# Patient Record
Sex: Male | Born: 1950 | Race: White | Hispanic: No | Marital: Married | Smoking: Former smoker
Health system: Southern US, Community
[De-identification: ages and names within clinical notes are randomized; demographics above are authoritative.]

## PROBLEM LIST (undated history)

## (undated) DIAGNOSIS — E785 Hyperlipidemia, unspecified: Secondary | ICD-10-CM

## (undated) DIAGNOSIS — E119 Type 2 diabetes mellitus without complications: Secondary | ICD-10-CM

## (undated) HISTORY — DX: Hyperlipidemia, unspecified: E78.5

## (undated) HISTORY — DX: Type 2 diabetes mellitus without complications: E11.9

---

## 2002-08-25 ENCOUNTER — Encounter: Admission: RE | Admit: 2002-08-25 | Discharge: 2002-11-23 | Payer: Self-pay | Admitting: Family Medicine

## 2002-11-11 ENCOUNTER — Encounter: Admission: RE | Admit: 2002-11-11 | Discharge: 2003-02-09 | Payer: Self-pay | Admitting: Family Medicine

## 2003-09-06 ENCOUNTER — Encounter: Admission: RE | Admit: 2003-09-06 | Discharge: 2003-12-05 | Payer: Self-pay | Admitting: Family Medicine

## 2004-08-03 ENCOUNTER — Ambulatory Visit (HOSPITAL_COMMUNITY): Admission: RE | Admit: 2004-08-03 | Discharge: 2004-08-03 | Payer: Self-pay | Admitting: Gastroenterology

## 2015-01-02 DIAGNOSIS — Z0000 Encounter for general adult medical examination without abnormal findings: Secondary | ICD-10-CM | POA: Diagnosis not present

## 2015-01-02 DIAGNOSIS — M47817 Spondylosis without myelopathy or radiculopathy, lumbosacral region: Secondary | ICD-10-CM | POA: Diagnosis not present

## 2015-01-02 DIAGNOSIS — M2578 Osteophyte, vertebrae: Secondary | ICD-10-CM | POA: Diagnosis not present

## 2015-01-02 DIAGNOSIS — B070 Plantar wart: Secondary | ICD-10-CM | POA: Diagnosis not present

## 2015-01-02 DIAGNOSIS — Z23 Encounter for immunization: Secondary | ICD-10-CM | POA: Diagnosis not present

## 2015-01-02 DIAGNOSIS — Z125 Encounter for screening for malignant neoplasm of prostate: Secondary | ICD-10-CM | POA: Diagnosis not present

## 2015-01-02 DIAGNOSIS — I10 Essential (primary) hypertension: Secondary | ICD-10-CM | POA: Diagnosis not present

## 2015-01-02 DIAGNOSIS — L989 Disorder of the skin and subcutaneous tissue, unspecified: Secondary | ICD-10-CM | POA: Diagnosis not present

## 2015-01-02 DIAGNOSIS — E780 Pure hypercholesterolemia: Secondary | ICD-10-CM | POA: Diagnosis not present

## 2015-01-02 DIAGNOSIS — M545 Low back pain: Secondary | ICD-10-CM | POA: Diagnosis not present

## 2015-01-10 DIAGNOSIS — H2513 Age-related nuclear cataract, bilateral: Secondary | ICD-10-CM | POA: Diagnosis not present

## 2015-01-10 DIAGNOSIS — H02834 Dermatochalasis of left upper eyelid: Secondary | ICD-10-CM | POA: Diagnosis not present

## 2015-01-10 DIAGNOSIS — H02831 Dermatochalasis of right upper eyelid: Secondary | ICD-10-CM | POA: Diagnosis not present

## 2015-01-10 DIAGNOSIS — E119 Type 2 diabetes mellitus without complications: Secondary | ICD-10-CM | POA: Diagnosis not present

## 2015-01-16 DIAGNOSIS — H02834 Dermatochalasis of left upper eyelid: Secondary | ICD-10-CM | POA: Diagnosis not present

## 2015-01-16 DIAGNOSIS — H02831 Dermatochalasis of right upper eyelid: Secondary | ICD-10-CM | POA: Diagnosis not present

## 2015-01-17 DIAGNOSIS — L820 Inflamed seborrheic keratosis: Secondary | ICD-10-CM | POA: Diagnosis not present

## 2015-01-17 DIAGNOSIS — D485 Neoplasm of uncertain behavior of skin: Secondary | ICD-10-CM | POA: Diagnosis not present

## 2015-01-17 DIAGNOSIS — D235 Other benign neoplasm of skin of trunk: Secondary | ICD-10-CM | POA: Diagnosis not present

## 2015-01-17 DIAGNOSIS — L821 Other seborrheic keratosis: Secondary | ICD-10-CM | POA: Diagnosis not present

## 2015-01-17 DIAGNOSIS — L570 Actinic keratosis: Secondary | ICD-10-CM | POA: Diagnosis not present

## 2015-01-23 DIAGNOSIS — B070 Plantar wart: Secondary | ICD-10-CM | POA: Diagnosis not present

## 2015-01-23 DIAGNOSIS — E1140 Type 2 diabetes mellitus with diabetic neuropathy, unspecified: Secondary | ICD-10-CM | POA: Diagnosis not present

## 2015-02-06 DIAGNOSIS — B070 Plantar wart: Secondary | ICD-10-CM | POA: Diagnosis not present

## 2015-02-10 DIAGNOSIS — H02834 Dermatochalasis of left upper eyelid: Secondary | ICD-10-CM | POA: Diagnosis not present

## 2015-02-10 DIAGNOSIS — H2513 Age-related nuclear cataract, bilateral: Secondary | ICD-10-CM | POA: Diagnosis not present

## 2015-02-10 DIAGNOSIS — H02831 Dermatochalasis of right upper eyelid: Secondary | ICD-10-CM | POA: Diagnosis not present

## 2015-02-15 DIAGNOSIS — M47816 Spondylosis without myelopathy or radiculopathy, lumbar region: Secondary | ICD-10-CM | POA: Diagnosis not present

## 2015-02-15 DIAGNOSIS — M5136 Other intervertebral disc degeneration, lumbar region: Secondary | ICD-10-CM | POA: Diagnosis not present

## 2015-02-15 DIAGNOSIS — M4004 Postural kyphosis, thoracic region: Secondary | ICD-10-CM | POA: Diagnosis not present

## 2015-02-15 DIAGNOSIS — M545 Low back pain: Secondary | ICD-10-CM | POA: Diagnosis not present

## 2015-02-21 DIAGNOSIS — D235 Other benign neoplasm of skin of trunk: Secondary | ICD-10-CM | POA: Diagnosis not present

## 2015-02-28 DIAGNOSIS — Z48817 Encounter for surgical aftercare following surgery on the skin and subcutaneous tissue: Secondary | ICD-10-CM | POA: Diagnosis not present

## 2015-02-28 DIAGNOSIS — D229 Melanocytic nevi, unspecified: Secondary | ICD-10-CM | POA: Diagnosis not present

## 2015-03-02 DIAGNOSIS — Z48817 Encounter for surgical aftercare following surgery on the skin and subcutaneous tissue: Secondary | ICD-10-CM | POA: Diagnosis not present

## 2015-03-07 DIAGNOSIS — E1165 Type 2 diabetes mellitus with hyperglycemia: Secondary | ICD-10-CM | POA: Diagnosis not present

## 2015-03-07 DIAGNOSIS — E108 Type 1 diabetes mellitus with unspecified complications: Secondary | ICD-10-CM | POA: Diagnosis not present

## 2015-03-09 DIAGNOSIS — H02403 Unspecified ptosis of bilateral eyelids: Secondary | ICD-10-CM | POA: Diagnosis not present

## 2015-03-09 DIAGNOSIS — H02834 Dermatochalasis of left upper eyelid: Secondary | ICD-10-CM | POA: Diagnosis not present

## 2015-03-09 DIAGNOSIS — H02831 Dermatochalasis of right upper eyelid: Secondary | ICD-10-CM | POA: Diagnosis not present

## 2015-06-09 DIAGNOSIS — J0100 Acute maxillary sinusitis, unspecified: Secondary | ICD-10-CM | POA: Diagnosis not present

## 2015-06-15 DIAGNOSIS — J302 Other seasonal allergic rhinitis: Secondary | ICD-10-CM | POA: Diagnosis not present

## 2015-09-04 DIAGNOSIS — E1165 Type 2 diabetes mellitus with hyperglycemia: Secondary | ICD-10-CM | POA: Diagnosis not present

## 2015-09-04 DIAGNOSIS — E118 Type 2 diabetes mellitus with unspecified complications: Secondary | ICD-10-CM | POA: Diagnosis not present

## 2015-09-04 DIAGNOSIS — E782 Mixed hyperlipidemia: Secondary | ICD-10-CM | POA: Diagnosis not present

## 2015-09-04 DIAGNOSIS — I10 Essential (primary) hypertension: Secondary | ICD-10-CM | POA: Diagnosis not present

## 2015-09-12 DIAGNOSIS — E118 Type 2 diabetes mellitus with unspecified complications: Secondary | ICD-10-CM | POA: Diagnosis not present

## 2015-09-25 DIAGNOSIS — Z23 Encounter for immunization: Secondary | ICD-10-CM | POA: Diagnosis not present

## 2015-09-26 DIAGNOSIS — Z872 Personal history of diseases of the skin and subcutaneous tissue: Secondary | ICD-10-CM | POA: Diagnosis not present

## 2015-09-26 DIAGNOSIS — Z09 Encounter for follow-up examination after completed treatment for conditions other than malignant neoplasm: Secondary | ICD-10-CM | POA: Diagnosis not present

## 2015-09-26 DIAGNOSIS — L570 Actinic keratosis: Secondary | ICD-10-CM | POA: Diagnosis not present

## 2015-09-26 DIAGNOSIS — X32XXXA Exposure to sunlight, initial encounter: Secondary | ICD-10-CM | POA: Diagnosis not present

## 2015-09-26 DIAGNOSIS — D225 Melanocytic nevi of trunk: Secondary | ICD-10-CM | POA: Diagnosis not present

## 2015-11-30 DIAGNOSIS — J018 Other acute sinusitis: Secondary | ICD-10-CM | POA: Diagnosis not present

## 2015-12-06 DIAGNOSIS — Z794 Long term (current) use of insulin: Secondary | ICD-10-CM | POA: Diagnosis not present

## 2015-12-06 DIAGNOSIS — E118 Type 2 diabetes mellitus with unspecified complications: Secondary | ICD-10-CM | POA: Diagnosis not present

## 2015-12-06 DIAGNOSIS — E1165 Type 2 diabetes mellitus with hyperglycemia: Secondary | ICD-10-CM | POA: Diagnosis not present

## 2016-03-18 DIAGNOSIS — E1165 Type 2 diabetes mellitus with hyperglycemia: Secondary | ICD-10-CM | POA: Diagnosis not present

## 2016-03-18 DIAGNOSIS — I10 Essential (primary) hypertension: Secondary | ICD-10-CM | POA: Diagnosis not present

## 2016-03-18 DIAGNOSIS — E785 Hyperlipidemia, unspecified: Secondary | ICD-10-CM | POA: Diagnosis not present

## 2016-03-18 DIAGNOSIS — Z794 Long term (current) use of insulin: Secondary | ICD-10-CM | POA: Diagnosis not present

## 2016-03-18 DIAGNOSIS — E118 Type 2 diabetes mellitus with unspecified complications: Secondary | ICD-10-CM | POA: Diagnosis not present

## 2016-06-18 DIAGNOSIS — I10 Essential (primary) hypertension: Secondary | ICD-10-CM | POA: Diagnosis not present

## 2016-06-18 DIAGNOSIS — E784 Other hyperlipidemia: Secondary | ICD-10-CM | POA: Diagnosis not present

## 2016-06-18 DIAGNOSIS — Z794 Long term (current) use of insulin: Secondary | ICD-10-CM | POA: Diagnosis not present

## 2016-06-18 DIAGNOSIS — E1165 Type 2 diabetes mellitus with hyperglycemia: Secondary | ICD-10-CM | POA: Diagnosis not present

## 2016-06-18 DIAGNOSIS — E118 Type 2 diabetes mellitus with unspecified complications: Secondary | ICD-10-CM | POA: Diagnosis not present

## 2016-07-29 DIAGNOSIS — M5432 Sciatica, left side: Secondary | ICD-10-CM | POA: Diagnosis not present

## 2016-07-29 DIAGNOSIS — E119 Type 2 diabetes mellitus without complications: Secondary | ICD-10-CM | POA: Diagnosis not present

## 2016-08-01 ENCOUNTER — Encounter: Payer: Self-pay | Admitting: Internal Medicine

## 2016-08-05 DIAGNOSIS — M5432 Sciatica, left side: Secondary | ICD-10-CM | POA: Diagnosis not present

## 2016-08-17 DIAGNOSIS — M5136 Other intervertebral disc degeneration, lumbar region: Secondary | ICD-10-CM | POA: Diagnosis not present

## 2016-08-17 DIAGNOSIS — M5416 Radiculopathy, lumbar region: Secondary | ICD-10-CM | POA: Diagnosis not present

## 2016-09-10 ENCOUNTER — Encounter: Payer: Self-pay | Admitting: Endocrinology

## 2016-09-10 ENCOUNTER — Ambulatory Visit (INDEPENDENT_AMBULATORY_CARE_PROVIDER_SITE_OTHER): Payer: Medicare Other | Admitting: Endocrinology

## 2016-09-10 VITALS — BP 136/62 | HR 87 | Ht 71.0 in | Wt 181.0 lb

## 2016-09-10 DIAGNOSIS — E1142 Type 2 diabetes mellitus with diabetic polyneuropathy: Secondary | ICD-10-CM

## 2016-09-10 DIAGNOSIS — E119 Type 2 diabetes mellitus without complications: Secondary | ICD-10-CM | POA: Insufficient documentation

## 2016-09-10 DIAGNOSIS — Z23 Encounter for immunization: Secondary | ICD-10-CM

## 2016-09-10 LAB — POCT GLYCOSYLATED HEMOGLOBIN (HGB A1C): Hemoglobin A1C: 9.6

## 2016-09-10 MED ORDER — METFORMIN HCL 1000 MG PO TABS: 1000.0000 mg | ORAL_TABLET | Freq: Two times a day (BID) | ORAL | 11 refills | Status: DC

## 2016-09-10 MED ORDER — INVOKANA 300 MG PO TABS: 300.0000 mg | ORAL_TABLET | Freq: Every day | ORAL | 11 refills | Status: DC

## 2016-09-10 MED ORDER — GLYBURIDE 5 MG PO TABS: 10.0000 mg | ORAL_TABLET | Freq: Two times a day (BID) | ORAL | 11 refills | Status: DC

## 2016-09-10 NOTE — Patient Instructions (Addendum)
good diet and exercise significantly improve the control of your diabetes.  please let me know if you wish to be referred to a dietician.  high blood sugar is very risky to your health.  you should see an eye doctor and dentist every year.  It is very important to get all recommended vaccinations.  Controlling your blood pressure and cholesterol drastically reduces the damage diabetes does to your body.  Those who smoke should quit.  Please discuss these with your doctor.  check your blood sugar once a day.  vary the time of day when you check, between before the 3 meals, and at bedtime.  also check if you have symptoms of your blood sugar being too high or too low.  please keep a record of the readings and bring it to your next appointment here (or you can bring the meter itself).  You can write it on any piece of paper.  please call us sooner if your blood sugar goes below 70, or if you have a lot of readings over 200.   Please continue the same medications.   Please come back for a follow-up appointment in 2 months.

## 2016-09-10 NOTE — Progress Notes (Signed)
Subjective:    Patient ID: Nathan Duncan, male    DOB: May 01, 1951, 65 y.o.   MRN: [id number]  HPI pt states DM was dx'ed in 1985; he has mild neuropathy of the lower extremities; he is unaware of any associated chronic complications; he has never been on insulin; pt says his diet and exercise are both poor; he has never had pancreatitis or DKA.  He takes 3 oral meds. He brings a record of his cbg's which I have reviewed today, checked fasting only.  It varies from 148-202.  He has had 1 episode of severe hypoglycemia (1980's)--does not know what med or meds he was taking then.   Past Medical History:  Diagnosis Date  . Diabetes ([place])     No past surgical history on file.  Social History   Social History  . Marital status: Married    Spouse name: N/A  . Number of children: N/A  . Years of education: N/A   Occupational History  . Not on file.   Social History Main Topics  . Smoking status: Former Research scientist (life sciences)  . Smokeless tobacco: Never Used  . Alcohol use Yes  . Drug use: Unknown  . Sexual activity: Not on file   Other Topics Concern  . Not on file   Social History Narrative  . No narrative on file    No current outpatient prescriptions on file prior to visit.   No current facility-administered medications on file prior to visit.     No Known Allergies  Family History  Problem Relation Age of Onset  . Diabetes Mother     BP 136/62   Pulse 87   Ht 5\' 11"  (1.803 m)   Wt 181 lb (82.1 kg)   SpO2 97%   BMI 25.24 kg/m    Review of Systems denies headache, chest pain, sob, n/v, urinary frequency, muscle cramps, excessive diaphoresis, depression, rhinorrhea, and easy bruising.  He has chronic foot pain and mild lightheadedness.  He has lost 50 lbs, x 4 years.  He has mild blurry vision and cold intolerance.      Objective:   Physical Exam VS: see vs page GEN: no distress HEAD: head: no deformity eyes: no periorbital swelling, no proptosis external nose and  ears are normal mouth: no lesion seen NECK: supple, thyroid is not enlarged CHEST WALL: no deformity LUNGS: clear to auscultation CV: reg rate and rhythm, no murmur. ABD: abdomen is soft, nontender.  no hepatosplenomegaly.  not distended.  no hernia MUSCULOSKELETAL: muscle bulk and strength are grossly normal.  no obvious joint swelling.  gait is normal and steady EXTEMITIES: no deformity.  no ulcer on the feet.  feet are of normal color and temp.  no edema PULSES: dorsalis pedis intact bilat.  no carotid bruit NEURO:  cn 2-12 grossly intact.   readily moves all 4's.  sensation is intact to touch on the feet.   SKIN:  Normal texture and temperature.  No rash or suspicious lesion is visible.   NODES:  None palpable at the neck.   PSYCH: alert, well-oriented.  Does not appear anxious nor depressed.    A1c=9.6%     Assessment & Plan:  Type 2 DM, severe exacerbation (although he may be developing type 1).  He declines to start insulin, learn about insulin, or add another oral med today.   Weight loss, new to me: prob due to chronic hyperglycemia: we'll follow Blurry vision: prob due to hyperglycemia: he is advised to  see opthal.

## 2016-09-23 DIAGNOSIS — H524 Presbyopia: Secondary | ICD-10-CM | POA: Diagnosis not present

## 2016-09-23 DIAGNOSIS — H52223 Regular astigmatism, bilateral: Secondary | ICD-10-CM | POA: Diagnosis not present

## 2016-09-23 DIAGNOSIS — H5213 Myopia, bilateral: Secondary | ICD-10-CM | POA: Diagnosis not present

## 2016-09-23 DIAGNOSIS — E103293 Type 1 diabetes mellitus with mild nonproliferative diabetic retinopathy without macular edema, bilateral: Secondary | ICD-10-CM | POA: Diagnosis not present

## 2016-09-23 DIAGNOSIS — H2513 Age-related nuclear cataract, bilateral: Secondary | ICD-10-CM | POA: Diagnosis not present

## 2016-09-23 DIAGNOSIS — H02831 Dermatochalasis of right upper eyelid: Secondary | ICD-10-CM | POA: Diagnosis not present

## 2016-10-07 ENCOUNTER — Encounter: Payer: Self-pay | Admitting: Endocrinology

## 2016-10-07 ENCOUNTER — Other Ambulatory Visit: Payer: Self-pay

## 2016-10-07 MED ORDER — INVOKANA 300 MG PO TABS: 300.0000 mg | ORAL_TABLET | Freq: Every day | ORAL | 1 refills | Status: DC

## 2016-10-07 MED ORDER — GLYBURIDE 5 MG PO TABS: 10.0000 mg | ORAL_TABLET | Freq: Two times a day (BID) | ORAL | 1 refills | Status: DC

## 2016-11-07 ENCOUNTER — Ambulatory Visit (INDEPENDENT_AMBULATORY_CARE_PROVIDER_SITE_OTHER): Payer: Medicare Other | Admitting: Endocrinology

## 2016-11-07 ENCOUNTER — Encounter: Payer: Self-pay | Admitting: Endocrinology

## 2016-11-07 ENCOUNTER — Encounter: Payer: Self-pay | Admitting: Dietician

## 2016-11-07 ENCOUNTER — Encounter: Payer: Medicare Other | Attending: Endocrinology | Admitting: Dietician

## 2016-11-07 VITALS — BP 130/70 | HR 68 | Ht 71.0 in | Wt 180.0 lb

## 2016-11-07 DIAGNOSIS — Z713 Dietary counseling and surveillance: Secondary | ICD-10-CM | POA: Insufficient documentation

## 2016-11-07 DIAGNOSIS — E1142 Type 2 diabetes mellitus with diabetic polyneuropathy: Secondary | ICD-10-CM | POA: Diagnosis not present

## 2016-11-07 LAB — POCT GLYCOSYLATED HEMOGLOBIN (HGB A1C): HEMOGLOBIN A1C: 8.4

## 2016-11-07 NOTE — Patient Instructions (Signed)
Avoid skipping meals. Consider a small snack when you are working in the yard to avoid lows. When you are having a low blood sugar, avoid over treating.  Start with 15 grams of carbohydrates (1/2 cup juice or 2 Tablespoons raisins or 1/2 cup regular soda) then recheck in 15 minutes.  If it is still low then repeat.  When normal then have a snack with protein.  Plan:  Aim for 4 Carb Choices per meal (60 grams) +/- 1 either way  Aim for 0-1 Carbs per snack if hungry  Include protein in moderation with your meals and snacks Consider reading food labels for Total Carbohydrate and Fat Grams of foods Consider  increasing your activity level by walking for 30-60 minutes daily as tolerated Consider checking BG at alternate times per day as directed by MD  Consider taking medication as directed by MD

## 2016-11-07 NOTE — Progress Notes (Signed)
Medical Nutrition Therapy:  Appt start time: 0930 end time:  1050.   Assessment:  Primary concerns today: Patient is here alone.  He is a patient of Dr. Cordelia Pen referred for Type 2 diabetes.  He has had diabetes since he was 89 years ole.  Weight today 180 lbs which is stable and decreased from 245 lbs in the past 1 1/2 years with exercise and diet changes.  His last HgbA1C was 8.4% today which was decreased from 9.6%. 09/10/16.  Patient lives with his wife.  They moved here from [place] 4 months ago.  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  He does the shopping and cooking.    Preferred Learning Style:   No preference indicated   Learning Readiness:   Ready  MEDICATIONS: see list to include gliburide, invokana, and metformin.  He is trying to avoid insulin.   DIETARY INTAKE:  Usual eating pattern includes 3 meals and 2-3 snacks per day.  Everyday foods include stevia.  Avoided foods include sugar.    24-hr recall:  B ( AM): plain cheerios, banana or craisins or grapes, 2% milk OR scrambled eggs, sausage link, white toast or low carb tortilla  OR bagel with cream cheese and salmon Snk ( AM): none OR payday candy bar when he is working outside  L ( PM): sandwich or soup and salad Snk ( PM): water crackers (6) with gouda and herring D ( PM): mixed vegetables and shrimp OR baked or grilled fish, pork chop with salad OR pasta, [place] meatballs, brown gravy Snk ( PM): cottage cheese and fruit, 1 chocolate nugget OR raisinettes  Beverages: water, tea with honey, unsweetened tea, 2 cups 2% milk, coffee  Usual physical activity: working on a lot of projects including a lot of yard work but no formal exercise  Estimated energy needs: 2000 calories 225 g carbohydrates 125 g protein 67 g fat  Progress Towards Goal(s):  In progress.   Nutritional Diagnosis:  NB-1.1 Food and nutrition-related knowledge deficit As related to  balance of carbohydrate, protein, and fat.  As evidenced by patient report and diet hx.    Intervention:  Nutrition counseling/education related to a diabetic diet.  Discussed balance of carbohydrates throughout the day and adding protein to meals and snacks.  Discussed exercise, effects on blood sugar and importance of a snack and not skipping meals to avoid the low and other treatments to avoid over treating the low. Discussed benefits of a varied diet for increased nutrients and specifically need to increase his non starchy vegetable intake.  Avoid skipping meals. Consider a small snack when you are working in the yard to avoid lows. When you are having a low blood sugar, avoid over treating.  Start with 15 grams of carbohydrates (1/2 cup juice or 2 Tablespoons raisins or 1/2 cup regular soda) then recheck in 15 minutes.  If it is still low then repeat.  When normal then have a snack with protein.  Reviewed label reading.  Plan:  Aim for 4 Carb Choices per meal (60 grams) +/- 1 either way  Aim for 0-1 Carbs per snack if hungry  Include protein in moderation with your meals and snacks Consider reading food labels for Total Carbohydrate and Fat Grams of foods Consider  increasing your activity level by walking for 30-60 minutes daily as tolerated Consider checking BG at alternate times per day as directed by MD  Consider taking  medication as directed by MD  Teaching Method Utilized:  Visual Auditory Hands on  Handouts given during visit include:  Living Well with Diabetes  A1C sheet  Meal plan card  Snack list  Barriers to learning/adherence to lifestyle change: none  Demonstrated degree of understanding via:  Teach Back   Monitoring/Evaluation:  Dietary intake, exercise, label reading, and body weight prn.

## 2016-11-07 NOTE — Patient Instructions (Addendum)
check your blood sugar once a day.  vary the time of day when you check, between before the 3 meals, and at bedtime.  also check if you have symptoms of your blood sugar being too high or too low.  please keep a record of the readings and bring it to your next appointment here (or you can bring the meter itself).  You can write it on any piece of paper.  please call us sooner if your blood sugar goes below 70, or if you have a lot of readings over 200.   Please continue the same medications.   Please come back for a follow-up appointment in 3 months.

## 2016-11-07 NOTE — Progress Notes (Signed)
Subjective:    Patient ID: Nathan Duncan, male    DOB: May 10, 1951, 65 y.o.   MRN: [id number]  HPI Pt returns for f/u of diabetes mellitus: DM type: 2 (although he may be developing type 1) Dx'ed: 1985. Complications: none. Therapy: 3 oral meds. DKA: never.  Severe hypoglycemia: once, in the 1980's--he does not know what med or meds he was taking then.  Pancreatitis: never.  Other: he has never been on insulin, and he declines to take.  Interval history: no cbg record, but states cbg's are in the mid-100's.  pt states he feels well in general.  He takes meds as rx'ed.  Past Medical History:  Diagnosis Date  . Diabetes ([place])     No past surgical history on file.  Social History   Social History  . Marital status: Married    Spouse name: N/A  . Number of children: N/A  . Years of education: N/A   Occupational History  . Not on file.   Social History Main Topics  . Smoking status: Former Research scientist (life sciences)  . Smokeless tobacco: Never Used  . Alcohol use Yes  . Drug use: Unknown  . Sexual activity: Not on file   Other Topics Concern  . Not on file   Social History Narrative  . No narrative on file    Current Outpatient Prescriptions on File Prior to Visit  Medication Sig Dispense Refill  . ACCU-CHEK SMARTVIEW test strip     . aspirin EC 81 MG tablet Take 81 mg by mouth daily.    Marland Kitchen atorvastatin (LIPITOR) 40 MG tablet     . glyBURIDE (DIABETA) 5 MG tablet Take 2 tablets (10 mg total) by mouth 2 (two) times daily with a meal. 360 tablet 1  . INVOKANA 300 MG TABS tablet Take 1 tablet (300 mg total) by mouth daily with breakfast. 30 tablet 1  . lisinopril (PRINIVIL,ZESTRIL) 5 MG tablet     . metFORMIN (GLUCOPHAGE) 1000 MG tablet Take 1 tablet (1,000 mg total) by mouth 2 (two) times daily with a meal. 60 tablet 11   No current facility-administered medications on file prior to visit.     No Known Allergies  Family History  Problem Relation Age of Onset  . Diabetes  Mother     BP 130/70   Pulse 68   Ht 5\' 11"  (1.803 m)   Wt 180 lb (81.6 kg)   SpO2 97%   BMI 25.10 kg/m    Review of Systems He has lost 1 more lb since last ov.     Objective:   Physical Exam VITAL SIGNS:  See vs page GENERAL: no distress Pulses: dorsalis pedis intact bilat.   MSK: no deformity of the feet.  CV: no leg edema.  Skin:  no ulcer on the feet.  normal color and temp on the feet.  Neuro: sensation is intact to touch on the feet.   A1c=8.4%    Assessment & Plan:  Type 2 DM: he needs increased rx.  He declines to take insulin, or to add another med.    Patient is advised the following: Patient Instructions  check your blood sugar once a day.  vary the time of day when you check, between before the 3 meals, and at bedtime.  also check if you have symptoms of your blood sugar being too high or too low.  please keep a record of the readings and bring it to your next appointment here (or you can  bring the meter itself).  You can write it on any piece of paper.  please call us sooner if your blood sugar goes below 70, or if you have a lot of readings over 200.   Please continue the same medications.   Please come back for a follow-up appointment in 3 months.

## 2016-11-26 ENCOUNTER — Encounter: Payer: Self-pay | Admitting: Endocrinology

## 2016-11-26 ENCOUNTER — Other Ambulatory Visit: Payer: Self-pay

## 2016-11-26 MED ORDER — ACCU-CHEK SMARTVIEW VI STRP: ORAL_STRIP | 2 refills | Status: DC

## 2017-02-07 ENCOUNTER — Ambulatory Visit: Payer: Medicare Other | Admitting: Endocrinology

## 2017-02-12 DIAGNOSIS — L820 Inflamed seborrheic keratosis: Secondary | ICD-10-CM | POA: Diagnosis not present

## 2017-02-12 DIAGNOSIS — L821 Other seborrheic keratosis: Secondary | ICD-10-CM | POA: Diagnosis not present

## 2017-02-12 DIAGNOSIS — D2222 Melanocytic nevi of left ear and external auricular canal: Secondary | ICD-10-CM | POA: Diagnosis not present

## 2017-02-19 ENCOUNTER — Other Ambulatory Visit: Payer: Self-pay

## 2017-02-19 MED ORDER — INVOKANA 300 MG PO TABS: 300.0000 mg | ORAL_TABLET | Freq: Every day | ORAL | 4 refills | Status: DC

## 2017-02-26 ENCOUNTER — Encounter: Payer: Self-pay | Admitting: Endocrinology

## 2017-02-28 ENCOUNTER — Telehealth: Payer: Self-pay | Admitting: Endocrinology

## 2017-02-28 ENCOUNTER — Encounter: Payer: Self-pay | Admitting: Endocrinology

## 2017-02-28 DIAGNOSIS — E1142 Type 2 diabetes mellitus with diabetic polyneuropathy: Secondary | ICD-10-CM

## 2017-02-28 NOTE — Telephone Encounter (Signed)
please call patient: We need a kidney blood test in order to get invokana approved.  I have ordered. Please come in soon.

## 2017-03-03 NOTE — Telephone Encounter (Signed)
I contacted the patient and advised of message. Patient agreed and scheduled for a an appointment tomorrow.

## 2017-03-04 ENCOUNTER — Ambulatory Visit: Payer: Medicare Other | Admitting: Endocrinology

## 2017-03-05 ENCOUNTER — Encounter: Payer: Self-pay | Admitting: Endocrinology

## 2017-03-07 ENCOUNTER — Ambulatory Visit: Payer: Federal, State, Local not specified - PPO

## 2017-03-07 DIAGNOSIS — E1142 Type 2 diabetes mellitus with diabetic polyneuropathy: Secondary | ICD-10-CM

## 2017-03-07 LAB — BASIC METABOLIC PANEL
BUN: 26 mg/dL — AB (ref 6–23)
CHLORIDE: 101 meq/L (ref 96–112)
CO2: 24 mEq/L (ref 19–32)
Calcium: 9.6 mg/dL (ref 8.4–10.5)
Creatinine, Ser: 1.13 mg/dL (ref 0.40–1.50)
GFR: 68.56 mL/min (ref >60.00)
Glucose, Bld: 187 mg/dL — ABNORMAL HIGH (ref 70–99)
POTASSIUM: 4.2 meq/L (ref 3.5–5.1)
Sodium: 132 mEq/L — ABNORMAL LOW (ref 135–145)

## 2017-03-11 ENCOUNTER — Encounter: Payer: Self-pay | Admitting: Endocrinology

## 2017-03-20 ENCOUNTER — Ambulatory Visit (INDEPENDENT_AMBULATORY_CARE_PROVIDER_SITE_OTHER): Payer: Medicare Other | Admitting: Endocrinology

## 2017-03-20 ENCOUNTER — Encounter: Payer: Self-pay | Admitting: Endocrinology

## 2017-03-20 VITALS — BP 122/78 | HR 66 | Ht 71.0 in | Wt 188.0 lb

## 2017-03-20 DIAGNOSIS — E1142 Type 2 diabetes mellitus with diabetic polyneuropathy: Secondary | ICD-10-CM

## 2017-03-20 LAB — POCT GLYCOSYLATED HEMOGLOBIN (HGB A1C): Hemoglobin A1C: 8.2

## 2017-03-20 NOTE — Progress Notes (Signed)
Subjective:    Patient ID: Nathan Duncan, male    DOB: 1951/05/17, 66 y.o.   MRN: [id number]  HPI Pt returns for f/u of diabetes mellitus: DM type: 2 (although he may be developing type 1) Dx'ed: 1985. Complications: none. Therapy: 3 oral meds.  DKA: never.  Severe hypoglycemia: once, in the 1980's--he does not know what med or meds he was taking then.  Pancreatitis: never.  Other: he has never been on insulin, and he declines to take; he also declines to add other oral meds.  Interval history: He brings a record of his cbg's which I have reviewed today.  It varies from 200.  pt states he feels well in general.  He takes meds as rx'ed.  He missed 1 week of invokana, due to insurance.   Past Medical History:  Diagnosis Date  . Diabetes ([place])     No past surgical history on file.  Social History   Social History  . Marital status: Married    Spouse name: N/A  . Number of children: N/A  . Years of education: N/A   Occupational History  . Not on file.   Social History Main Topics  . Smoking status: Former Research scientist (life sciences)  . Smokeless tobacco: Never Used  . Alcohol use Yes  . Drug use: Unknown  . Sexual activity: Not on file   Other Topics Concern  . Not on file   Social History Narrative  . No narrative on file    Current Outpatient Prescriptions on File Prior to Visit  Medication Sig Dispense Refill  . ACCU-CHEK SMARTVIEW test strip Use to check blood sugar 4 times per day. 200 each 2  . aspirin EC 81 MG tablet Take 81 mg by mouth daily.    Marland Kitchen atorvastatin (LIPITOR) 40 MG tablet Take 40 mg by mouth daily.     Marland Kitchen glyBURIDE (DIABETA) 5 MG tablet Take 2 tablets (10 mg total) by mouth 2 (two) times daily with a meal. 360 tablet 1  . INVOKANA 300 MG TABS tablet Take 1 tablet (300 mg total) by mouth daily with breakfast. 30 tablet 4  . lisinopril (PRINIVIL,ZESTRIL) 5 MG tablet     . metFORMIN (GLUCOPHAGE) 1000 MG tablet Take 1 tablet (1,000 mg total) by mouth 2 (two) times  daily with a meal. (Patient taking differently: Take 1,000 mg by mouth 2 (two) times daily with a meal. 2 1/2 tabs daily) 60 tablet 11   No current facility-administered medications on file prior to visit.     No Known Allergies  Family History  Problem Relation Age of Onset  . Diabetes Mother     BP 122/78   Pulse 66   Ht 5\' 11"  (1.803 m)   Wt 188 lb (85.3 kg)   SpO2 97%   BMI 26.22 kg/m   Review of Systems He denies hypoglycemia.      Objective:   Physical Exam VITAL SIGNS:  See vs page.  GENERAL: no distress. Pulses: dorsalis pedis intact bilat.   MSK: no deformity of the feet. CV: no leg edema. Skin:  no ulcer on the feet.  normal color and temp on the feet.  Neuro: sensation is intact to touch on the feet.   a1c=8.2%    Assessment & Plan:  Type 2 DM: he needs increased rx.  He declines to take insulin, or to add another med.  We discussed risks.   Patient Instructions  check your blood sugar once a day.  vary the time of day when you check, between before the 3 meals, and at bedtime.  also check if you have symptoms of your blood sugar being too high or too low.  please keep a record of the readings and bring it to your next appointment here (or you can bring the meter itself).  You can write it on any piece of paper.  please call us sooner if your blood sugar goes below 70, or if you have a lot of readings over 200.   Please continue the same medications.  Please let us know if you agree to add another medication, or to take insulin.  Please come back for a follow-up appointment in 4 months.

## 2017-03-20 NOTE — Patient Instructions (Addendum)
check your blood sugar once a day.  vary the time of day when you check, between before the 3 meals, and at bedtime.  also check if you have symptoms of your blood sugar being too high or too low.  please keep a record of the readings and bring it to your next appointment here (or you can bring the meter itself).  You can write it on any piece of paper.  please call us sooner if your blood sugar goes below 70, or if you have a lot of readings over 200.   Please continue the same medications.  Please let us know if you agree to add another medication, or to take insulin.  Please come back for a follow-up appointment in 4 months.

## 2017-06-20 ENCOUNTER — Ambulatory Visit: Payer: Medicare Other | Admitting: Endocrinology

## 2017-07-16 ENCOUNTER — Encounter: Payer: Self-pay | Admitting: Endocrinology

## 2017-07-16 ENCOUNTER — Ambulatory Visit (INDEPENDENT_AMBULATORY_CARE_PROVIDER_SITE_OTHER): Payer: Medicare Other | Admitting: Endocrinology

## 2017-07-16 VITALS — BP 138/72 | HR 78 | Wt 178.8 lb

## 2017-07-16 DIAGNOSIS — E1142 Type 2 diabetes mellitus with diabetic polyneuropathy: Secondary | ICD-10-CM

## 2017-07-16 LAB — POCT GLYCOSYLATED HEMOGLOBIN (HGB A1C): HEMOGLOBIN A1C: 8.9

## 2017-07-16 MED ORDER — METFORMIN HCL ER 500 MG PO TB24: 2500.0000 mg | ORAL_TABLET | Freq: Every day | ORAL | 11 refills | Status: DC

## 2017-07-16 NOTE — Progress Notes (Signed)
Subjective:    Patient ID: Nathan Duncan, male    DOB: Jun 28, 1951, 66 y.o.   MRN: [id number]  HPI Pt returns for f/u of diabetes mellitus: DM type: 2 (although he may be developing type 1) Dx'ed: 1985. Complications: polyneuropathy Therapy: 3 oral meds.  DKA: never.  Severe hypoglycemia: once, in the 1980's--he does not know what med or meds he was taking then.  Pancreatitis: never.  Other: he has never been on insulin, and he declines to take; he also declines to add other oral meds.   Interval history: pt states he feels well in general.  He does not check cbg's.   Past Medical History:  Diagnosis Date  . Diabetes ([place])     No past surgical history on file.  Social History   Social History  . Marital status: Married    Spouse name: N/A  . Number of children: N/A  . Years of education: N/A   Occupational History  . Not on file.   Social History Main Topics  . Smoking status: Former Research scientist (life sciences)  . Smokeless tobacco: Never Used  . Alcohol use Yes  . Drug use: Unknown  . Sexual activity: Not on file   Other Topics Concern  . Not on file   Social History Narrative  . No narrative on file    Current Outpatient Prescriptions on File Prior to Visit  Medication Sig Dispense Refill  . ACCU-CHEK SMARTVIEW test strip Use to check blood sugar 4 times per day. 200 each 2  . aspirin EC 81 MG tablet Take 81 mg by mouth daily.    Marland Kitchen atorvastatin (LIPITOR) 40 MG tablet Take 40 mg by mouth daily.     Marland Kitchen glyBURIDE (DIABETA) 5 MG tablet Take 2 tablets (10 mg total) by mouth 2 (two) times daily with a meal. 360 tablet 1  . INVOKANA 300 MG TABS tablet Take 1 tablet (300 mg total) by mouth daily with breakfast. 30 tablet 4  . lisinopril (PRINIVIL,ZESTRIL) 5 MG tablet      No current facility-administered medications on file prior to visit.     No Known Allergies  Family History  Problem Relation Age of Onset  . Diabetes Mother     BP 138/72   Pulse 78   Wt 178 lb 12.8 oz  (81.1 kg)   SpO2 98%   BMI 24.94 kg/m    Review of Systems He denies hypoglycemia.  He says metformin causes nausea    Objective:   Physical Exam VITAL SIGNS:  See vs page GENERAL: no distress Pulses: foot pulses are intact bilaterally.   MSK: no deformity of the feet or ankles.  CV: no edema of the legs or ankles Skin:  no ulcer on the feet or ankles.  normal color and temp on the feet and ankles Neuro: sensation is intact to touch on the feet and ankles.    A1c=8.9%     Assessment & Plan:  Type 2 DM: worse.  We discussed.  He declines to add another oral med, or to learn how to take insulin.  Nausea: new, prob due to metformin.    Patient Instructions  check your blood sugar once a day.  vary the time of day when you check, between before the 3 meals, and at bedtime.  also check if you have symptoms of your blood sugar being too high or too low.  please keep a record of the readings and bring it to your next appointment here (  or you can bring the meter itself).  You can write it on any piece of paper.  please call us sooner if your blood sugar goes below 70, or if you have a lot of readings over 200.   Please continue the same medications.  Please let us know if you agree to add another medication, or to take insulin.  I have sent a prescription to your pharmacy, to change metfomin to extended-release Please come back for a follow-up appointment in 4 months.

## 2017-07-16 NOTE — Patient Instructions (Addendum)
check your blood sugar once a day.  vary the time of day when you check, between before the 3 meals, and at bedtime.  also check if you have symptoms of your blood sugar being too high or too low.  please keep a record of the readings and bring it to your next appointment here (or you can bring the meter itself).  You can write it on any piece of paper.  please call us sooner if your blood sugar goes below 70, or if you have a lot of readings over 200.   Please continue the same medications.  Please let us know if you agree to add another medication, or to take insulin.  I have sent a prescription to your pharmacy, to change metfomin to extended-release Please come back for a follow-up appointment in 4 months.

## 2017-08-08 ENCOUNTER — Encounter: Payer: Self-pay | Admitting: Endocrinology

## 2017-09-17 ENCOUNTER — Encounter: Payer: Self-pay | Admitting: Endocrinology

## 2017-09-18 ENCOUNTER — Other Ambulatory Visit: Payer: Self-pay | Admitting: Endocrinology

## 2017-09-22 DIAGNOSIS — Z23 Encounter for immunization: Secondary | ICD-10-CM | POA: Diagnosis not present

## 2017-09-23 ENCOUNTER — Other Ambulatory Visit: Payer: Self-pay

## 2017-09-23 DIAGNOSIS — H5213 Myopia, bilateral: Secondary | ICD-10-CM | POA: Diagnosis not present

## 2017-09-23 DIAGNOSIS — H2513 Age-related nuclear cataract, bilateral: Secondary | ICD-10-CM | POA: Diagnosis not present

## 2017-09-23 DIAGNOSIS — H524 Presbyopia: Secondary | ICD-10-CM | POA: Diagnosis not present

## 2017-09-23 DIAGNOSIS — H02834 Dermatochalasis of left upper eyelid: Secondary | ICD-10-CM | POA: Diagnosis not present

## 2017-09-23 DIAGNOSIS — E119 Type 2 diabetes mellitus without complications: Secondary | ICD-10-CM | POA: Diagnosis not present

## 2017-09-23 DIAGNOSIS — H52223 Regular astigmatism, bilateral: Secondary | ICD-10-CM | POA: Diagnosis not present

## 2017-09-23 DIAGNOSIS — H02831 Dermatochalasis of right upper eyelid: Secondary | ICD-10-CM | POA: Diagnosis not present

## 2017-09-23 MED ORDER — ACCU-CHEK NANO SMARTVIEW W/DEVICE KIT: 1.0000 | PACK | Freq: Every day | 0 refills | Status: DC

## 2017-10-05 ENCOUNTER — Other Ambulatory Visit: Payer: Self-pay | Admitting: Endocrinology

## 2017-11-17 ENCOUNTER — Ambulatory Visit: Payer: Medicare Other | Admitting: Endocrinology

## 2017-12-09 ENCOUNTER — Ambulatory Visit: Payer: Medicare Other | Admitting: Endocrinology

## 2017-12-26 ENCOUNTER — Ambulatory Visit (INDEPENDENT_AMBULATORY_CARE_PROVIDER_SITE_OTHER): Payer: Medicare Other | Admitting: Endocrinology

## 2017-12-26 ENCOUNTER — Encounter: Payer: Self-pay | Admitting: Endocrinology

## 2017-12-26 VITALS — BP 122/82 | HR 71 | Wt 186.8 lb

## 2017-12-26 DIAGNOSIS — E1142 Type 2 diabetes mellitus with diabetic polyneuropathy: Secondary | ICD-10-CM | POA: Diagnosis not present

## 2017-12-26 LAB — POCT GLYCOSYLATED HEMOGLOBIN (HGB A1C): Hemoglobin A1C: 8.9

## 2017-12-26 NOTE — Patient Instructions (Addendum)
check your blood sugar once a day.  vary the time of day when you check, between before the 3 meals, and at bedtime.  also check if you have symptoms of your blood sugar being too high or too low.  please keep a record of the readings and bring it to your next appointment here (or you can bring the meter itself).  You can write it on any piece of paper.  please call us sooner if your blood sugar goes below 70, or if you have a lot of readings over 200.   Please see Vaughan Basta, to learn about insulin.  Please let us know if you decide to take it.   Please come back for a follow-up appointment in 3 months.

## 2017-12-26 NOTE — Progress Notes (Signed)
Subjective:    Patient ID: Nathan Duncan, male    DOB: 1951/03/28, 67 y.o.   MRN: [id number]  HPI Pt returns for f/u of diabetes mellitus: DM type: 2 (although he may be developing type 1).  Dx'ed: 1448. Complications: polyneuropathy.   Therapy: 3 oral meds.  DKA: never.  Severe hypoglycemia: once, in the 1980's--he does not know what med or meds he was taking then.  Pancreatitis: never.  Other: he has never been on insulin, and he declines to take; he also declines to add other oral meds.   Interval history: pt states he feels well in general.  He does not check cbg's. Past Medical History:  Diagnosis Date  . Diabetes ([place])     History reviewed. No pertinent surgical history.  Social History   Socioeconomic History  . Marital status: Married    Spouse name: Not on file  . Number of children: Not on file  . Years of education: Not on file  . Highest education level: Not on file  Social Needs  . Financial resource strain: Not on file  . Food insecurity - worry: Not on file  . Food insecurity - inability: Not on file  . Transportation needs - medical: Not on file  . Transportation needs - non-medical: Not on file  Occupational History  . Not on file  Tobacco Use  . Smoking status: Former Research scientist (life sciences)  . Smokeless tobacco: Never Used  Substance and Sexual Activity  . Alcohol use: Yes  . Drug use: Not on file  . Sexual activity: Not on file  Other Topics Concern  . Not on file  Social History Narrative  . Not on file    Current Outpatient Medications on File Prior to Visit  Medication Sig Dispense Refill  . ACCU-CHEK SMARTVIEW test strip Use to check blood sugar 4 times per day. 200 each 2  . aspirin EC 81 MG tablet Take 81 mg by mouth daily.    Marland Kitchen atorvastatin (LIPITOR) 40 MG tablet Take 40 mg by mouth daily.     . Blood Glucose Monitoring Suppl (ACCU-CHEK NANO SMARTVIEW) w/Device KIT 1 each by Does not apply route daily. 1 kit 0  . glyBURIDE (DIABETA) 5 MG tablet  Take 2 tablets (10 mg total) by mouth 2 (two) times daily with a meal. 360 tablet 1  . INVOKANA 300 MG TABS tablet Take 1 tablet (300 mg total) by mouth daily with breakfast. 30 tablet 4  . lisinopril (PRINIVIL,ZESTRIL) 5 MG tablet     . metFORMIN (GLUCOPHAGE) 1000 MG tablet TAKE ONE TABLET BY MOUTH TWICE DAILY WITH A MEAL 60 tablet 11   No current facility-administered medications on file prior to visit.     No Known Allergies  Family History  Problem Relation Age of Onset  . Diabetes Mother     BP 122/82 (BP Location: Left Arm, Patient Position: Sitting, Cuff Size: Normal)   Pulse 71   Wt 186 lb 12.8 oz (84.7 kg)   SpO2 96%   BMI 26.05 kg/m   Review of Systems He denies hypoglycemia    Objective:   Physical Exam VITAL SIGNS:  See vs page GENERAL: no distress Pulses: dorsalis pedis intact bilat.   MSK: no deformity of the feet CV: no leg edema Skin:  no ulcer on the feet.  normal color and temp on the feet. Neuro: sensation is intact to touch on the feet.     A1c=8.9%    Assessment & Plan:  Type 2 DM, with polyneuropathy: Ongoing poor control.  We discussed: he agrees to take insulin.    Patient Instructions  check your blood sugar once a day.  vary the time of day when you check, between before the 3 meals, and at bedtime.  also check if you have symptoms of your blood sugar being too high or too low.  please keep a record of the readings and bring it to your next appointment here (or you can bring the meter itself).  You can write it on any piece of paper.  please call us sooner if your blood sugar goes below 70, or if you have a lot of readings over 200.   Please see Vaughan Basta, to learn about insulin.  Please let us know if you decide to take it.   Please come back for a follow-up appointment in 3 months.

## 2018-01-07 ENCOUNTER — Encounter: Payer: Medicare Other | Attending: Endocrinology | Admitting: Nutrition

## 2018-01-07 DIAGNOSIS — E1142 Type 2 diabetes mellitus with diabetic polyneuropathy: Secondary | ICD-10-CM | POA: Diagnosis not present

## 2018-01-07 DIAGNOSIS — E119 Type 2 diabetes mellitus without complications: Secondary | ICD-10-CM

## 2018-01-09 NOTE — Patient Instructions (Signed)
Stop the sweet drinks, syrup, and fruit juice.  limit milk to no more than 8 ounces Walk briskly--working up to 45 min. 5 days/wk Test blood sugar before and 2hr. After one meal each day, if if high, write down what was eaten. Call me in 2 weeks.

## 2018-01-09 NOTE — Progress Notes (Signed)
Patient reports that he does not want to start insulin, and that he will do "whatever it takes to stay off insulin".  He reports that he has not been exercising, and has put on "a few pounds", and will work to loose the weight and start exercising again. We discussed the course of Type II diabetes,and the fact that he may not have enough insulin, and what he needs to do to see if he has enough insulin.  He has agreed to pick one meal each day and test ac and 2 hours pc, and if the readings are over 180-200, he will begin insulin therapy. He was shown how to take the insulin and he reported good understanding of this. We also discussed why his blood sugars are high, and what he needs to do, to decrease his insulin resistance and get better blood sugar control.  He reported good understanding of this, and had no questions He did not bring his machine, but says his bg readings are around "180" when he gets up. Typical day: 2-3AM: coffee with cream 9AM: bfast:  2 eggs, with sausage and 2 toast, milk, or 1/2 waffel with syrup, mil, and maybe orange juice,  12-2PM: hot dog, chips, or beanny weanys with milk-8 ounces, or dandwhich with chips.  If out, will eat fast food with french fries 5-7PM: Baked, or grilled fish or chicken, 1 starchy veg., and 2 non starchy veg., not bread, or corn dog with tater tots,a nd milk to drink  9PM: apple, or cottage cheese with celley, 1/2 glass of gingerale  Suggestions given: Stop the sweet drinks, syrup, and fruit juice.  limit milk to no more than 8 ounces Walk briskly--working up to 45 min. 5 days/wk Test blood sugar before and 2hr. After one meal each day, if if high, write down what was eaten. Call me in 2 weeks. He agreed to do all of the above

## 2018-01-26 DIAGNOSIS — R05 Cough: Secondary | ICD-10-CM | POA: Diagnosis not present

## 2018-01-26 DIAGNOSIS — Z1211 Encounter for screening for malignant neoplasm of colon: Secondary | ICD-10-CM | POA: Diagnosis not present

## 2018-01-26 DIAGNOSIS — E782 Mixed hyperlipidemia: Secondary | ICD-10-CM | POA: Diagnosis not present

## 2018-01-26 DIAGNOSIS — Z87891 Personal history of nicotine dependence: Secondary | ICD-10-CM | POA: Diagnosis not present

## 2018-01-26 DIAGNOSIS — E119 Type 2 diabetes mellitus without complications: Secondary | ICD-10-CM | POA: Diagnosis not present

## 2018-01-26 DIAGNOSIS — Z0000 Encounter for general adult medical examination without abnormal findings: Secondary | ICD-10-CM | POA: Diagnosis not present

## 2018-01-26 DIAGNOSIS — I10 Essential (primary) hypertension: Secondary | ICD-10-CM | POA: Diagnosis not present

## 2018-01-26 DIAGNOSIS — Z7984 Long term (current) use of oral hypoglycemic drugs: Secondary | ICD-10-CM | POA: Diagnosis not present

## 2018-01-26 DIAGNOSIS — Z125 Encounter for screening for malignant neoplasm of prostate: Secondary | ICD-10-CM | POA: Diagnosis not present

## 2018-01-28 ENCOUNTER — Other Ambulatory Visit: Payer: Self-pay | Admitting: Family Medicine

## 2018-01-28 DIAGNOSIS — R059 Cough, unspecified: Secondary | ICD-10-CM

## 2018-01-28 DIAGNOSIS — R05 Cough: Secondary | ICD-10-CM

## 2018-01-28 DIAGNOSIS — Z87891 Personal history of nicotine dependence: Secondary | ICD-10-CM

## 2018-02-04 ENCOUNTER — Ambulatory Visit
Admission: RE | Admit: 2018-02-04 | Discharge: 2018-02-04 | Disposition: A | Discharge status: Home / Self Care | Payer: Medicare Other | Source: Ambulatory Visit | Attending: Family Medicine | Admitting: Family Medicine

## 2018-02-04 ENCOUNTER — Other Ambulatory Visit: Payer: Self-pay | Admitting: Family Medicine

## 2018-02-04 DIAGNOSIS — R05 Cough: Secondary | ICD-10-CM

## 2018-02-04 DIAGNOSIS — Z87891 Personal history of nicotine dependence: Secondary | ICD-10-CM

## 2018-02-04 DIAGNOSIS — R059 Cough, unspecified: Secondary | ICD-10-CM

## 2018-02-05 ENCOUNTER — Other Ambulatory Visit: Payer: Self-pay | Admitting: Family Medicine

## 2018-02-05 ENCOUNTER — Ambulatory Visit
Admission: RE | Admit: 2018-02-05 | Discharge: 2018-02-05 | Disposition: A | Discharge status: Home / Self Care | Payer: Medicare Other | Source: Ambulatory Visit | Attending: Family Medicine | Admitting: Family Medicine

## 2018-02-05 DIAGNOSIS — Z87891 Personal history of nicotine dependence: Secondary | ICD-10-CM

## 2018-02-05 DIAGNOSIS — Z136 Encounter for screening for cardiovascular disorders: Secondary | ICD-10-CM | POA: Diagnosis not present

## 2018-03-06 ENCOUNTER — Other Ambulatory Visit: Payer: Self-pay | Admitting: Endocrinology

## 2018-03-24 DIAGNOSIS — H2513 Age-related nuclear cataract, bilateral: Secondary | ICD-10-CM | POA: Diagnosis not present

## 2018-03-24 DIAGNOSIS — H02831 Dermatochalasis of right upper eyelid: Secondary | ICD-10-CM | POA: Diagnosis not present

## 2018-03-24 DIAGNOSIS — H02834 Dermatochalasis of left upper eyelid: Secondary | ICD-10-CM | POA: Diagnosis not present

## 2018-03-24 DIAGNOSIS — H524 Presbyopia: Secondary | ICD-10-CM | POA: Diagnosis not present

## 2018-03-24 DIAGNOSIS — H52223 Regular astigmatism, bilateral: Secondary | ICD-10-CM | POA: Diagnosis not present

## 2018-03-24 DIAGNOSIS — H5213 Myopia, bilateral: Secondary | ICD-10-CM | POA: Diagnosis not present

## 2018-03-24 DIAGNOSIS — E113293 Type 2 diabetes mellitus with mild nonproliferative diabetic retinopathy without macular edema, bilateral: Secondary | ICD-10-CM | POA: Diagnosis not present

## 2018-03-24 LAB — HM DIABETES EYE EXAM

## 2018-03-27 DIAGNOSIS — K648 Other hemorrhoids: Secondary | ICD-10-CM | POA: Diagnosis not present

## 2018-03-27 DIAGNOSIS — Z86010 Personal history of colonic polyps: Secondary | ICD-10-CM | POA: Diagnosis not present

## 2018-04-02 ENCOUNTER — Encounter: Payer: Self-pay | Admitting: Endocrinology

## 2018-04-02 ENCOUNTER — Ambulatory Visit (INDEPENDENT_AMBULATORY_CARE_PROVIDER_SITE_OTHER): Payer: Medicare Other | Admitting: Endocrinology

## 2018-04-02 VITALS — BP 134/68 | HR 54 | Wt 183.6 lb

## 2018-04-02 DIAGNOSIS — E1142 Type 2 diabetes mellitus with diabetic polyneuropathy: Secondary | ICD-10-CM | POA: Diagnosis not present

## 2018-04-02 LAB — POCT GLYCOSYLATED HEMOGLOBIN (HGB A1C): HEMOGLOBIN A1C: 7.5

## 2018-04-02 MED ORDER — METFORMIN HCL ER 500 MG PO TB24: 2500.0000 mg | ORAL_TABLET | Freq: Every day | ORAL | 3 refills | Status: DC

## 2018-04-02 NOTE — Patient Instructions (Addendum)

## 2018-04-02 NOTE — Progress Notes (Signed)
Subjective:    Patient ID: Nathan Duncan, male    DOB: 04-30-1951, 67 y.o.   MRN: [id number]  HPI Pt returns for f/u of diabetes mellitus: DM type: 2 (although he may be developing type 1).   Dx'ed: 9480. Complications: polyneuropathy.   Therapy: 3 oral meds.  DKA: never.  Severe hypoglycemia: once, in the 1980's--he does not know what med or meds he was taking then.  Pancreatitis: never.  Other: he has never been on insulin, be he has learned how to take; he also declines to add other oral meds; he does not check cbg's. Interval history: pt states he feels well in general.   Past Medical History:  Diagnosis Date  . Diabetes ([place])     History reviewed. No pertinent surgical history.  Social History   Socioeconomic History  . Marital status: Married    Spouse name: Not on file  . Number of children: Not on file  . Years of education: Not on file  . Highest education level: Not on file  Occupational History  . Not on file  Social Needs  . Financial resource strain: Not on file  . Food insecurity:    Worry: Not on file    Inability: Not on file  . Transportation needs:    Medical: Not on file    Non-medical: Not on file  Tobacco Use  . Smoking status: Former Research scientist (life sciences)  . Smokeless tobacco: Never Used  Substance and Sexual Activity  . Alcohol use: Yes  . Drug use: Not on file  . Sexual activity: Not on file  Lifestyle  . Physical activity:    Days per week: Not on file    Minutes per session: Not on file  . Stress: Not on file  Relationships  . Social connections:    Talks on phone: Not on file    Gets together: Not on file    Attends religious service: Not on file    Active member of club or organization: Not on file    Attends meetings of clubs or organizations: Not on file    Relationship status: Not on file  . Intimate partner violence:    Fear of current or ex partner: Not on file    Emotionally abused: Not on file    Physically abused: Not on file      Forced sexual activity: Not on file  Other Topics Concern  . Not on file  Social History Narrative  . Not on file    Current Outpatient Medications on File Prior to Visit  Medication Sig Dispense Refill  . ACCU-CHEK SMARTVIEW test strip Use to check blood sugar 4 times per day. 200 each 2  . aspirin EC 81 MG tablet Take 81 mg by mouth daily.    Marland Kitchen atorvastatin (LIPITOR) 40 MG tablet Take 40 mg by mouth daily.     . Blood Glucose Monitoring Suppl (ACCU-CHEK NANO SMARTVIEW) w/Device KIT 1 each by Does not apply route daily. 1 kit 0  . glyBURIDE (DIABETA) 5 MG tablet Take 2 tablets (10 mg total) by mouth 2 (two) times daily with a meal. 360 tablet 1  . INVOKANA 300 MG TABS tablet TAKE 1 TABLET BY MOUTH DAILY WITH BREAKFAST 30 tablet 4  . lisinopril (PRINIVIL,ZESTRIL) 5 MG tablet      No current facility-administered medications on file prior to visit.     No Known Allergies  Family History  Problem Relation Age of Onset  . Diabetes  Mother     BP 134/68 (BP Location: Left Arm, Patient Position: Sitting, Cuff Size: Normal)   Pulse (!) 54   Wt 183 lb 9.6 oz (83.3 kg)   SpO2 96%   BMI 25.61 kg/m    Review of Systems He has lost 3 lbs    Objective:   Physical Exam VITAL SIGNS:  See vs page GENERAL: no distress Pulses: foot pulses are intact bilaterally.   MSK: no deformity of the feet or ankles.  CV: no edema of the legs or ankles Skin:  no ulcer on the feet or ankles.  normal color and temp on the feet and ankles Neuro: sensation is intact to touch on the feet and ankles.     A1c=7.5%    Assessment & Plan:  Insulin-requiring type 2 DM: he needs increased rx, if it can be done with a regimen that avoids or minimizes hypoglycemia.  He declines to add another medication.  Patient Instructions  check your blood sugar once a day.  vary the time of day when you check, between before the 3 meals, and at bedtime.  also check if you have symptoms of your blood sugar being  too high or too low.  please keep a record of the readings and bring it to your next appointment here (or you can bring the meter itself).  You can write it on any piece of paper.  please call us sooner if your blood sugar goes below 70, or if you have a lot of readings over 200.   Please continue the same medications.   Please come back for a follow-up appointment in 3-4 months.

## 2018-04-07 ENCOUNTER — Telehealth: Payer: Self-pay | Admitting: *Deleted

## 2018-04-07 ENCOUNTER — Other Ambulatory Visit: Payer: Self-pay

## 2018-04-07 MED ORDER — EMPAGLIFLOZIN 25 MG PO TABS: 25.0000 mg | ORAL_TABLET | Freq: Every day | ORAL | 11 refills | Status: DC

## 2018-04-07 NOTE — Telephone Encounter (Signed)
I called and notified patient that new prescription was sent to pharmacy.

## 2018-04-07 NOTE — Telephone Encounter (Signed)
Invokana 300 mg is not covered.  Nathan Duncan, Synjardy/Synjardy XR, and Xigduo XR are preferred. Can we send in a new Rx?

## 2018-04-07 NOTE — Telephone Encounter (Signed)
I have sent a prescription to your pharmacy, to change to jardiance

## 2018-04-17 ENCOUNTER — Other Ambulatory Visit: Payer: Self-pay

## 2018-04-17 MED ORDER — EMPAGLIFLOZIN 25 MG PO TABS: 25.0000 mg | ORAL_TABLET | Freq: Every day | ORAL | 11 refills | Status: DC

## 2018-04-17 NOTE — Telephone Encounter (Signed)
I called patient & resent prescription for jardiance. Receipt was confirmed by pharmacy.

## 2018-04-17 NOTE — Telephone Encounter (Signed)
Pt wife called and no Rx have been sent to pharmacy in place of invokana. Please advise?  Pharmacy is [facility] [address], [place] - [address]  Call wife @ [phone]. Thank you!

## 2018-04-20 ENCOUNTER — Other Ambulatory Visit: Payer: Self-pay

## 2018-04-28 DIAGNOSIS — J329 Chronic sinusitis, unspecified: Secondary | ICD-10-CM | POA: Diagnosis not present

## 2018-06-26 IMAGING — US US ABDOMINAL AORTA SCREENING AAA
1 series · 14 of 17 positions shown · non-contrast
Comparison: None.

CLINICAL DATA: 66-year-old patient with history of tobacco use

EXAM:
ULTRASOUND OF ABDOMINAL AORTA
TECHNIQUE: Ultrasound examination of the abdominal aorta was performed to
evaluate for abdominal aortic aneurysm.

[Series 1: us abdominal aorta screening aaa · 0.36mm/px · 14 of 17 slices shown]
[im 1/17]
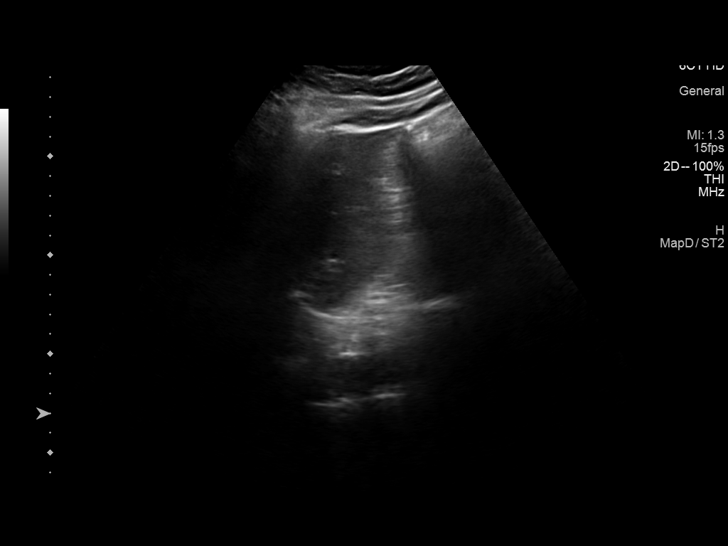
[im 2/17]
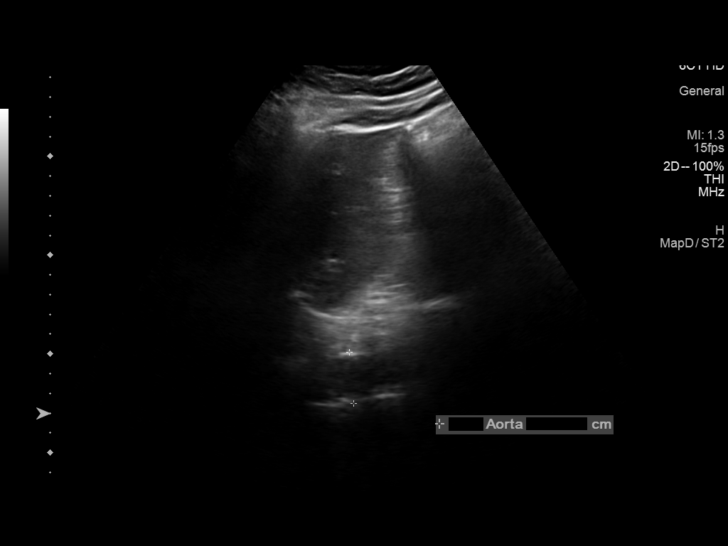
[im 4/17]
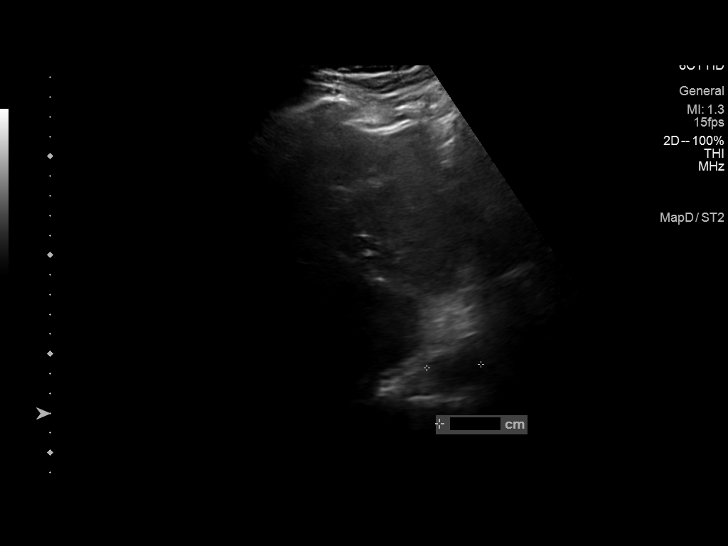
[im 5/17]
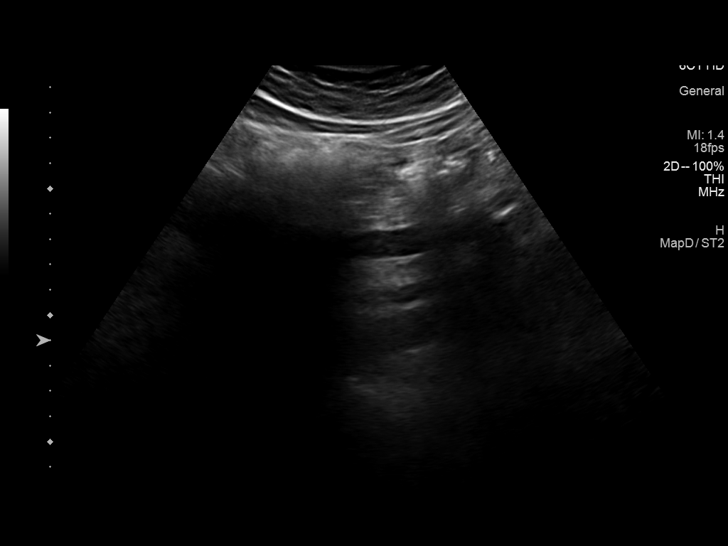
[im 6/17]
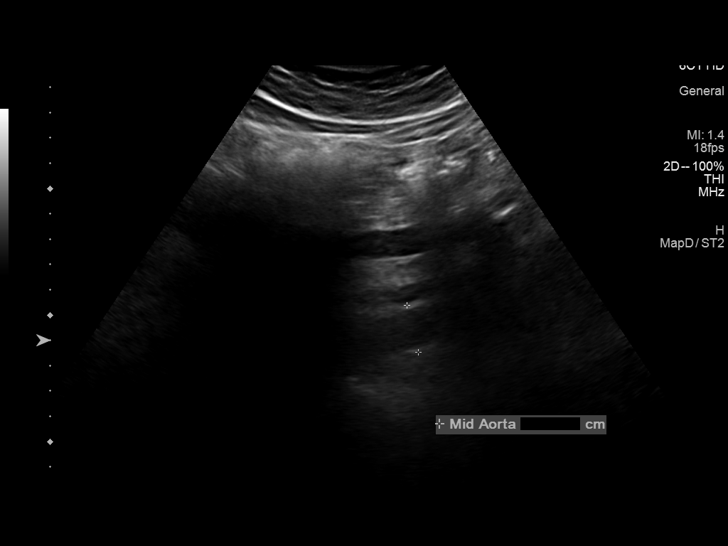
[im 7/17]
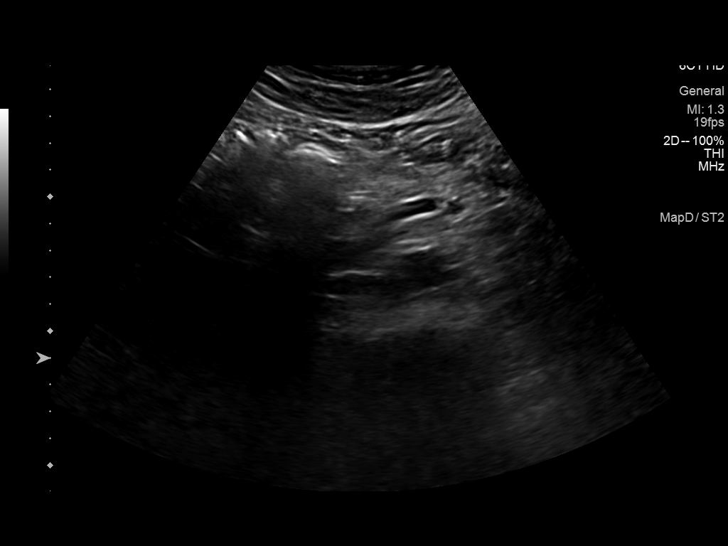
[im 8/17]
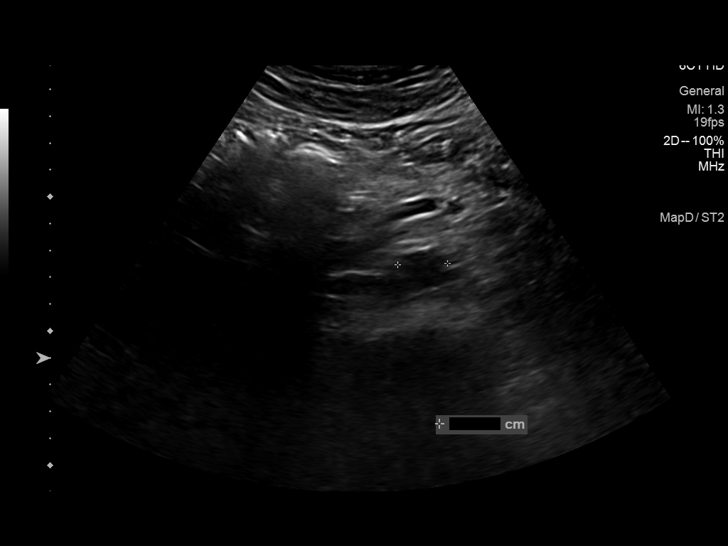
[im 10/17]
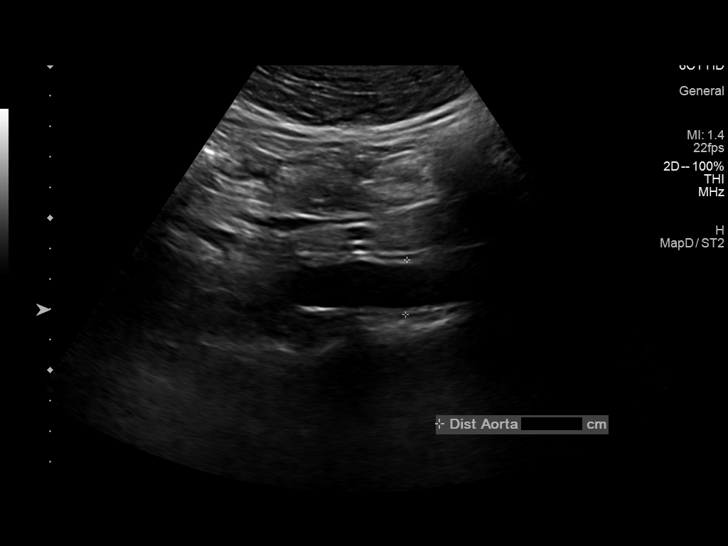
[im 11/17]
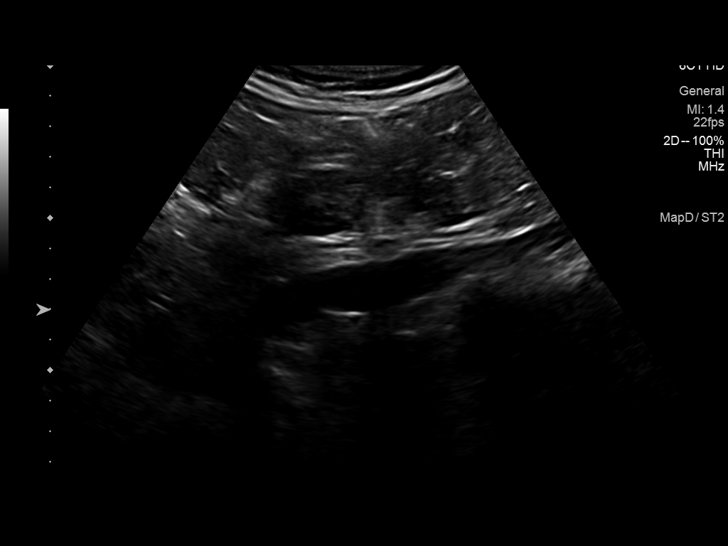
[im 12/17]
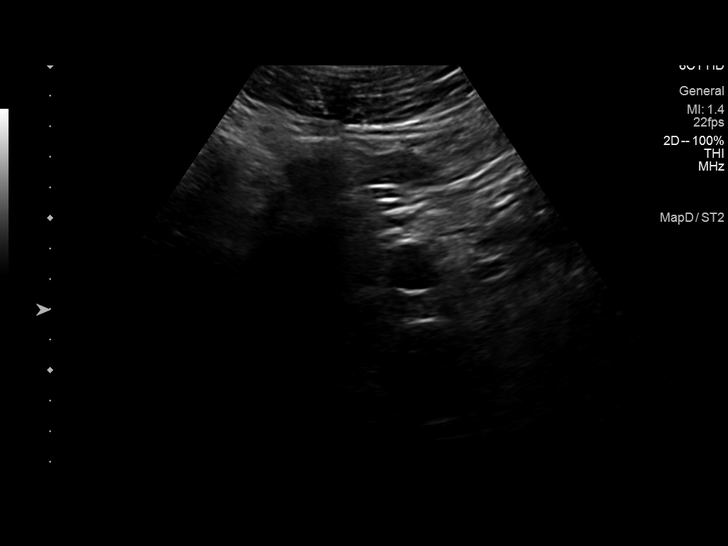
[im 13/17]
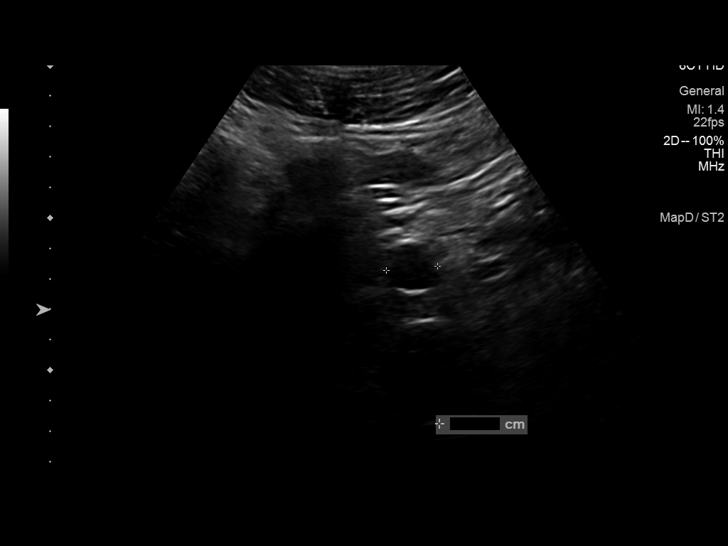
[im 14/17]
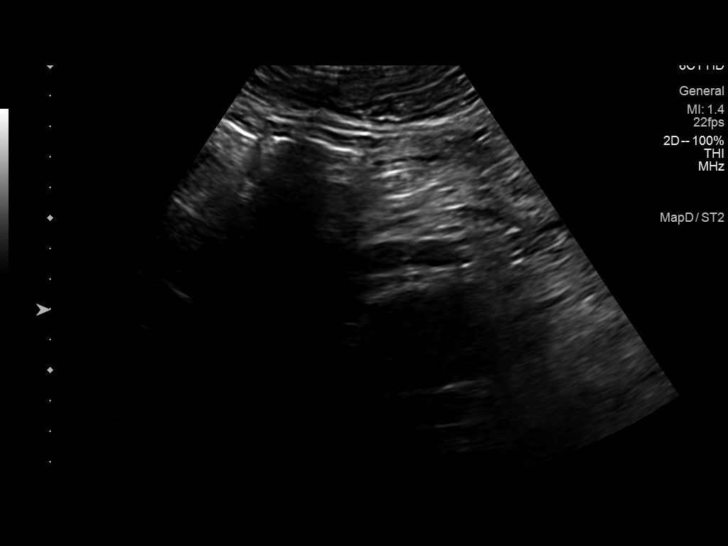
[im 16/17]
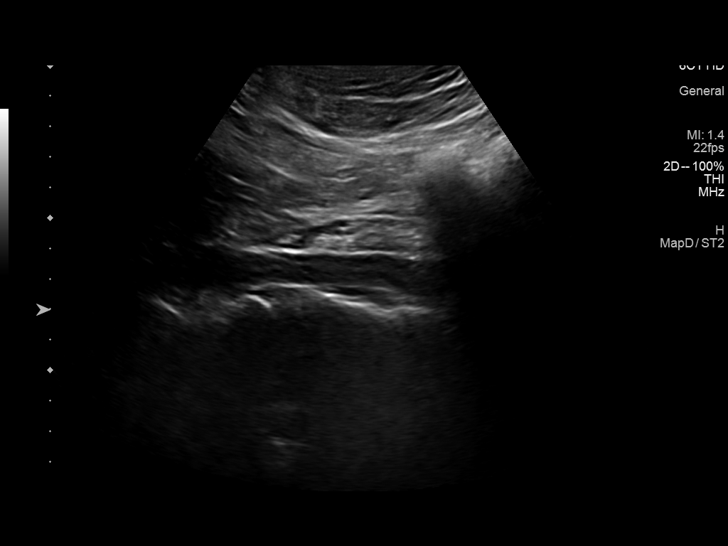
[im 17/17]
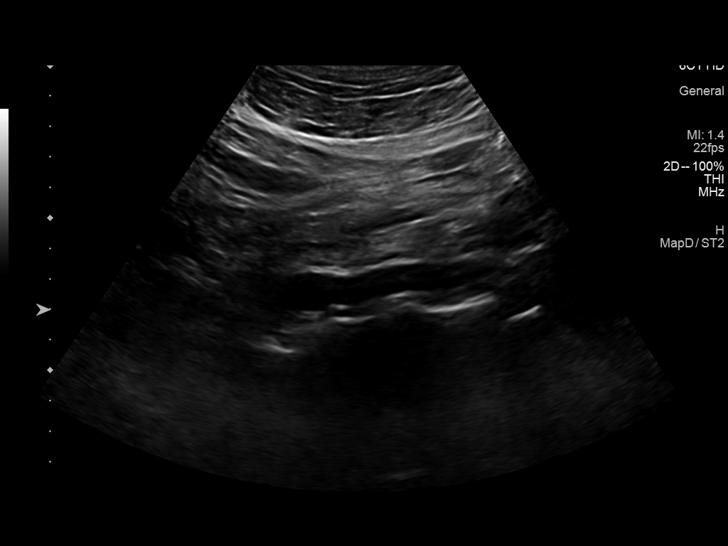

[14 of 17 positions shown; findings below may reference images not displayed]

FINDINGS: Abdominal aortic measurements as follows:

Proximal:  2.6 x 2.7 cm

Mid:  1.9 x 1.9 cm

Distal:  1.8 x 1.7 cm

No periaortic fluid or adenopathy. Foci of aortic atherosclerosis
noted. Bifurcation region appears unremarkable.
IMPRESSION: 1. Maximum aortic diameter measurement is 2.6 x 2.7 cm. Ectatic
abdominal aorta at risk for aneurysm development. Recommend followup
by ultrasound in 5 years. This recommendation follows ACR consensus
guidelines: White Paper of the ACR Incidental Findings Committee II
on Vascular Findings. [HOSPITAL] 6010; [DATE].

2.  Foci of aortic atherosclerosis noted.

Aortic Atherosclerosis (9GA0F-NCG.G).

## 2018-08-07 ENCOUNTER — Other Ambulatory Visit: Payer: Self-pay | Admitting: Endocrinology

## 2018-08-07 ENCOUNTER — Ambulatory Visit: Payer: Medicare Other | Admitting: Endocrinology

## 2018-09-01 ENCOUNTER — Other Ambulatory Visit: Payer: Self-pay

## 2018-09-01 MED ORDER — METFORMIN HCL ER 500 MG PO TB24: 2500.0000 mg | ORAL_TABLET | Freq: Every day | ORAL | 3 refills | Status: DC

## 2018-09-08 ENCOUNTER — Encounter: Payer: Self-pay | Admitting: Endocrinology

## 2018-09-08 ENCOUNTER — Ambulatory Visit (INDEPENDENT_AMBULATORY_CARE_PROVIDER_SITE_OTHER): Payer: Medicare Other | Admitting: Endocrinology

## 2018-09-08 VITALS — BP 122/62 | HR 58 | Ht 71.0 in | Wt 184.6 lb

## 2018-09-08 DIAGNOSIS — E1142 Type 2 diabetes mellitus with diabetic polyneuropathy: Secondary | ICD-10-CM

## 2018-09-08 LAB — POCT GLYCOSYLATED HEMOGLOBIN (HGB A1C): Hemoglobin A1C: 8.5 % — AB (ref 4.0–5.6)

## 2018-09-08 NOTE — Patient Instructions (Addendum)

## 2018-09-08 NOTE — Progress Notes (Signed)
Subjective:    Patient ID: Nathan Duncan, male    DOB: 04-16-1951, 67 y.o.   MRN: [id number]  HPI Pt returns for f/u of diabetes mellitus: DM type: 2 (although he may be developing type 1).   Dx'ed: 7939. Complications: polyneuropathy.   Therapy: 3 oral meds.  DKA: never.  Severe hypoglycemia: once, in the 1980's--he does not know what med or meds he was taking then.  Pancreatitis: never.  Other: he has never been on insulin, be he has learned how to take; he also declines to add other oral meds; he does not check cbg's. Interval history: pt states he feels well in general.  Past Medical History:  Diagnosis Date  . Diabetes ([place])     No past surgical history on file.  Social History   Socioeconomic History  . Marital status: Married    Spouse name: Not on file  . Number of children: Not on file  . Years of education: Not on file  . Highest education level: Not on file  Occupational History  . Not on file  Social Needs  . Financial resource strain: Not on file  . Food insecurity:    Worry: Not on file    Inability: Not on file  . Transportation needs:    Medical: Not on file    Non-medical: Not on file  Tobacco Use  . Smoking status: Former Research scientist (life sciences)  . Smokeless tobacco: Never Used  Substance and Sexual Activity  . Alcohol use: Yes  . Drug use: Not on file  . Sexual activity: Not on file  Lifestyle  . Physical activity:    Days per week: Not on file    Minutes per session: Not on file  . Stress: Not on file  Relationships  . Social connections:    Talks on phone: Not on file    Gets together: Not on file    Attends religious service: Not on file    Active member of club or organization: Not on file    Attends meetings of clubs or organizations: Not on file    Relationship status: Not on file  . Intimate partner violence:    Fear of current or ex partner: Not on file    Emotionally abused: Not on file    Physically abused: Not on file    Forced sexual  activity: Not on file  Other Topics Concern  . Not on file  Social History Narrative  . Not on file    Current Outpatient Medications on File Prior to Visit  Medication Sig Dispense Refill  . ACCU-CHEK SMARTVIEW test strip Use to check blood sugar 4 times per day. 200 each 2  . aspirin EC 81 MG tablet Take 81 mg by mouth daily.    Marland Kitchen atorvastatin (LIPITOR) 40 MG tablet Take 40 mg by mouth daily.     . Blood Glucose Monitoring Suppl (ACCU-CHEK NANO SMARTVIEW) w/Device KIT 1 each by Does not apply route daily. 1 kit 0  . empagliflozin (JARDIANCE) 25 MG TABS tablet Take 25 mg by mouth daily. 30 tablet 11  . glyBURIDE (DIABETA) 5 MG tablet Take 2 tablets (10 mg total) by mouth 2 (two) times daily with a meal. 360 tablet 1  . lisinopril (PRINIVIL,ZESTRIL) 5 MG tablet     . metFORMIN (GLUCOPHAGE-XR) 500 MG 24 hr tablet Take 5 tablets (2,500 mg total) by mouth daily. 450 tablet 3   No current facility-administered medications on file prior to visit.  No Known Allergies  Family History  Problem Relation Age of Onset  . Diabetes Mother     BP 122/62   Pulse (!) 58   Ht _0  (1.803 m)   Wt 184 lb 9.6 oz (83.7 kg)   SpO2 97%   BMI 25.75 kg/m    Review of Systems He denies hypoglycemia    Objective:   Physical Exam VITAL SIGNS:  See vs page GENERAL: no distress Pulses: dorsalis pedis intact bilat.   MSK: no deformity of the feet CV: no leg edema Skin:  no ulcer on the feet.  normal color and temp on the feet. Neuro: sensation is intact to touch on the feet.   A1c=8.5%  Lab Results  Component Value Date   CREATININE 1.13 03/07/2017   BUN 26 (H) 03/07/2017   NA 132 (L) 03/07/2017   K 4.2 03/07/2017   CL 101 03/07/2017   CO2 24 03/07/2017       Assessment & Plan:  Type 2 DM, with polyneuropathy: worse.  I advised him at add another medication.Marland Kitchen  He declines.  Noncompliance with cbg recording: we discussed.    Patient Instructions  check your blood sugar once a  day.  vary the time of day when you check, between before the 3 meals, and at bedtime.  also check if you have symptoms of your blood sugar being too high or too low.  please keep a record of the readings and bring it to your next appointment here (or you can bring the meter itself).  You can write it on any piece of paper.  please call us sooner if your blood sugar goes below 70, or if you have a lot of readings over 200.   Please continue the same medications.   Please come back for a follow-up appointment in 2-3 months.

## 2018-10-04 ENCOUNTER — Other Ambulatory Visit: Payer: Self-pay | Admitting: Endocrinology

## 2018-10-12 DIAGNOSIS — Z23 Encounter for immunization: Secondary | ICD-10-CM | POA: Diagnosis not present

## 2018-10-25 ENCOUNTER — Other Ambulatory Visit: Payer: Self-pay | Admitting: Endocrinology

## 2018-12-08 ENCOUNTER — Ambulatory Visit: Payer: Medicare Other | Admitting: Endocrinology

## 2018-12-28 ENCOUNTER — Encounter: Payer: Self-pay | Admitting: Endocrinology

## 2018-12-28 NOTE — Telephone Encounter (Signed)
Please advise 

## 2019-01-14 ENCOUNTER — Ambulatory Visit: Payer: Medicare Other | Admitting: Endocrinology

## 2019-02-02 DIAGNOSIS — H5213 Myopia, bilateral: Secondary | ICD-10-CM | POA: Diagnosis not present

## 2019-02-02 DIAGNOSIS — H524 Presbyopia: Secondary | ICD-10-CM | POA: Diagnosis not present

## 2019-02-02 DIAGNOSIS — H52223 Regular astigmatism, bilateral: Secondary | ICD-10-CM | POA: Diagnosis not present

## 2019-02-02 DIAGNOSIS — H35372 Puckering of macula, left eye: Secondary | ICD-10-CM | POA: Diagnosis not present

## 2019-02-02 DIAGNOSIS — H02831 Dermatochalasis of right upper eyelid: Secondary | ICD-10-CM | POA: Diagnosis not present

## 2019-02-02 DIAGNOSIS — H02832 Dermatochalasis of right lower eyelid: Secondary | ICD-10-CM | POA: Diagnosis not present

## 2019-02-02 DIAGNOSIS — H2513 Age-related nuclear cataract, bilateral: Secondary | ICD-10-CM | POA: Diagnosis not present

## 2019-02-16 ENCOUNTER — Encounter: Payer: Self-pay | Admitting: Endocrinology

## 2019-02-16 ENCOUNTER — Ambulatory Visit (INDEPENDENT_AMBULATORY_CARE_PROVIDER_SITE_OTHER): Payer: Medicare Other | Admitting: Endocrinology

## 2019-02-16 ENCOUNTER — Other Ambulatory Visit: Payer: Self-pay

## 2019-02-16 VITALS — BP 130/68 | HR 78 | Ht 71.0 in | Wt 188.8 lb

## 2019-02-16 DIAGNOSIS — E1142 Type 2 diabetes mellitus with diabetic polyneuropathy: Secondary | ICD-10-CM | POA: Diagnosis not present

## 2019-02-16 LAB — POCT GLYCOSYLATED HEMOGLOBIN (HGB A1C): HEMOGLOBIN A1C: 8.9 % — AB (ref 4.0–5.6)

## 2019-02-16 NOTE — Progress Notes (Signed)
Subjective:    Patient ID: Nathan Duncan, male    DOB: [birth year], 68 y.o.   MRN: [id number]  HPI Pt returns for f/u of diabetes mellitus: DM type: 2 (although he may be developing type 1).   Dx'ed: 0814. Complications: polyneuropathy.   Therapy: 3 oral meds.  DKA: never.  Severe hypoglycemia: once, in the 1980's--he does not know what med or meds he was taking then.  Pancreatitis: never.  Other: he has never been on insulin, be he has learned how to take; he also declines to add other oral meds; he does not check cbg's. Interval history: pt states he feels well in general.  He takes meds as rx'ed.   Past Medical History:  Diagnosis Date  . Diabetes ([place])     No past surgical history on file.  Social History   Socioeconomic History  . Marital status: Married    Spouse name: Not on file  . Number of children: Not on file  . Years of education: Not on file  . Highest education level: Not on file  Occupational History  . Not on file  Social Needs  . Financial resource strain: Not on file  . Food insecurity:    Worry: Not on file    Inability: Not on file  . Transportation needs:    Medical: Not on file    Non-medical: Not on file  Tobacco Use  . Smoking status: Former Research scientist (life sciences)  . Smokeless tobacco: Never Used  Substance and Sexual Activity  . Alcohol use: Yes  . Drug use: Not on file  . Sexual activity: Not on file  Lifestyle  . Physical activity:    Days per week: Not on file    Minutes per session: Not on file  . Stress: Not on file  Relationships  . Social connections:    Talks on phone: Not on file    Gets together: Not on file    Attends religious service: Not on file    Active member of club or organization: Not on file    Attends meetings of clubs or organizations: Not on file    Relationship status: Not on file  . Intimate partner violence:    Fear of current or ex partner: Not on file    Emotionally abused: Not on file    Physically abused: Not  on file    Forced sexual activity: Not on file  Other Topics Concern  . Not on file  Social History Narrative  . Not on file    Current Outpatient Medications on File Prior to Visit  Medication Sig Dispense Refill  . ACCU-CHEK SMARTVIEW test strip USE TO CHECK BLOOD SUGAR 4 TIMES PER DAY 200 each 2  . aspirin EC 81 MG tablet Take 81 mg by mouth daily.    Marland Kitchen atorvastatin (LIPITOR) 40 MG tablet Take 40 mg by mouth daily.     . Blood Glucose Monitoring Suppl (ACCU-CHEK NANO SMARTVIEW) w/Device KIT 1 each by Does not apply route daily. 1 kit 0  . empagliflozin (JARDIANCE) 25 MG TABS tablet Take 25 mg by mouth daily. 30 tablet 11  . glyBURIDE (DIABETA) 5 MG tablet TAKE 2 TABLETS BY MOUTH TWICE DAILY WITH A MEAL 120 tablet 11  . lisinopril (PRINIVIL,ZESTRIL) 5 MG tablet     . metFORMIN (GLUCOPHAGE-XR) 500 MG 24 hr tablet Take 5 tablets (2,500 mg total) by mouth daily. 450 tablet 3   No current facility-administered medications on file prior to  visit.     No Known Allergies  Family History  Problem Relation Age of Onset  . Diabetes Mother     BP 130/68 (BP Location: Right Arm, Patient Position: Sitting, Cuff Size: Normal)   Pulse 78   Ht _0  (1.803 m)   Wt 188 lb 12.8 oz (85.6 kg)   SpO2 95%   BMI 26.33 kg/m    Review of Systems He denies hypoglycemia    Objective:   Physical Exam VITAL SIGNS:  See vs page GENERAL: no distress Pulses: dorsalis pedis intact bilat.   MSK: no deformity of the feet CV: no leg edema Skin:  no ulcer on the feet.  normal color and temp on the feet. Neuro: sensation is intact to touch on the feet.  Ext: There is bilateral onychomycosis of the toenails.    A1c=8.9%  Lab Results  Component Value Date   CREATININE 1.13 03/07/2017   BUN 26 (H) 03/07/2017   NA 132 (L) 03/07/2017   K 4.2 03/07/2017   CL 101 03/07/2017   CO2 24 03/07/2017       Assessment & Plan:  Type 2 DM, with PN: worse. I advised him to take insulin, or even to add  another oral med.  He declines.  Patient Instructions  check your blood sugar once a day.  vary the time of day when you check, between before the 3 meals, and at bedtime.  also check if you have symptoms of your blood sugar being too high or too low.  please keep a record of the readings and bring it to your next appointment here (or you can bring the meter itself).  You can write it on any piece of paper.  please call us sooner if your blood sugar goes below 70, or if you have a lot of readings over 200.   Please continue the same medications.   Please come back for a follow-up appointment in 2-3 months.

## 2019-02-16 NOTE — Patient Instructions (Signed)

## 2019-03-15 ENCOUNTER — Telehealth: Payer: Self-pay | Admitting: Endocrinology

## 2019-03-15 NOTE — Telephone Encounter (Signed)
MEDICATION: Atorvastatin 40 MG  PHARMACY:  [facility] on [address]  IS THIS A 90 DAY SUPPLY : no   IS PATIENT OUT OF MEDICATION: YES  IF NOT; HOW MUCH IS LEFT:   LAST APPOINTMENT DATE: @2 /25/2020  NEXT APPOINTMENT DATE:@6 /01/2019  DO WE HAVE YOUR PERMISSION TO LEAVE A DETAILED MESSAGE: YES  OTHER COMMENTS:    **Let patient know to contact pharmacy at the end of the day to make sure medication is ready. **  ** Please notify patient to allow 48-72 hours to process**  **Encourage patient to contact the pharmacy for refills or they can request refills through [facility]**

## 2019-03-16 NOTE — Telephone Encounter (Signed)
MEDICATION: Atorvastatin 40 MG  PHARMACY:  [facility] on [address]  IS THIS A 90 DAY SUPPLY : no   IS PATIENT OUT OF MEDICATION: YES  IF NOT; HOW MUCH IS LEFT:   LAST APPOINTMENT DATE: @2 /25/2020  NEXT APPOINTMENT DATE:@6 /01/2019  DO WE HAVE YOUR PERMISSION TO LEAVE A DETAILED MESSAGE: YES  OTHER COMMENTS:  Patient states this RX is on hold currently at the pharmacy.   **Let patient know to contact pharmacy at the end of the day to make sure medication is ready. **  ** Please notify patient to allow 48-72 hours to process**  **Encourage patient to contact the pharmacy for refills or they can request refills through [facility]**

## 2019-03-16 NOTE — Telephone Encounter (Signed)
refills would have to be considered by pt's PCP

## 2019-03-16 NOTE — Telephone Encounter (Signed)
Called pt Rx atorvastatin need to be refill by PCP. Pt understanding and agreement.

## 2019-03-16 NOTE — Telephone Encounter (Signed)
06/19/17 last refill Last seen 02/16/19  Please advise

## 2019-04-12 ENCOUNTER — Other Ambulatory Visit: Payer: Self-pay | Admitting: Endocrinology

## 2019-05-10 ENCOUNTER — Other Ambulatory Visit: Payer: Self-pay | Admitting: Endocrinology

## 2019-05-20 DIAGNOSIS — I10 Essential (primary) hypertension: Secondary | ICD-10-CM | POA: Diagnosis not present

## 2019-05-20 DIAGNOSIS — E119 Type 2 diabetes mellitus without complications: Secondary | ICD-10-CM | POA: Diagnosis not present

## 2019-05-20 DIAGNOSIS — Z125 Encounter for screening for malignant neoplasm of prostate: Secondary | ICD-10-CM | POA: Diagnosis not present

## 2019-05-20 DIAGNOSIS — E782 Mixed hyperlipidemia: Secondary | ICD-10-CM | POA: Diagnosis not present

## 2019-05-25 ENCOUNTER — Ambulatory Visit (INDEPENDENT_AMBULATORY_CARE_PROVIDER_SITE_OTHER): Payer: Medicare Other | Admitting: Endocrinology

## 2019-05-25 ENCOUNTER — Other Ambulatory Visit: Payer: Self-pay

## 2019-05-25 DIAGNOSIS — E1142 Type 2 diabetes mellitus with diabetic polyneuropathy: Secondary | ICD-10-CM | POA: Diagnosis not present

## 2019-05-25 NOTE — Progress Notes (Signed)
Subjective:    Patient ID: Nathan Duncan, male    DOB: Aug 18, 1951, 68 y.o.   MRN: [id number]  HPI  telehealth visit today via doxy video visit.  Alternatives to telehealth are presented to this patient, and the patient agrees to the telehealth visit.   Pt is advised of the cost of the visit, and agrees to this, also.   Patient is at home, and I am at the office.   Persons attending the telehealth visit: the patient and I.   Pt returns for f/u of diabetes mellitus: DM type: 2 (although he may be developing type 1).   Dx'ed: 8338. Complications: PN and renal insuff  Therapy: 3 oral meds.  DKA: never.  Severe hypoglycemia: once, in the 1980's--he does not know what med or meds he was taking then.  Pancreatitis: never.  Other: he has never been on insulin, be he has learned how to take; he also declines to add other oral meds; he does not check cbg's.   Interval history: pt states he feels well in general.  He takes meds as rx'ed.  He says cbg varies from 91-144.   Past Medical History:  Diagnosis Date  . Diabetes ([place])     No past surgical history on file.  Social History   Socioeconomic History  . Marital status: Married    Spouse name: Not on file  . Number of children: Not on file  . Years of education: Not on file  . Highest education level: Not on file  Occupational History  . Not on file  Social Needs  . Financial resource strain: Not on file  . Food insecurity:    Worry: Not on file    Inability: Not on file  . Transportation needs:    Medical: Not on file    Non-medical: Not on file  Tobacco Use  . Smoking status: Former Research scientist (life sciences)  . Smokeless tobacco: Never Used  Substance and Sexual Activity  . Alcohol use: Yes  . Drug use: Not on file  . Sexual activity: Not on file  Lifestyle  . Physical activity:    Days per week: Not on file    Minutes per session: Not on file  . Stress: Not on file  Relationships  . Social connections:    Talks on phone: Not  on file    Gets together: Not on file    Attends religious service: Not on file    Active member of club or organization: Not on file    Attends meetings of clubs or organizations: Not on file    Relationship status: Not on file  . Intimate partner violence:    Fear of current or ex partner: Not on file    Emotionally abused: Not on file    Physically abused: Not on file    Forced sexual activity: Not on file  Other Topics Concern  . Not on file  Social History Narrative  . Not on file    Current Outpatient Medications on File Prior to Visit  Medication Sig Dispense Refill  . ACCU-CHEK SMARTVIEW test strip USE TO CHECK BLOOD SUGAR 4 TIMES PER DAY 200 each 2  . aspirin EC 81 MG tablet Take 81 mg by mouth daily.    Marland Kitchen atorvastatin (LIPITOR) 40 MG tablet Take 40 mg by mouth daily.     . Blood Glucose Monitoring Suppl (ACCU-CHEK NANO SMARTVIEW) w/Device KIT 1 each by Does not apply route daily. 1 kit 0  .  glyBURIDE (DIABETA) 5 MG tablet TAKE 2 TABLETS BY MOUTH TWICE DAILY WITH A MEAL 120 tablet 11  . JARDIANCE 25 MG TABS tablet Take 1 tablet by mouth once daily 30 tablet 0  . lisinopril (PRINIVIL,ZESTRIL) 5 MG tablet     . metFORMIN (GLUCOPHAGE-XR) 500 MG 24 hr tablet Take 5 tablets (2,500 mg total) by mouth daily. 450 tablet 3   No current facility-administered medications on file prior to visit.     No Known Allergies  Family History  Problem Relation Age of Onset  . Diabetes Mother     There were no vitals taken for this visit.   Review of Systems He denies hypoglycemia.     Objective:   Physical Exam   Lab Results  Component Value Date   CREATININE 1.13 03/07/2017   BUN 26 (H) 03/07/2017   NA 132 (L) 03/07/2017   K 4.2 03/07/2017   CL 101 03/07/2017   CO2 24 03/07/2017       Assessment & Plan:  Type 2 DM, with PN: glycemic control is apparently improved.  he declines labs now.   Renal insuff: This limits rx options and dosages.    Please continue the same  medications.   Please come back for a follow-up appointment in 1-2 months.

## 2019-06-07 ENCOUNTER — Other Ambulatory Visit: Payer: Self-pay | Admitting: Endocrinology

## 2019-06-10 ENCOUNTER — Other Ambulatory Visit: Payer: Self-pay | Admitting: Endocrinology

## 2019-06-10 ENCOUNTER — Other Ambulatory Visit: Payer: Self-pay

## 2019-06-10 DIAGNOSIS — E119 Type 2 diabetes mellitus without complications: Secondary | ICD-10-CM | POA: Diagnosis not present

## 2019-06-10 DIAGNOSIS — E782 Mixed hyperlipidemia: Secondary | ICD-10-CM | POA: Diagnosis not present

## 2019-06-10 DIAGNOSIS — Z125 Encounter for screening for malignant neoplasm of prostate: Secondary | ICD-10-CM | POA: Diagnosis not present

## 2019-06-10 DIAGNOSIS — I10 Essential (primary) hypertension: Secondary | ICD-10-CM | POA: Diagnosis not present

## 2019-06-10 MED ORDER — JARDIANCE 25 MG PO TABS: 25.0000 mg | ORAL_TABLET | Freq: Every day | ORAL | 2 refills | Status: DC

## 2019-06-16 ENCOUNTER — Other Ambulatory Visit: Payer: Self-pay | Admitting: Endocrinology

## 2019-06-16 ENCOUNTER — Other Ambulatory Visit: Payer: Self-pay

## 2019-06-16 MED ORDER — METFORMIN HCL 500 MG PO TABS: 2500.0000 mg | ORAL_TABLET | Freq: Every day | ORAL | 2 refills | Status: DC

## 2019-06-16 NOTE — Telephone Encounter (Signed)
Please advise if you are okay with me writing for Metformin same dose rather than XR

## 2019-06-16 NOTE — Telephone Encounter (Signed)
OK. Thank you

## 2019-06-24 ENCOUNTER — Other Ambulatory Visit: Payer: Self-pay

## 2019-06-29 ENCOUNTER — Ambulatory Visit (INDEPENDENT_AMBULATORY_CARE_PROVIDER_SITE_OTHER): Payer: Medicare Other | Admitting: Endocrinology

## 2019-06-29 ENCOUNTER — Other Ambulatory Visit: Payer: Self-pay

## 2019-06-29 ENCOUNTER — Encounter: Payer: Self-pay | Admitting: Endocrinology

## 2019-06-29 VITALS — BP 130/70 | HR 59 | Ht 71.0 in | Wt 184.0 lb

## 2019-06-29 DIAGNOSIS — E1142 Type 2 diabetes mellitus with diabetic polyneuropathy: Secondary | ICD-10-CM | POA: Diagnosis not present

## 2019-06-29 LAB — POCT GLYCOSYLATED HEMOGLOBIN (HGB A1C): Hemoglobin A1C: 7.8 % — AB (ref 4.0–5.6)

## 2019-06-29 NOTE — Progress Notes (Signed)
Subjective:    Patient ID: Nathan Duncan, male    DOB: May 21, 1951, 68 y.o.   MRN: [id number]  HPI Pt returns for f/u of diabetes mellitus: DM type: 2 (although he may be developing type 1).   Dx'ed: 6945. Complications: PN and renal insuff  Therapy: 3 oral meds.  DKA: never.  Severe hypoglycemia: once, in the 1980's--he does not know what med or meds he was taking then.  Pancreatitis: never.  Other: he has never been on insulin, be he has learned how to take; he also declines to add other oral meds; he does not check cbg's; he is retired.    Interval history: pt states he feels well in general.  He takes meds as rx'ed.  He brings a record of his fasting cbg's which I have reviewed today.  cbg varies from 87-191.   Past Medical History:  Diagnosis Date  . Diabetes ([place])     No past surgical history on file.  Social History   Socioeconomic History  . Marital status: Married    Spouse name: Not on file  . Number of children: Not on file  . Years of education: Not on file  . Highest education level: Not on file  Occupational History  . Not on file  Social Needs  . Financial resource strain: Not on file  . Food insecurity    Worry: Not on file    Inability: Not on file  . Transportation needs    Medical: Not on file    Non-medical: Not on file  Tobacco Use  . Smoking status: Former Research scientist (life sciences)  . Smokeless tobacco: Never Used  Substance and Sexual Activity  . Alcohol use: Yes  . Drug use: Not on file  . Sexual activity: Not on file  Lifestyle  . Physical activity    Days per week: Not on file    Minutes per session: Not on file  . Stress: Not on file  Relationships  . Social Herbalist on phone: Not on file    Gets together: Not on file    Attends religious service: Not on file    Active member of club or organization: Not on file    Attends meetings of clubs or organizations: Not on file    Relationship status: Not on file  . Intimate partner  violence    Fear of current or ex partner: Not on file    Emotionally abused: Not on file    Physically abused: Not on file    Forced sexual activity: Not on file  Other Topics Concern  . Not on file  Social History Narrative  . Not on file    Current Outpatient Medications on File Prior to Visit  Medication Sig Dispense Refill  . ACCU-CHEK SMARTVIEW test strip USE TO CHECK BLOOD SUGAR 4 TIMES PER DAY 200 each 2  . aspirin EC 81 MG tablet Take 81 mg by mouth daily.    Marland Kitchen atorvastatin (LIPITOR) 40 MG tablet Take 40 mg by mouth daily.     . Blood Glucose Monitoring Suppl (ACCU-CHEK NANO SMARTVIEW) w/Device KIT 1 each by Does not apply route daily. 1 kit 0  . empagliflozin (JARDIANCE) 25 MG TABS tablet Take 25 mg by mouth daily. 30 tablet 2  . glyBURIDE (DIABETA) 5 MG tablet TAKE 2 TABLETS BY MOUTH TWICE DAILY WITH A MEAL 120 tablet 11  . lisinopril (PRINIVIL,ZESTRIL) 5 MG tablet     . metFORMIN (GLUCOPHAGE)  500 MG tablet Take 5 tablets (2,500 mg total) by mouth daily. 150 tablet 2   No current facility-administered medications on file prior to visit.     No Known Allergies  Family History  Problem Relation Age of Onset  . Diabetes Mother     BP 130/70 (BP Location: Left Arm, Patient Position: Sitting, Cuff Size: Normal)   Pulse (!) 59   Ht 5' 11"  (1.803 m)   Wt 184 lb (83.5 kg)   SpO2 91%   BMI 25.66 kg/m    Review of Systems He denies hypoglycemia    Objective:   Physical Exam VITAL SIGNS:  See vs page GENERAL: no distress Pulses: dorsalis pedis intact bilat.   MSK: no deformity of the feet CV: no leg edema Skin:  no ulcer on the feet.  normal color and temp on the feet. Neuro: sensation is intact to touch on the feet  Lab Results  Component Value Date   CREATININE 1.13 03/07/2017   BUN 26 (H) 03/07/2017   NA 132 (L) 03/07/2017   K 4.2 03/07/2017   CL 101 03/07/2017   CO2 24 03/07/2017      Lab Results  Component Value Date   HGBA1C 7.8 (A) 06/29/2019        Assessment & Plan:  Type 2 DM, with PN: this is the best control this pt should aim for, given this SU-containing regimen.  Renal insuff: this is a relative contraindication to SU rx. I advised pt to transition glyburide to other rx, such as DPP.  Pt says he'll consider.  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    Patient Instructions  check your blood sugar once a day.  vary the time of day when you check, between before the 3 meals, and at bedtime.  also check if you have symptoms of your blood sugar being too high or too low.  please keep a record of the readings and bring it to your next appointment here (or you can bring the meter itself).  You can write it on any piece of paper.  please call us sooner if your blood sugar goes below 70, or if you have a lot of readings over 200.   Please continue the same medications.   Please come back for a follow-up appointment in 3 months.

## 2019-06-29 NOTE — Patient Instructions (Addendum)
check your blood sugar once a day.  vary the time of day when you check, between before the 3 meals, and at bedtime.  also check if you have symptoms of your blood sugar being too high or too low.  please keep a record of the readings and bring it to your next appointment here (or you can bring the meter itself).  You can write it on any piece of paper.  please call us sooner if your blood sugar goes below 70, or if you have a lot of readings over 200.   Please continue the same medications.   Please come back for a follow-up appointment in 3 months.

## 2019-08-26 DIAGNOSIS — Z23 Encounter for immunization: Secondary | ICD-10-CM | POA: Diagnosis not present

## 2019-09-06 ENCOUNTER — Other Ambulatory Visit: Payer: Self-pay | Admitting: Endocrinology

## 2019-09-30 ENCOUNTER — Ambulatory Visit: Payer: Medicare Other | Admitting: Endocrinology

## 2019-10-05 ENCOUNTER — Other Ambulatory Visit: Payer: Self-pay | Admitting: Endocrinology

## 2019-10-26 ENCOUNTER — Other Ambulatory Visit: Payer: Self-pay

## 2019-10-28 ENCOUNTER — Encounter: Payer: Self-pay | Admitting: Endocrinology

## 2019-10-28 ENCOUNTER — Other Ambulatory Visit: Payer: Self-pay

## 2019-10-28 ENCOUNTER — Ambulatory Visit (INDEPENDENT_AMBULATORY_CARE_PROVIDER_SITE_OTHER): Payer: Medicare Other | Admitting: Endocrinology

## 2019-10-28 VITALS — BP 110/50 | HR 86 | Ht 71.0 in | Wt 186.4 lb

## 2019-10-28 DIAGNOSIS — E1142 Type 2 diabetes mellitus with diabetic polyneuropathy: Secondary | ICD-10-CM

## 2019-10-28 DIAGNOSIS — E785 Hyperlipidemia, unspecified: Secondary | ICD-10-CM | POA: Diagnosis not present

## 2019-10-28 LAB — BASIC METABOLIC PANEL
BUN: 23 mg/dL (ref 6–23)
CO2: 26 mEq/L (ref 19–32)
Calcium: 9.4 mg/dL (ref 8.4–10.5)
Chloride: 102 mEq/L (ref 96–112)
Creatinine, Ser: 1.22 mg/dL (ref 0.40–1.50)
GFR: 58.59 mL/min — ABNORMAL LOW (ref >60.00)
Glucose, Bld: 169 mg/dL — ABNORMAL HIGH (ref 70–99)
Potassium: 4.5 mEq/L (ref 3.5–5.1)
Sodium: 137 mEq/L (ref 135–145)

## 2019-10-28 LAB — POCT GLYCOSYLATED HEMOGLOBIN (HGB A1C): Hemoglobin A1C: 8.5 % — AB (ref 4.0–5.6)

## 2019-10-28 LAB — TSH: TSH: 1.79 u[IU]/mL (ref 0.35–4.50)

## 2019-10-28 MED ORDER — SITAGLIPTIN PHOSPHATE 100 MG PO TABS: 100.0000 mg | ORAL_TABLET | Freq: Every day | ORAL | 3 refills | Status: DC

## 2019-10-28 MED ORDER — GLYBURIDE 5 MG PO TABS: 5.0000 mg | ORAL_TABLET | Freq: Every day | ORAL | 3 refills | Status: DC

## 2019-10-28 MED ORDER — JARDIANCE 25 MG PO TABS: 25.0000 mg | ORAL_TABLET | Freq: Every day | ORAL | 3 refills | Status: DC

## 2019-10-28 NOTE — Progress Notes (Signed)
Subjective:    Patient ID: Nathan Duncan, male    DOB: [birth year], 68 y.o.   MRN: [id number]  HPI Pt returns for f/u of diabetes mellitus: DM type: 2 (although he may be developing type 1).   Dx'ed: 7654. Complications: PN and renal insuff  Therapy: 3 oral meds.  DKA: never.  Severe hypoglycemia: once, in the 1980's--he does not know what med or meds he was taking then.  Pancreatitis: never.  Other: he has never been on insulin, be he has learned how to take; he also declines to add other oral meds; he declines to change glyburide to shorter-acting med; he does not check cbg's; he is retired.    Interval history: pt states he feels well in general.  He takes meds as rx'ed.  He brings a record of his fasting cbg's which I have reviewed today.  All are in the 100's.   Past Medical History:  Diagnosis Date  . Diabetes ([place])   . Dyslipidemia     No past surgical history on file.  Social History   Socioeconomic History  . Marital status: Married    Spouse name: Not on file  . Number of children: Not on file  . Years of education: Not on file  . Highest education level: Not on file  Occupational History  . Not on file  Social Needs  . Financial resource strain: Not on file  . Food insecurity    Worry: Not on file    Inability: Not on file  . Transportation needs    Medical: Not on file    Non-medical: Not on file  Tobacco Use  . Smoking status: Former Research scientist (life sciences)  . Smokeless tobacco: Never Used  Substance and Sexual Activity  . Alcohol use: Yes  . Drug use: Not on file  . Sexual activity: Not on file  Lifestyle  . Physical activity    Days per week: Not on file    Minutes per session: Not on file  . Stress: Not on file  Relationships  . Social Herbalist on phone: Not on file    Gets together: Not on file    Attends religious service: Not on file    Active member of club or organization: Not on file    Attends meetings of clubs or organizations: Not on  file    Relationship status: Not on file  . Intimate partner violence    Fear of current or ex partner: Not on file    Emotionally abused: Not on file    Physically abused: Not on file    Forced sexual activity: Not on file  Other Topics Concern  . Not on file  Social History Narrative  . Not on file    Current Outpatient Medications on File Prior to Visit  Medication Sig Dispense Refill  . ACCU-CHEK SMARTVIEW test strip USE TO CHECK BLOOD SUGAR 4 TIMES PER DAY 200 each 2  . aspirin EC 81 MG tablet Take 81 mg by mouth daily.    Marland Kitchen atorvastatin (LIPITOR) 40 MG tablet Take 40 mg by mouth daily.     . Blood Glucose Monitoring Suppl (ACCU-CHEK NANO SMARTVIEW) w/Device KIT 1 each by Does not apply route daily. 1 kit 0  . lisinopril (PRINIVIL,ZESTRIL) 5 MG tablet     . metFORMIN (GLUCOPHAGE) 500 MG tablet Take 5 tablets (2,500 mg total) by mouth daily. 150 tablet 2   No current facility-administered medications on file prior  to visit.     No Known Allergies  Family History  Problem Relation Age of Onset  . Diabetes Mother     BP (!) 110/50 (BP Location: Right Arm, Patient Position: Sitting, Cuff Size: Large)   Pulse 86   Ht 5' 11"  (1.803 m)   Wt 186 lb 6.4 oz (84.6 kg)   SpO2 96%   BMI 26.00 kg/m    Review of Systems He denies hypoglycemia    Objective:   Physical Exam VITAL SIGNS:  See vs page GENERAL: no distress Pulses: dorsalis pedis intact bilat.   MSK: no deformity of the feet CV: no leg edema Skin:  no ulcer on the feet.  normal color and temp on the feet. Neuro: sensation is intact to touch on the feet.   Lab Results  Component Value Date   CREATININE 1.13 03/07/2017   BUN 26 (H) 03/07/2017   NA 132 (L) 03/07/2017   K 4.2 03/07/2017   CL 101 03/07/2017   CO2 24 03/07/2017   Lab Results  Component Value Date   HGBA1C 8.5 (A) 10/28/2019       Assessment & Plan:  Type 2 DM, with PN: worse Renal insuff: recheck today  Patient Instructions  check  your blood sugar once a day.  vary the time of day when you check, between before the 3 meals, and at bedtime.  also check if you have symptoms of your blood sugar being too high or too low.  please keep a record of the readings and bring it to your next appointment here (or you can bring the meter itself).  You can write it on any piece of paper.  please call us sooner if your blood sugar goes below 70, or if you have a lot of readings over 200.   I have sent a prescription to your pharmacy, to add "Januvia," and to reduce the glyburide.  Please continue the same other diabetes medications.   Please come back for a follow-up appointment in 2 months.

## 2019-10-28 NOTE — Patient Instructions (Addendum)
check your blood sugar once a day.  vary the time of day when you check, between before the 3 meals, and at bedtime.  also check if you have symptoms of your blood sugar being too high or too low.  please keep a record of the readings and bring it to your next appointment here (or you can bring the meter itself).  You can write it on any piece of paper.  please call us sooner if your blood sugar goes below 70, or if you have a lot of readings over 200.   I have sent a prescription to your pharmacy, to add "Januvia," and to reduce the glyburide.  Please continue the same other diabetes medications.   Please come back for a follow-up appointment in 2 months.

## 2019-11-01 ENCOUNTER — Encounter: Payer: Self-pay | Admitting: Endocrinology

## 2019-11-22 DIAGNOSIS — I10 Essential (primary) hypertension: Secondary | ICD-10-CM | POA: Diagnosis not present

## 2019-11-22 DIAGNOSIS — G4733 Obstructive sleep apnea (adult) (pediatric): Secondary | ICD-10-CM | POA: Diagnosis not present

## 2019-11-22 DIAGNOSIS — E1142 Type 2 diabetes mellitus with diabetic polyneuropathy: Secondary | ICD-10-CM | POA: Diagnosis not present

## 2019-11-22 DIAGNOSIS — Z1211 Encounter for screening for malignant neoplasm of colon: Secondary | ICD-10-CM | POA: Diagnosis not present

## 2019-11-22 DIAGNOSIS — E782 Mixed hyperlipidemia: Secondary | ICD-10-CM | POA: Diagnosis not present

## 2019-11-22 DIAGNOSIS — Z0000 Encounter for general adult medical examination without abnormal findings: Secondary | ICD-10-CM | POA: Diagnosis not present

## 2019-11-23 DIAGNOSIS — Z1211 Encounter for screening for malignant neoplasm of colon: Secondary | ICD-10-CM | POA: Diagnosis not present

## 2020-01-06 ENCOUNTER — Ambulatory Visit: Payer: Medicare Other | Admitting: Endocrinology

## 2020-01-11 ENCOUNTER — Other Ambulatory Visit: Payer: Self-pay | Admitting: Endocrinology

## 2020-01-26 ENCOUNTER — Other Ambulatory Visit: Payer: Self-pay | Admitting: Endocrinology

## 2020-02-04 ENCOUNTER — Other Ambulatory Visit: Payer: Self-pay

## 2020-02-08 ENCOUNTER — Ambulatory Visit (INDEPENDENT_AMBULATORY_CARE_PROVIDER_SITE_OTHER): Payer: Medicare Other | Admitting: Endocrinology

## 2020-02-08 ENCOUNTER — Other Ambulatory Visit: Payer: Self-pay

## 2020-02-08 ENCOUNTER — Encounter: Payer: Self-pay | Admitting: Endocrinology

## 2020-02-08 VITALS — BP 128/72 | HR 80 | Ht 71.0 in | Wt 187.0 lb

## 2020-02-08 DIAGNOSIS — E1142 Type 2 diabetes mellitus with diabetic polyneuropathy: Secondary | ICD-10-CM

## 2020-02-08 LAB — POCT GLYCOSYLATED HEMOGLOBIN (HGB A1C): Hemoglobin A1C: 8.2 % — AB (ref 4.0–5.6)

## 2020-02-08 NOTE — Progress Notes (Signed)
Subjective:    Patient ID: Nathan Duncan, male    DOB: Jun 20, 1951, 69 y.o.   MRN: [id number]  HPI Pt returns for f/u of diabetes mellitus: DM type: 2 (although he may be developing type 1).   Dx'ed: 4098. Complications: PN and renal insuff  Therapy: 4 oral meds.  DKA: never.  Severe hypoglycemia: once, in the 1980's--he does not know what med or meds he was taking then.  Pancreatitis: never.  Other: he has never been on insulin, be he has learned how to take; he declines to change glyburide to shorter-acting med; he does not check cbg's; he is retired.    Interval history: pt states he feels well in general.  He takes meds as rx'ed.  He brings a record of his cbg's which I have reviewed today. all are in the 100's.  Pt says if he took insulin, he would be willing to take just qd.   Past Medical History:  Diagnosis Date  . Diabetes ([place])   . Dyslipidemia     No past surgical history on file.  Social History   Socioeconomic History  . Marital status: Married    Spouse name: Not on file  . Number of children: Not on file  . Years of education: Not on file  . Highest education level: Not on file  Occupational History  . Not on file  Tobacco Use  . Smoking status: Former Research scientist (life sciences)  . Smokeless tobacco: Never Used  Substance and Sexual Activity  . Alcohol use: Yes  . Drug use: Not on file  . Sexual activity: Not on file  Other Topics Concern  . Not on file  Social History Narrative  . Not on file   Social Determinants of Health   Financial Resource Strain:   . Difficulty of Paying Living Expenses: Not on file  Food Insecurity:   . Worried About Charity fundraiser in the Last Year: Not on file  . Ran Out of Food in the Last Year: Not on file  Transportation Needs:   . Lack of Transportation (Medical): Not on file  . Lack of Transportation (Non-Medical): Not on file  Physical Activity:   . Days of Exercise per Week: Not on file  . Minutes of Exercise per Session:  Not on file  Stress:   . Feeling of Stress : Not on file  Social Connections:   . Frequency of Communication with Friends and Family: Not on file  . Frequency of Social Gatherings with Friends and Family: Not on file  . Attends Religious Services: Not on file  . Active Member of Clubs or Organizations: Not on file  . Attends Archivist Meetings: Not on file  . Marital Status: Not on file  Intimate Partner Violence:   . Fear of Current or Ex-Partner: Not on file  . Emotionally Abused: Not on file  . Physically Abused: Not on file  . Sexually Abused: Not on file    Current Outpatient Medications on File Prior to Visit  Medication Sig Dispense Refill  . ACCU-CHEK SMARTVIEW test strip USE 1 STRIP TO CHECK GLUCOSE 4 TIMES DAILY 200 each 0  . aspirin EC 81 MG tablet Take 81 mg by mouth daily.    Marland Kitchen atorvastatin (LIPITOR) 40 MG tablet Take 40 mg by mouth daily.     . Blood Glucose Monitoring Suppl (ACCU-CHEK NANO SMARTVIEW) w/Device KIT 1 each by Does not apply route daily. 1 kit 0  . empagliflozin (  JARDIANCE) 25 MG TABS tablet Take 25 mg by mouth daily. 90 tablet 3  . glyBURIDE (DIABETA) 5 MG tablet Take 1 tablet (5 mg total) by mouth daily with breakfast. 90 tablet 3  . lisinopril (PRINIVIL,ZESTRIL) 5 MG tablet     . metFORMIN (GLUCOPHAGE) 500 MG tablet TAKE 5 TABLETS BY MOUTH DAILY 150 tablet 0  . sitaGLIPtin (JANUVIA) 100 MG tablet Take 1 tablet (100 mg total) by mouth daily. 90 tablet 3   No current facility-administered medications on file prior to visit.    No Known Allergies  Family History  Problem Relation Age of Onset  . Diabetes Mother     BP 128/72 (BP Location: Left Arm, Patient Position: Sitting, Cuff Size: Large)   Pulse 80   Ht 5' 11"  (1.803 m)   Wt 187 lb (84.8 kg)   SpO2 98%   BMI 26.08 kg/m   Review of Systems He denies hypoglycemia.      Objective:   Physical Exam VITAL SIGNS:  See vs page GENERAL: no distress Pulses: dorsalis pedis intact  bilat.   MSK: no deformity of the feet CV: no leg edema Skin:  no ulcer on the feet.  normal color and temp on the feet. Neuro: sensation is intact to touch on the feet.    A1c=8.2%.   Lab Results  Component Value Date   CREATININE 1.22 10/28/2019   BUN 23 10/28/2019   NA 137 10/28/2019   K 4.5 10/28/2019   CL 102 10/28/2019   CO2 26 10/28/2019      Assessment & Plan:  type 2 DM, with PN: he needs increased rx. Lean body habitus: we again discussed likely evolution to type 1 DM Renal insuff: This limits rx options.    Patient Instructions  check your blood sugar once a day.  vary the time of day when you check, between before the 3 meals, and at bedtime.  also check if you have symptoms of your blood sugar being too high or too low.  please keep a record of the readings and bring it to your next appointment here (or you can bring the meter itself).  You can write it on any piece of paper.  please call us sooner if your blood sugar goes below 70, or if you have a lot of readings over 200.   Please continue the same medications for now.   Please continue the same other diabetes medications.   Once a day insulins are [place], Lantus, Levemir, Toujeo, and Engineer, agricultural.   Another option is to change Januvia to Rybelsus (once a day pill). Please let me know.   Please come back for a follow-up appointment in 2 months.

## 2020-02-08 NOTE — Patient Instructions (Addendum)
check your blood sugar once a day.  vary the time of day when you check, between before the 3 meals, and at bedtime.  also check if you have symptoms of your blood sugar being too high or too low.  please keep a record of the readings and bring it to your next appointment here (or you can bring the meter itself).  You can write it on any piece of paper.  please call us sooner if your blood sugar goes below 70, or if you have a lot of readings over 200.   Please continue the same medications for now.   Please continue the same other diabetes medications.   Once a day insulins are [place], Lantus, Levemir, Toujeo, and Engineer, agricultural.   Another option is to change Januvia to Rybelsus (once a day pill). Please let me know.   Please come back for a follow-up appointment in 2 months.

## 2020-03-07 ENCOUNTER — Other Ambulatory Visit: Payer: Self-pay | Admitting: Endocrinology

## 2020-04-11 ENCOUNTER — Other Ambulatory Visit: Payer: Self-pay | Admitting: Endocrinology

## 2020-04-21 ENCOUNTER — Other Ambulatory Visit: Payer: Self-pay

## 2020-04-25 ENCOUNTER — Encounter: Payer: Self-pay | Admitting: Endocrinology

## 2020-04-25 ENCOUNTER — Other Ambulatory Visit: Payer: Self-pay

## 2020-04-25 ENCOUNTER — Ambulatory Visit (INDEPENDENT_AMBULATORY_CARE_PROVIDER_SITE_OTHER): Payer: Medicare Other | Admitting: Endocrinology

## 2020-04-25 VITALS — BP 140/80 | HR 72 | Ht 71.0 in | Wt 186.0 lb

## 2020-04-25 DIAGNOSIS — E1142 Type 2 diabetes mellitus with diabetic polyneuropathy: Secondary | ICD-10-CM

## 2020-04-25 LAB — POCT GLYCOSYLATED HEMOGLOBIN (HGB A1C): Hemoglobin A1C: 7.8 % — AB (ref 4.0–5.6)

## 2020-04-25 MED ORDER — RYBELSUS 7 MG PO TABS: 7.0000 mg | ORAL_TABLET | Freq: Every day | ORAL | 11 refills | Status: DC

## 2020-04-25 NOTE — Progress Notes (Signed)
Subjective:    Patient ID: Nathan Duncan, male    DOB: 03-28-1951, 69 y.o.   MRN: [id number]  HPI Pt returns for f/u of diabetes mellitus: DM type: 2 (although he may be developing type 1).   Dx'ed: 2878. Complications: PN and CRI.  Therapy: 4 oral meds.  DKA: never.  Severe hypoglycemia: once, in the 1980's--he does not know what med or meds he was taking then.  Pancreatitis: never.  Other: he has never been on insulin, be he has learned how to take; he declines to change glyburide to shorter-acting med; he does not check cbg's; he is retired; Pt says if he took insulin, he would be willing to take just qd.  Interval history: pt states he feels well in general.  He takes meds as rx'ed.  Pt says cbg varies from 130-150.  Past Medical History:  Diagnosis Date  . Diabetes ([place])   . Dyslipidemia     No past surgical history on file.  Social History   Socioeconomic History  . Marital status: Married    Spouse name: Not on file  . Number of children: Not on file  . Years of education: Not on file  . Highest education level: Not on file  Occupational History  . Not on file  Tobacco Use  . Smoking status: Former Research scientist (life sciences)  . Smokeless tobacco: Never Used  Substance and Sexual Activity  . Alcohol use: Yes  . Drug use: Not on file  . Sexual activity: Not on file  Other Topics Concern  . Not on file  Social History Narrative  . Not on file   Social Determinants of Health   Financial Resource Strain:   . Difficulty of Paying Living Expenses:   Food Insecurity:   . Worried About Charity fundraiser in the Last Year:   . Arboriculturist in the Last Year:   Transportation Needs:   . Film/video editor (Medical):   Marland Kitchen Lack of Transportation (Non-Medical):   Physical Activity:   . Days of Exercise per Week:   . Minutes of Exercise per Session:   Stress:   . Feeling of Stress :   Social Connections:   . Frequency of Communication with Friends and Family:   .  Frequency of Social Gatherings with Friends and Family:   . Attends Religious Services:   . Active Member of Clubs or Organizations:   . Attends Archivist Meetings:   Marland Kitchen Marital Status:   Intimate Partner Violence:   . Fear of Current or Ex-Partner:   . Emotionally Abused:   Marland Kitchen Physically Abused:   . Sexually Abused:     Current Outpatient Medications on File Prior to Visit  Medication Sig Dispense Refill  . ACCU-CHEK SMARTVIEW test strip USE 1 STRIP TO CHECK GLUCOSE 4 TIMES DAILY 200 each 0  . aspirin EC 81 MG tablet Take 81 mg by mouth daily.    Marland Kitchen atorvastatin (LIPITOR) 40 MG tablet Take 40 mg by mouth daily.     . Blood Glucose Monitoring Suppl (ACCU-CHEK NANO SMARTVIEW) w/Device KIT 1 each by Does not apply route daily. 1 kit 0  . empagliflozin (JARDIANCE) 25 MG TABS tablet Take 25 mg by mouth daily. 90 tablet 3  . glyBURIDE (DIABETA) 5 MG tablet Take 1 tablet (5 mg total) by mouth daily with breakfast. 90 tablet 3  . lisinopril (PRINIVIL,ZESTRIL) 5 MG tablet     . metFORMIN (GLUCOPHAGE) 500 MG tablet  TAKE 5 TABLETS BY MOUTH ONCE DAILY 150 tablet 0   No current facility-administered medications on file prior to visit.    No Known Allergies  Family History  Problem Relation Age of Onset  . Diabetes Mother     BP 140/80   Pulse 72   Ht 5' 11" (1.803 m)   Wt 186 lb (84.4 kg)   SpO2 97%   BMI 25.94 kg/m    Review of Systems He denies hypoglycemia    Objective:   Physical Exam VITAL SIGNS:  See vs page GENERAL: no distress Pulses: dorsalis pedis intact bilat.   MSK: no deformity of the feet CV: no leg edema Skin:  no ulcer on the feet.  normal color and temp on the feet. Neuro: sensation is intact to touch on the feet  Lab Results  Component Value Date   HGBA1C 7.8 (A) 04/25/2020   Lab Results  Component Value Date   CREATININE 1.22 10/28/2019   BUN 23 10/28/2019   NA 137 10/28/2019   K 4.5 10/28/2019   CL 102 10/28/2019   CO2 26 10/28/2019        Assessment & Plan:  Type 2 DM: This is the lowest A1c he would shoot for if he took qd insulin, so we'll continue oral rx for now.    HTN: is noted today.  Noncompliance with cbg monitoring: this limits aggressiveness of glycemic control.  Patient Instructions  Your blood pressure is high today.  Please see your primary care provider soon, to have it rechecked check your blood sugar once a day.  vary the time of day when you check, between before the 3 meals, and at bedtime.  also check if you have symptoms of your blood sugar being too high or too low.  please keep a record of the readings and bring it to your next appointment here (or you can bring the meter itself).  You can write it on any piece of paper.  please call us sooner if your blood sugar goes below 70, or if you have a lot of readings over 200.   I have sent a prescription to your pharmacy, to Januvia to Rybelsus (once a day pill).    Please come back for a follow-up appointment in 2-3 months.

## 2020-04-25 NOTE — Patient Instructions (Addendum)
Your blood pressure is high today.  Please see your primary care provider soon, to have it rechecked check your blood sugar once a day.  vary the time of day when you check, between before the 3 meals, and at bedtime.  also check if you have symptoms of your blood sugar being too high or too low.  please keep a record of the readings and bring it to your next appointment here (or you can bring the meter itself).  You can write it on any piece of paper.  please call us sooner if your blood sugar goes below 70, or if you have a lot of readings over 200.   I have sent a prescription to your pharmacy, to Januvia to Rybelsus (once a day pill).    Please come back for a follow-up appointment in 2-3 months.

## 2020-05-02 DIAGNOSIS — H5212 Myopia, left eye: Secondary | ICD-10-CM | POA: Diagnosis not present

## 2020-05-02 DIAGNOSIS — H52222 Regular astigmatism, left eye: Secondary | ICD-10-CM | POA: Diagnosis not present

## 2020-05-02 DIAGNOSIS — E119 Type 2 diabetes mellitus without complications: Secondary | ICD-10-CM | POA: Diagnosis not present

## 2020-05-02 DIAGNOSIS — H35372 Puckering of macula, left eye: Secondary | ICD-10-CM | POA: Diagnosis not present

## 2020-05-02 DIAGNOSIS — E113292 Type 2 diabetes mellitus with mild nonproliferative diabetic retinopathy without macular edema, left eye: Secondary | ICD-10-CM | POA: Diagnosis not present

## 2020-05-02 DIAGNOSIS — H524 Presbyopia: Secondary | ICD-10-CM | POA: Diagnosis not present

## 2020-05-02 DIAGNOSIS — H52221 Regular astigmatism, right eye: Secondary | ICD-10-CM | POA: Diagnosis not present

## 2020-05-02 DIAGNOSIS — H2513 Age-related nuclear cataract, bilateral: Secondary | ICD-10-CM | POA: Diagnosis not present

## 2020-05-03 ENCOUNTER — Telehealth: Payer: Self-pay | Admitting: Endocrinology

## 2020-05-03 ENCOUNTER — Encounter: Payer: Self-pay | Admitting: Endocrinology

## 2020-05-03 DIAGNOSIS — H3582 Retinal ischemia: Secondary | ICD-10-CM

## 2020-05-03 NOTE — Telephone Encounter (Signed)
Attempted to call the phone # provided but a male automated voice states the number you have dialed is not in service.

## 2020-05-03 NOTE — Telephone Encounter (Signed)
[facility] called to report out a stat exam. Please call Dr Posey Pronto at [phone]

## 2020-05-04 DIAGNOSIS — H3582 Retinal ischemia: Secondary | ICD-10-CM | POA: Insufficient documentation

## 2020-05-04 NOTE — Telephone Encounter (Signed)
Dr. Posey Pronto from [facility] called re: she did not receive a call back. When I told her the ph# we had Dr. Posey Pronto stated the correct ph# is:[phone]. Dr. Posey Pronto wants to discuss findings of patient's eye exam. Please call Dr Posey Pronto at [phone]

## 2020-05-04 NOTE — Telephone Encounter (Signed)
Dr. Posey Pronto called again and wanted to alert Dr. 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 She is wondering if there may be some carotid involvement, and she would like Dr. Dwyane Dee to consider ordering a carotid ultrasound and/or vascular testing. The patient has already been set up with Dr. Yolanda Bonine from [facility] and is scheduled to see this provider today.  Please advise.

## 2020-05-04 NOTE — Telephone Encounter (Signed)
Please advise Dr Posey Pronto: If you feel pt needs a test, please order it.

## 2020-05-05 NOTE — Telephone Encounter (Signed)
Called Dr. Serita Grit office this morning. Advised about Dr. Cordelia Pen response below. Verbalized acceptance and understanding.

## 2020-05-11 NOTE — Telephone Encounter (Signed)
Please call to speak with Dr. Posey Pronto directly  [phone]

## 2020-05-11 NOTE — Telephone Encounter (Signed)
I spoke to Dr Posey Pronto, and explained that I am not primary care provider.

## 2020-05-11 NOTE — Telephone Encounter (Signed)
Dr Posey Pronto called again - I informed her of the previous messages on this encounter. She would like Dr Loanne Drilling to please give her a call asap to "clear the confusion" I did tell her what Dr Cordelia Pen last telephone note said. She still requested that he call her at [phone].

## 2020-05-15 ENCOUNTER — Other Ambulatory Visit: Payer: Self-pay | Admitting: Endocrinology

## 2020-05-16 ENCOUNTER — Other Ambulatory Visit: Payer: Self-pay | Admitting: Family Medicine

## 2020-06-08 ENCOUNTER — Other Ambulatory Visit: Payer: Self-pay | Admitting: Family Medicine

## 2020-06-08 DIAGNOSIS — H3562 Retinal hemorrhage, left eye: Secondary | ICD-10-CM

## 2020-06-20 ENCOUNTER — Ambulatory Visit
Admission: RE | Admit: 2020-06-20 | Discharge: 2020-06-20 | Disposition: A | Discharge status: Home / Self Care | Payer: Medicare Other | Source: Ambulatory Visit | Attending: Family Medicine | Admitting: Family Medicine

## 2020-06-20 DIAGNOSIS — H3562 Retinal hemorrhage, left eye: Secondary | ICD-10-CM

## 2020-06-30 ENCOUNTER — Other Ambulatory Visit: Payer: Self-pay | Admitting: Endocrinology

## 2020-07-24 ENCOUNTER — Other Ambulatory Visit: Payer: Self-pay | Admitting: Endocrinology

## 2020-07-28 ENCOUNTER — Ambulatory Visit: Payer: Medicare Other | Admitting: Endocrinology

## 2020-08-24 ENCOUNTER — Other Ambulatory Visit: Payer: Self-pay | Admitting: Endocrinology

## 2020-08-25 ENCOUNTER — Ambulatory Visit (INDEPENDENT_AMBULATORY_CARE_PROVIDER_SITE_OTHER): Payer: Medicare Other | Admitting: Endocrinology

## 2020-08-25 ENCOUNTER — Other Ambulatory Visit: Payer: Self-pay

## 2020-08-25 ENCOUNTER — Encounter: Payer: Self-pay | Admitting: Endocrinology

## 2020-08-25 VITALS — BP 148/74 | HR 62 | Ht 71.0 in | Wt 183.8 lb

## 2020-08-25 DIAGNOSIS — E1142 Type 2 diabetes mellitus with diabetic polyneuropathy: Secondary | ICD-10-CM

## 2020-08-25 LAB — POCT GLYCOSYLATED HEMOGLOBIN (HGB A1C): Hemoglobin A1C: 7.9 % — AB (ref 4.0–5.6)

## 2020-08-25 MED ORDER — SITAGLIPTIN PHOSPHATE 100 MG PO TABS: 100.0000 mg | ORAL_TABLET | Freq: Every day | ORAL | 3 refills | Status: DC

## 2020-08-25 NOTE — Patient Instructions (Addendum)
Your blood pressure is high today.  Please see your primary care provider soon, to have it rechecked check your blood sugar once a day.  vary the time of day when you check, between before the 3 meals, and at bedtime.  also check if you have symptoms of your blood sugar being too high or too low.  please keep a record of the readings and bring it to your next appointment here (or you can bring the meter itself).  You can write it on any piece of paper.  please call us sooner if your blood sugar goes below 70, or if you have a lot of readings over 200.   Please continue the same medications.      Please come back for a follow-up appointment in 2-3 months.

## 2020-08-25 NOTE — Progress Notes (Signed)
Subjective:    Patient ID: Nathan Duncan, male    DOB: 04-18-1951, 69 y.o.   MRN: [id number]  HPI Pt returns for f/u of diabetes mellitus: DM type: 2 (although he may be developing type 1).   Dx'ed: 6226. Complications: PN and CRI.  Therapy: 4 oral meds.  DKA: never.  Severe hypoglycemia: once, in the 1980's--he does not know what med or meds he was taking then.  Pancreatitis: never.  Other: he has never been on insulin, be he has learned how to take; he declines to change glyburide to shorter-acting med; he does not check cbg's; he is retired; Pt says if he took insulin, he would be willing to take just qd.   Interval history: pt states he feels well in general.  He takes meds as rx'ed.  He brings a record of his cbg's which I have reviewed today.  cbg varies from 121-175. He did not take Rybelsus, due to cost.   Past Medical History:  Diagnosis Date   Diabetes ([place])    Dyslipidemia     No past surgical history on file.  Social History   Socioeconomic History   Marital status: Married    Spouse name: Not on file   Number of children: Not on file   Years of education: Not on file   Highest education level: Not on file  Occupational History   Not on file  Tobacco Use   Smoking status: Former Smoker   Smokeless tobacco: Never Used  Substance and Sexual Activity   Alcohol use: Yes   Drug use: Not on file   Sexual activity: Not on file  Other Topics Concern   Not on file  Social History Narrative   Not on file   Social Determinants of Health   Financial Resource Strain:    Difficulty of Paying Living Expenses: Not on file  Food Insecurity:    Worried About [place] in the Last Year: Not on file   Ran Out of Food in the Last Year: Not on file  Transportation Needs:    Lack of Transportation (Medical): Not on file   Lack of Transportation (Non-Medical): Not on file  Physical Activity:    Days of Exercise per Week: Not on file    Minutes of Exercise per Session: Not on file  Stress:    Feeling of Stress : Not on file  Social Connections:    Frequency of Communication with Friends and Family: Not on file   Frequency of Social Gatherings with Friends and Family: Not on file   Attends Religious Services: Not on file   Active Member of [place] or Organizations: Not on file   Attends Archivist Meetings: Not on file   Marital Status: Not on file  Intimate Partner Violence:    Fear of Current or Ex-Partner: Not on file   Emotionally Abused: Not on file   Physically Abused: Not on file   Sexually Abused: Not on file    Current Outpatient Medications on File Prior to Visit  Medication Sig Dispense Refill   ACCU-CHEK SMARTVIEW test strip USE 1 STRIP TO CHECK GLUCOSE 4 TIMES DAILY 200 each 0   aspirin EC 81 MG tablet Take 81 mg by mouth daily.     atorvastatin (LIPITOR) 40 MG tablet Take 40 mg by mouth daily.      Blood Glucose Monitoring Suppl (ACCU-CHEK NANO SMARTVIEW) w/Device KIT 1 each by Does not apply route daily. 1  kit 0   empagliflozin (JARDIANCE) 25 MG TABS tablet Take 25 mg by mouth daily. 90 tablet 3   glyBURIDE (DIABETA) 5 MG tablet Take 1 tablet (5 mg total) by mouth daily with breakfast. 90 tablet 3   lisinopril (PRINIVIL,ZESTRIL) 5 MG tablet      metFORMIN (GLUCOPHAGE) 500 MG tablet TAKE 5 TABLETS BY MOUTH ONCE DAILY 150 tablet 0   Semaglutide (RYBELSUS) 7 MG TABS Take 7 mg by mouth daily. 30 tablet 11   No current facility-administered medications on file prior to visit.    No Known Allergies  Family History  Problem Relation Age of Onset   Diabetes Mother     BP (!) 148/74    Pulse 62    Ht 5' 11"  (1.803 m)    Wt 183 lb 12.8 oz (83.4 kg)    SpO2 97%    BMI 25.63 kg/m    Review of Systems He denies hypoglycemia    Objective:   Physical Exam VITAL SIGNS:  See vs page GENERAL: no distress Pulses: dorsalis pedis intact bilat.   MSK: no deformity of the feet.    CV: no leg edema.   Skin:  no ulcer on the feet.  normal color and temp on the feet. Neuro: sensation is intact to touch on the feet  Lab Results  Component Value Date   HGBA1C 7.9 (A) 08/25/2020   Lab Results  Component Value Date   CREATININE 1.22 10/28/2019   BUN 23 10/28/2019   NA 137 10/28/2019   K 4.5 10/28/2019   CL 102 10/28/2019   CO2 26 10/28/2019      Assessment & Plan:  Type 2 DM, with CRI: worse.  We discussed.  He declines to increase meds.  HTN: is noted today  Patient Instructions  Your blood pressure is high today.  Please see your primary care provider soon, to have it rechecked check your blood sugar once a day.  vary the time of day when you check, between before the 3 meals, and at bedtime.  also check if you have symptoms of your blood sugar being too high or too low.  please keep a record of the readings and bring it to your next appointment here (or you can bring the meter itself).  You can write it on any piece of paper.  please call us sooner if your blood sugar goes below 70, or if you have a lot of readings over 200.   Please continue the same medications.      Please come back for a follow-up appointment in 2-3 months.

## 2020-09-05 ENCOUNTER — Other Ambulatory Visit: Payer: Self-pay | Admitting: Endocrinology

## 2020-09-28 DIAGNOSIS — Z23 Encounter for immunization: Secondary | ICD-10-CM | POA: Diagnosis not present

## 2020-10-11 ENCOUNTER — Other Ambulatory Visit: Payer: Self-pay | Admitting: Endocrinology

## 2020-10-13 DIAGNOSIS — Z23 Encounter for immunization: Secondary | ICD-10-CM | POA: Diagnosis not present

## 2020-11-07 ENCOUNTER — Other Ambulatory Visit: Payer: Self-pay | Admitting: Endocrinology

## 2020-11-08 NOTE — Telephone Encounter (Signed)
Please advise 

## 2020-12-12 ENCOUNTER — Other Ambulatory Visit: Payer: Self-pay | Admitting: Endocrinology

## 2020-12-27 ENCOUNTER — Ambulatory Visit: Payer: Medicare Other | Admitting: Endocrinology

## 2021-01-02 DIAGNOSIS — Z125 Encounter for screening for malignant neoplasm of prostate: Secondary | ICD-10-CM | POA: Diagnosis not present

## 2021-01-02 DIAGNOSIS — Z7984 Long term (current) use of oral hypoglycemic drugs: Secondary | ICD-10-CM | POA: Diagnosis not present

## 2021-01-02 DIAGNOSIS — E119 Type 2 diabetes mellitus without complications: Secondary | ICD-10-CM | POA: Diagnosis not present

## 2021-01-02 DIAGNOSIS — G4733 Obstructive sleep apnea (adult) (pediatric): Secondary | ICD-10-CM | POA: Diagnosis not present

## 2021-01-02 DIAGNOSIS — Z23 Encounter for immunization: Secondary | ICD-10-CM | POA: Diagnosis not present

## 2021-01-02 DIAGNOSIS — Z0000 Encounter for general adult medical examination without abnormal findings: Secondary | ICD-10-CM | POA: Diagnosis not present

## 2021-01-02 DIAGNOSIS — E1142 Type 2 diabetes mellitus with diabetic polyneuropathy: Secondary | ICD-10-CM | POA: Diagnosis not present

## 2021-01-02 DIAGNOSIS — E782 Mixed hyperlipidemia: Secondary | ICD-10-CM | POA: Diagnosis not present

## 2021-01-02 DIAGNOSIS — I10 Essential (primary) hypertension: Secondary | ICD-10-CM | POA: Diagnosis not present

## 2021-01-16 ENCOUNTER — Ambulatory Visit: Payer: Medicare Other | Admitting: Endocrinology

## 2021-01-17 ENCOUNTER — Other Ambulatory Visit: Payer: Self-pay | Admitting: Endocrinology

## 2021-01-23 ENCOUNTER — Other Ambulatory Visit: Payer: Self-pay | Admitting: Endocrinology

## 2021-02-20 ENCOUNTER — Ambulatory Visit (INDEPENDENT_AMBULATORY_CARE_PROVIDER_SITE_OTHER): Payer: Medicare Other | Admitting: Endocrinology

## 2021-02-20 ENCOUNTER — Other Ambulatory Visit: Payer: Self-pay

## 2021-02-20 VITALS — BP 150/84 | HR 77 | Ht 71.5 in | Wt 180.4 lb

## 2021-02-20 DIAGNOSIS — E1142 Type 2 diabetes mellitus with diabetic polyneuropathy: Secondary | ICD-10-CM | POA: Diagnosis not present

## 2021-02-20 LAB — BASIC METABOLIC PANEL
BUN: 22 mg/dL (ref 6–23)
CO2: 25 mEq/L (ref 19–32)
Calcium: 9.4 mg/dL (ref 8.4–10.5)
Chloride: 102 mEq/L (ref 96–112)
Creatinine, Ser: 1.13 mg/dL (ref 0.40–1.50)
GFR: 65.12 mL/min (ref >60.00)
Glucose, Bld: 139 mg/dL — ABNORMAL HIGH (ref 70–99)
Potassium: 4.2 mEq/L (ref 3.5–5.1)
Sodium: 136 mEq/L (ref 135–145)

## 2021-02-20 LAB — TSH: TSH: 1.21 u[IU]/mL (ref 0.35–4.50)

## 2021-02-20 LAB — POCT GLYCOSYLATED HEMOGLOBIN (HGB A1C): Hemoglobin A1C: 7.8 % — AB (ref 4.0–5.6)

## 2021-02-20 MED ORDER — METFORMIN HCL ER 500 MG PO TB24: 2000.0000 mg | ORAL_TABLET | Freq: Every day | ORAL | 3 refills | Status: DC

## 2021-02-20 NOTE — Patient Instructions (Addendum)
Your blood pressure is high today.  Please see your primary care provider soon, to have it rechecked check your blood sugar once a day.  vary the time of day when you check, between before the 3 meals, and at bedtime.  also check if you have symptoms of your blood sugar being too high or too low.  please keep a record of the readings and bring it to your next appointment here (or you can bring the meter itself).  You can write it on any piece of paper.  please call us sooner if your blood sugar goes below 70, or if you have a lot of readings over 200.   Blood tests are requested for you today.  We'll let you know about the results.  I have sent a prescription to your pharmacy, to reduce the metformin to 4 pills per day.   Please continue the same other medications.    Please come back for a follow-up appointment in 3 months.

## 2021-02-20 NOTE — Progress Notes (Signed)
Subjective:    Patient ID: Nathan Duncan, male    DOB: 08-23-1951, 70 y.o.   MRN: [id number]  HPI Pt returns for f/u of diabetes mellitus:  DM type: 2 (although he may be developing type 1).   Dx'ed: 8756. Complications: PN and stage 3 CRI.  Therapy: 4 oral meds.  DKA: never.  Severe hypoglycemia: once, in the 1980's--he does not know what med or meds he was taking then.  Pancreatitis: never.  SDOH: He did not take Rybelsus, due to cost.  Other: he has never been on insulin, be he has learned how to take; he declines to change glyburide to shorter-acting med; he is retired; Pt says if he took insulin, he would be willing to take just qd.   Interval history: pt states he feels well in general.  He takes meds as rx'ed.  He brings a record of his fasting cbg's which I have reviewed today.  cbg varies from 97-199.   Past Medical History:  Diagnosis Date  . Diabetes ([place])   . Dyslipidemia     No past surgical history on file.  Social History   Socioeconomic History  . Marital status: Married    Spouse name: Not on file  . Number of children: Not on file  . Years of education: Not on file  . Highest education level: Not on file  Occupational History  . Not on file  Tobacco Use  . Smoking status: Former Research scientist (life sciences)  . Smokeless tobacco: Never Used  Substance and Sexual Activity  . Alcohol use: Yes  . Drug use: Not on file  . Sexual activity: Not on file  Other Topics Concern  . Not on file  Social History Narrative  . Not on file   Social Determinants of Health   Financial Resource Strain: Not on file  Food Insecurity: Not on file  Transportation Needs: Not on file  Physical Activity: Not on file  Stress: Not on file  Social Connections: Not on file  Intimate Partner Violence: Not on file    Current Outpatient Medications on File Prior to Visit  Medication Sig Dispense Refill  . ACCU-CHEK SMARTVIEW test strip USE 1 STRIP TO CHECK GLUCOSE 4 TIMES DAILY 200 each 0   . aspirin EC 81 MG tablet Take 81 mg by mouth daily.    Marland Kitchen atorvastatin (LIPITOR) 40 MG tablet Take 40 mg by mouth daily.     . Blood Glucose Monitoring Suppl (ACCU-CHEK NANO SMARTVIEW) w/Device KIT 1 each by Does not apply route daily. 1 kit 0  . glyBURIDE (DIABETA) 5 MG tablet Take 1 tablet by mouth once daily with breakfast 90 tablet 0  . JANUVIA 100 MG tablet Take 1 tablet by mouth once daily 90 tablet 0  . JARDIANCE 25 MG TABS tablet Take 1 tablet by mouth once daily 90 tablet 0  . lisinopril (PRINIVIL,ZESTRIL) 5 MG tablet      No current facility-administered medications on file prior to visit.    No Known Allergies  Family History  Problem Relation Age of Onset  . Diabetes Mother     BP (!) 150/84 (BP Location: Right Arm, Patient Position: Sitting, Cuff Size: Normal)   Pulse 77   Ht 5' 11.5" (1.816 m)   Wt 180 lb 6.4 oz (81.8 kg)   SpO2 98%   BMI 24.81 kg/m    Review of Systems He denies hypoglycemia.     Objective:   Physical Exam VITAL SIGNS:  See  vs page GENERAL: no distress Pulses: dorsalis pedis intact bilat.   MSK: no deformity of the feet CV: no leg edema Skin:  no ulcer on the feet.  normal color and temp on the feet.   Neuro: sensation is intact to touch on the feet.   Lab Results  Component Value Date   HGBA1C 7.8 (A) 02/20/2021   Lab Results  Component Value Date   CREATININE 1.22 10/28/2019   BUN 23 10/28/2019   NA 137 10/28/2019   K 4.5 10/28/2019   CL 102 10/28/2019   CO2 26 10/28/2019        Assessment & Plan:  HTN: is noted today Type 2 DM: he needs increased rx, but he declines.  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   Patient Instructions  Your blood pressure is high today.  Please see your primary care provider soon, to have it rechecked check your blood sugar once a day.  vary the time of day when you check, between before the 3 meals, and at bedtime.  also check if you have symptoms of your blood sugar being too high or  too low.  please keep a record of the readings and bring it to your next appointment here (or you can bring the meter itself).  You can write it on any piece of paper.  please call us sooner if your blood sugar goes below 70, or if you have a lot of readings over 200.   Blood tests are requested for you today.  We'll let you know about the results.  I have sent a prescription to your pharmacy, to reduce the metformin to 4 pills per day.   Please continue the same other medications.    Please come back for a follow-up appointment in 3 months.

## 2021-02-21 LAB — T4, FREE: Free T4: 1.15 ng/dL (ref 0.60–1.60)

## 2021-02-27 ENCOUNTER — Other Ambulatory Visit: Payer: Self-pay | Admitting: Endocrinology

## 2021-03-07 ENCOUNTER — Other Ambulatory Visit: Payer: Self-pay | Admitting: Endocrinology

## 2021-04-16 ENCOUNTER — Other Ambulatory Visit: Payer: Self-pay | Admitting: Endocrinology

## 2021-05-08 DIAGNOSIS — H02831 Dermatochalasis of right upper eyelid: Secondary | ICD-10-CM | POA: Diagnosis not present

## 2021-05-08 DIAGNOSIS — H02834 Dermatochalasis of left upper eyelid: Secondary | ICD-10-CM | POA: Diagnosis not present

## 2021-05-08 DIAGNOSIS — H524 Presbyopia: Secondary | ICD-10-CM | POA: Diagnosis not present

## 2021-05-08 DIAGNOSIS — H52222 Regular astigmatism, left eye: Secondary | ICD-10-CM | POA: Diagnosis not present

## 2021-05-08 DIAGNOSIS — E113293 Type 2 diabetes mellitus with mild nonproliferative diabetic retinopathy without macular edema, bilateral: Secondary | ICD-10-CM | POA: Diagnosis not present

## 2021-05-08 DIAGNOSIS — H35372 Puckering of macula, left eye: Secondary | ICD-10-CM | POA: Diagnosis not present

## 2021-05-08 DIAGNOSIS — H52221 Regular astigmatism, right eye: Secondary | ICD-10-CM | POA: Diagnosis not present

## 2021-05-08 DIAGNOSIS — H2513 Age-related nuclear cataract, bilateral: Secondary | ICD-10-CM | POA: Diagnosis not present

## 2021-05-08 DIAGNOSIS — H5212 Myopia, left eye: Secondary | ICD-10-CM | POA: Diagnosis not present

## 2021-05-25 ENCOUNTER — Other Ambulatory Visit: Payer: Self-pay

## 2021-05-25 ENCOUNTER — Ambulatory Visit (INDEPENDENT_AMBULATORY_CARE_PROVIDER_SITE_OTHER): Payer: Medicare Other | Admitting: Endocrinology

## 2021-05-25 VITALS — BP 120/60 | HR 60 | Ht 71.5 in | Wt 180.0 lb

## 2021-05-25 DIAGNOSIS — E1142 Type 2 diabetes mellitus with diabetic polyneuropathy: Secondary | ICD-10-CM | POA: Diagnosis not present

## 2021-05-25 LAB — POCT GLYCOSYLATED HEMOGLOBIN (HGB A1C): Hemoglobin A1C: 8.2 % — AB (ref 4.0–5.6)

## 2021-05-25 MED ORDER — PIOGLITAZONE HCL 45 MG PO TABS: 45.0000 mg | ORAL_TABLET | Freq: Every day | ORAL | 3 refills | Status: DC

## 2021-05-25 NOTE — Progress Notes (Signed)
Subjective:    Patient ID: Nathan Duncan, male    DOB: 09-Jan-1951, 70 y.o.   MRN: [id number]  HPI Pt returns for f/u of diabetes mellitus:  DM type: 2 (although he may be developing type 1).   Dx'ed: 3419. Complications: PN, DR, and stage 3 CRI.  Therapy: 4 oral meds.  DKA: never.  Severe hypoglycemia: once, in the 1980's--he does not know what med or meds he was taking then.  Pancreatitis: never.  SDOH: He did not take Rybelsus, due to cost.  Other: he has never been on insulin, be he has learned how to take; he declines to change glyburide to shorter-acting med; he is retired; Pt says if he took insulin, he would be willing to take just qd.   Interval history: pt states he feels well in general.  He takes meds as rx'ed.  Meter is downloaded today, and the printout is scanned into the record.  Fasting cbg varies from 117-149.   Past Medical History:  Diagnosis Date  . Diabetes ([place])   . Dyslipidemia     No past surgical history on file.  Social History   Socioeconomic History  . Marital status: Married    Spouse name: Not on file  . Number of children: Not on file  . Years of education: Not on file  . Highest education level: Not on file  Occupational History  . Not on file  Tobacco Use  . Smoking status: Former Research scientist (life sciences)  . Smokeless tobacco: Never Used  Substance and Sexual Activity  . Alcohol use: Yes  . Drug use: Not on file  . Sexual activity: Not on file  Other Topics Concern  . Not on file  Social History Narrative  . Not on file   Social Determinants of Health   Financial Resource Strain: Not on file  Food Insecurity: Not on file  Transportation Needs: Not on file  Physical Activity: Not on file  Stress: Not on file  Social Connections: Not on file  Intimate Partner Violence: Not on file    Current Outpatient Medications on File Prior to Visit  Medication Sig Dispense Refill  . ACCU-CHEK SMARTVIEW test strip USE 1 STRIP TO CHECK GLUCOSE 4 TIMES  DAILY 200 each 0  . aspirin EC 81 MG tablet Take 81 mg by mouth daily.    Marland Kitchen atorvastatin (LIPITOR) 40 MG tablet Take 40 mg by mouth daily.     . Blood Glucose Monitoring Suppl (ACCU-CHEK NANO SMARTVIEW) w/Device KIT 1 each by Does not apply route daily. 1 kit 0  . glyBURIDE (DIABETA) 5 MG tablet Take 1 tablet by mouth once daily with breakfast 90 tablet 0  . JANUVIA 100 MG tablet Take 1 tablet by mouth once daily 90 tablet 0  . JARDIANCE 25 MG TABS tablet Take 1 tablet by mouth once daily 90 tablet 0  . lisinopril (PRINIVIL,ZESTRIL) 5 MG tablet     . metFORMIN (GLUCOPHAGE-XR) 500 MG 24 hr tablet Take 4 tablets (2,000 mg total) by mouth daily with breakfast. 360 tablet 3   No current facility-administered medications on file prior to visit.    No Known Allergies  Family History  Problem Relation Age of Onset  . Diabetes Mother     BP 120/60 (BP Location: Right Arm, Patient Position: Sitting, Cuff Size: Normal)   Pulse 60   Ht 5' 11.5" (1.816 m)   Wt 180 lb (81.6 kg)   SpO2 97%   BMI 24.76 kg/m  Review of Systems     Objective:   Physical Exam VITAL SIGNS:  See vs page GENERAL: no distress Pulses: dorsalis pedis intact bilat.   MSK: no deformity of the feet CV: no leg edema Skin:  no ulcer on the feet.  normal color and temp on the feet. Neuro: sensation is intact to touch on the feet.    A1c=8.2%  Lab Results  Component Value Date   CREATININE 1.13 02/20/2021   BUN 22 02/20/2021   NA 136 02/20/2021   K 4.2 02/20/2021   CL 102 02/20/2021   CO2 25 02/20/2021       Assessment & Plan:  Type 2 DM: uncontrolled.    Patient Instructions  Your blood pressure is high today.  Please see your primary care provider soon, to have it rechecked check your blood sugar once a day.  vary the time of day when you check, between before the 3 meals, and at bedtime.  also check if you have symptoms of your blood sugar being too high or too low.  please keep a record of the  readings and bring it to your next appointment here (or you can bring the meter itself).  You can write it on any piece of paper.  please call us sooner if your blood sugar goes below 70, or if you have a lot of readings over 200.   I have sent a prescription to your pharmacy, to add pioglitazone.    Please continue the same other medications.   Please come back for a follow-up appointment in 3 months.

## 2021-05-25 NOTE — Patient Instructions (Addendum)
Your blood pressure is high today.  Please see your primary care provider soon, to have it rechecked check your blood sugar once a day.  vary the time of day when you check, between before the 3 meals, and at bedtime.  also check if you have symptoms of your blood sugar being too high or too low.  please keep a record of the readings and bring it to your next appointment here (or you can bring the meter itself).  You can write it on any piece of paper.  please call us sooner if your blood sugar goes below 70, or if you have a lot of readings over 200.   I have sent a prescription to your pharmacy, to add pioglitazone.    Please continue the same other medications.   Please come back for a follow-up appointment in 3 months.

## 2021-06-18 ENCOUNTER — Other Ambulatory Visit: Payer: Self-pay | Admitting: Endocrinology

## 2021-07-16 ENCOUNTER — Other Ambulatory Visit: Payer: Self-pay | Admitting: Endocrinology

## 2021-08-30 ENCOUNTER — Ambulatory Visit (INDEPENDENT_AMBULATORY_CARE_PROVIDER_SITE_OTHER): Payer: Medicare Other | Admitting: Endocrinology

## 2021-08-30 ENCOUNTER — Other Ambulatory Visit: Payer: Self-pay

## 2021-08-30 VITALS — BP 150/70 | HR 58 | Ht 71.5 in | Wt 185.2 lb

## 2021-08-30 DIAGNOSIS — E1142 Type 2 diabetes mellitus with diabetic polyneuropathy: Secondary | ICD-10-CM

## 2021-08-30 LAB — POCT GLYCOSYLATED HEMOGLOBIN (HGB A1C): Hemoglobin A1C: 7.4 % — AB (ref 4.0–5.6)

## 2021-08-30 MED ORDER — GLYBURIDE 2.5 MG PO TABS
2.5000 mg | ORAL_TABLET | Freq: Every day | ORAL | 3 refills | Status: DC
Start: 2021-08-30 — End: 2022-02-12

## 2021-08-30 NOTE — Progress Notes (Signed)
Subjective:    Patient ID: MOMIN MISKO, male    DOB: [birth year], 70 y.o.   MRN: [id number]  HPI Pt returns for f/u of diabetes mellitus:  DM type: 2 (although he may be developing type 1).   Dx'ed: 4128. Complications: PN, DR, and stage 3 CRI.  Therapy: 5 oral meds.  DKA: never.  Severe hypoglycemia: once, in the 1980's--he does not know what med or meds he was taking then.  Pancreatitis: never.  SDOH: He did not take Rybelsus, due to cost.  Other: he has never been on insulin, be he has learned how to take; he declines to change glyburide to shorter-acting med; he is retired; Pt says if he took insulin, he would be willing to take just qd.   Interval history: pt states he feels well in general, except for fatigue.  He takes meds as rx'ed.  He brings his meter with his cbg's which I have reviewed today.  cbg varies from 100-174.   Past Medical History:  Diagnosis Date   Diabetes ([place])    Dyslipidemia     No past surgical history on file.  Social History   Socioeconomic History   Marital status: Married    Spouse name: Not on file   Number of children: Not on file   Years of education: Not on file   Highest education level: Not on file  Occupational History   Not on file  Tobacco Use   Smoking status: Former   Smokeless tobacco: Never  Substance and Sexual Activity   Alcohol use: Yes   Drug use: Not on file   Sexual activity: Not on file  Other Topics Concern   Not on file  Social History Narrative   Not on file   Social Determinants of Health   Financial Resource Strain: Not on file  Food Insecurity: Not on file  Transportation Needs: Not on file  Physical Activity: Not on file  Stress: Not on file  Social Connections: Not on file  Intimate Partner Violence: Not on file    Current Outpatient Medications on File Prior to Visit  Medication Sig Dispense Refill   ACCU-CHEK SMARTVIEW test strip USE 1 STRIP TO CHECK GLUCOSE 4 TIMES DAILY 200 each 0    aspirin EC 81 MG tablet Take 81 mg by mouth daily.     atorvastatin (LIPITOR) 40 MG tablet Take 40 mg by mouth daily.      Blood Glucose Monitoring Suppl (ACCU-CHEK NANO SMARTVIEW) w/Device KIT 1 each by Does not apply route daily. 1 kit 0   JANUVIA 100 MG tablet Take 1 tablet by mouth once daily 90 tablet 0   JARDIANCE 25 MG TABS tablet Take 1 tablet by mouth once daily 90 tablet 0   lisinopril (PRINIVIL,ZESTRIL) 5 MG tablet      metFORMIN (GLUCOPHAGE-XR) 500 MG 24 hr tablet Take 4 tablets (2,000 mg total) by mouth daily with breakfast. 360 tablet 3   pioglitazone (ACTOS) 45 MG tablet Take 1 tablet (45 mg total) by mouth daily. 90 tablet 3   No current facility-administered medications on file prior to visit.    No Known Allergies  Family History  Problem Relation Age of Onset   Diabetes Mother     BP (!) 150/70 (BP Location: Right Arm, Patient Position: Sitting, Cuff Size: Normal)   Pulse (!) 58   Ht 5' 11.5" (1.816 m)   Wt 185 lb 3.2 oz (84 kg)   SpO2 99%  BMI 25.47 kg/m    Review of Systems He denies hypoglycemia.      Objective:   Physical Exam Pulses: dorsalis pedis intact bilat.   MSK: no deformity of the feet CV: no leg edema.   Skin:  no ulcer on the feet.  normal color and temp on the feet.  Neuro: sensation is intact to touch on the feet.   A1c=7.4%  Lab Results  Component Value Date   CREATININE 1.13 02/20/2021   BUN 22 02/20/2021   NA 136 02/20/2021   K 4.2 02/20/2021   CL 102 02/20/2021   CO2 25 02/20/2021      Assessment & Plan:  Type 2 DM: uncontrolled.  Pioglitazone will work more with time CRI: we should minimize glyburide

## 2021-08-30 NOTE — Patient Instructions (Addendum)
Your blood pressure is high today.  Please see your primary care provider soon, to have it rechecked.   check your blood sugar once a day.  vary the time of day when you check, between before the 3 meals, and at bedtime.  also check if you have symptoms of your blood sugar being too high or too low.  please keep a record of the readings and bring it to your next appointment here (or you can bring the meter itself).  You can write it on any piece of paper.  please call us sooner if your blood sugar goes below 70, or if you have a lot of readings over 200.   I have sent a prescription to your pharmacy, to reduce the glyburide.   Please continue the same other medications.   Please come back for a follow-up appointment in January.

## 2021-09-10 ENCOUNTER — Other Ambulatory Visit: Payer: Self-pay | Admitting: Endocrinology

## 2021-10-15 ENCOUNTER — Other Ambulatory Visit: Payer: Self-pay | Admitting: Endocrinology

## 2021-11-12 ENCOUNTER — Other Ambulatory Visit: Payer: Self-pay | Admitting: Endocrinology

## 2021-11-19 DIAGNOSIS — J069 Acute upper respiratory infection, unspecified: Secondary | ICD-10-CM | POA: Diagnosis not present

## 2021-11-19 DIAGNOSIS — R0981 Nasal congestion: Secondary | ICD-10-CM | POA: Diagnosis not present

## 2021-11-19 DIAGNOSIS — R059 Cough, unspecified: Secondary | ICD-10-CM | POA: Diagnosis not present

## 2021-12-17 ENCOUNTER — Other Ambulatory Visit: Payer: Self-pay | Admitting: Endocrinology

## 2022-01-10 ENCOUNTER — Ambulatory Visit: Payer: Medicare Other | Admitting: Endocrinology

## 2022-01-16 ENCOUNTER — Other Ambulatory Visit: Payer: Self-pay | Admitting: Endocrinology

## 2022-02-12 ENCOUNTER — Ambulatory Visit (INDEPENDENT_AMBULATORY_CARE_PROVIDER_SITE_OTHER): Payer: Medicare Other | Admitting: Endocrinology

## 2022-02-12 ENCOUNTER — Other Ambulatory Visit: Payer: Self-pay

## 2022-02-12 VITALS — BP 132/60 | HR 75 | Ht 71.5 in | Wt 186.4 lb

## 2022-02-12 DIAGNOSIS — E1142 Type 2 diabetes mellitus with diabetic polyneuropathy: Secondary | ICD-10-CM | POA: Diagnosis not present

## 2022-02-12 LAB — POCT GLYCOSYLATED HEMOGLOBIN (HGB A1C): Hemoglobin A1C: 8.1 % — AB (ref 4.0–5.6)

## 2022-02-12 MED ORDER — REPAGLINIDE 1 MG PO TABS: 1.0000 mg | ORAL_TABLET | Freq: Two times a day (BID) | ORAL | 3 refills | Status: DC

## 2022-02-12 NOTE — Patient Instructions (Addendum)
check your blood sugar once a day.  vary the time of day when you check, between before the 3 meals, and at bedtime.  also check if you have symptoms of your blood sugar being too high or too low.  please keep a record of the readings and bring it to your next appointment here (or you can bring the meter itself).  You can write it on any piece of paper.  please call us sooner if your blood sugar goes below 70, or if you have a lot of readings over 200.   I have sent a prescription to your pharmacy, to change the glyburide to repaglinide.   Please continue the same other medications.   Please come back for a follow-up appointment in 2-3 months.

## 2022-02-12 NOTE — Progress Notes (Signed)
Subjective:    Patient ID: Nathan Duncan, male    DOB: [birth year], 71 y.o.   MRN: [id number]  HPI Pt returns for f/u of diabetes mellitus:  DM type: 2 (although he may be developing type 1).   Dx'ed: 8882. Complications: PN, DR, and CRI.  Therapy: 5 oral meds.  DKA: never.  Severe hypoglycemia: once, in the 1980's--he does not know what med or meds he was taking then.   Pancreatitis: never.  SDOH: He did not take Rybelsus, due to cost.  Other: he has never been on insulin, be he has learned how to take; he declines to change glyburide to shorter-acting med; he is retired; Pt says if he took insulin, he would be willing to take just qd.   Interval history: pt states he feels better in general, since recent illness.  He takes meds as rx'ed.  He brings a record of his cbg's which I have reviewed today.  cbg varies from 127-170.  Past Medical History:  Diagnosis Date   Diabetes ([place])    Dyslipidemia     No past surgical history on file.  Social History   Socioeconomic History   Marital status: Married    Spouse name: Not on file   Number of children: Not on file   Years of education: Not on file   Highest education level: Not on file  Occupational History   Not on file  Tobacco Use   Smoking status: Former   Smokeless tobacco: Never  Substance and Sexual Activity   Alcohol use: Yes   Drug use: Not on file   Sexual activity: Not on file  Other Topics Concern   Not on file  Social History Narrative   Not on file   Social Determinants of Health   Financial Resource Strain: Not on file  Food Insecurity: Not on file  Transportation Needs: Not on file  Physical Activity: Not on file  Stress: Not on file  Social Connections: Not on file  Intimate Partner Violence: Not on file    Current Outpatient Medications on File Prior to Visit  Medication Sig Dispense Refill   ACCU-CHEK SMARTVIEW test strip USE 1 STRIP TO CHECK GLUCOSE 4 TIMES DAILY 200 each 0   aspirin EC  81 MG tablet Take 81 mg by mouth daily.     atorvastatin (LIPITOR) 40 MG tablet Take 40 mg by mouth daily.      Blood Glucose Monitoring Suppl (ACCU-CHEK NANO SMARTVIEW) w/Device KIT 1 each by Does not apply route daily. 1 kit 0   JANUVIA 100 MG tablet Take 1 tablet by mouth once daily 30 tablet 0   JARDIANCE 25 MG TABS tablet Take 1 tablet by mouth once daily 90 tablet 0   lisinopril (PRINIVIL,ZESTRIL) 5 MG tablet      metFORMIN (GLUCOPHAGE-XR) 500 MG 24 hr tablet Take 4 tablets (2,000 mg total) by mouth daily with breakfast. 360 tablet 3   pioglitazone (ACTOS) 45 MG tablet Take 1 tablet (45 mg total) by mouth daily. 90 tablet 3   No current facility-administered medications on file prior to visit.    No Known Allergies  Family History  Problem Relation Age of Onset   Diabetes Mother     BP 132/60    Pulse 75    Ht 5' 11.5" (1.816 m)    Wt 186 lb 6.4 oz (84.6 kg)    SpO2 95%    BMI 25.64 kg/m    Review of Systems  Objective:   Physical Exam VITAL SIGNS:  See vs page GENERAL: no distress Pulses: dorsalis pedis intact bilat.   MSK: no deformity of the feet CV: no leg edema Skin:  no ulcer on the feet.  normal color and temp on the feet. Neuro: sensation is intact to touch on the feet  A1c=8.1%      Assessment & Plan:  Type 2 DM: uncontrolled  Patient Instructions  check your blood sugar once a day.  vary the time of day when you check, between before the 3 meals, and at bedtime.  also check if you have symptoms of your blood sugar being too high or too low.  please keep a record of the readings and bring it to your next appointment here (or you can bring the meter itself).  You can write it on any piece of paper.  please call us sooner if your blood sugar goes below 70, or if you have a lot of readings over 200.   I have sent a prescription to your pharmacy, to change the glyburide to repaglinide.   Please continue the same other medications.   Please come back for a  follow-up appointment in 2-3 months.

## 2022-02-19 ENCOUNTER — Other Ambulatory Visit: Payer: Self-pay | Admitting: Endocrinology

## 2022-02-19 ENCOUNTER — Encounter: Payer: Self-pay | Admitting: Endocrinology

## 2022-03-04 DIAGNOSIS — H6122 Impacted cerumen, left ear: Secondary | ICD-10-CM | POA: Diagnosis not present

## 2022-03-04 DIAGNOSIS — E1142 Type 2 diabetes mellitus with diabetic polyneuropathy: Secondary | ICD-10-CM | POA: Diagnosis not present

## 2022-03-04 DIAGNOSIS — Z1211 Encounter for screening for malignant neoplasm of colon: Secondary | ICD-10-CM | POA: Diagnosis not present

## 2022-03-04 DIAGNOSIS — Z0000 Encounter for general adult medical examination without abnormal findings: Secondary | ICD-10-CM | POA: Diagnosis not present

## 2022-03-04 DIAGNOSIS — L989 Disorder of the skin and subcutaneous tissue, unspecified: Secondary | ICD-10-CM | POA: Diagnosis not present

## 2022-03-04 DIAGNOSIS — Z86010 Personal history of colonic polyps: Secondary | ICD-10-CM | POA: Diagnosis not present

## 2022-03-04 DIAGNOSIS — I10 Essential (primary) hypertension: Secondary | ICD-10-CM | POA: Diagnosis not present

## 2022-03-04 DIAGNOSIS — E782 Mixed hyperlipidemia: Secondary | ICD-10-CM | POA: Diagnosis not present

## 2022-03-04 DIAGNOSIS — G4733 Obstructive sleep apnea (adult) (pediatric): Secondary | ICD-10-CM | POA: Diagnosis not present

## 2022-03-04 DIAGNOSIS — Z8739 Personal history of other diseases of the musculoskeletal system and connective tissue: Secondary | ICD-10-CM | POA: Diagnosis not present

## 2022-03-18 ENCOUNTER — Other Ambulatory Visit: Payer: Self-pay | Admitting: Endocrinology

## 2022-03-19 DIAGNOSIS — I700 Atherosclerosis of aorta: Secondary | ICD-10-CM | POA: Diagnosis not present

## 2022-03-19 DIAGNOSIS — I10 Essential (primary) hypertension: Secondary | ICD-10-CM | POA: Diagnosis not present

## 2022-03-19 DIAGNOSIS — Z23 Encounter for immunization: Secondary | ICD-10-CM | POA: Diagnosis not present

## 2022-03-19 DIAGNOSIS — H6122 Impacted cerumen, left ear: Secondary | ICD-10-CM | POA: Diagnosis not present

## 2022-03-21 DIAGNOSIS — H52223 Regular astigmatism, bilateral: Secondary | ICD-10-CM | POA: Diagnosis not present

## 2022-03-21 DIAGNOSIS — H02834 Dermatochalasis of left upper eyelid: Secondary | ICD-10-CM | POA: Diagnosis not present

## 2022-03-21 DIAGNOSIS — H5213 Myopia, bilateral: Secondary | ICD-10-CM | POA: Diagnosis not present

## 2022-03-21 DIAGNOSIS — E113293 Type 2 diabetes mellitus with mild nonproliferative diabetic retinopathy without macular edema, bilateral: Secondary | ICD-10-CM | POA: Diagnosis not present

## 2022-03-21 DIAGNOSIS — H02831 Dermatochalasis of right upper eyelid: Secondary | ICD-10-CM | POA: Diagnosis not present

## 2022-03-21 DIAGNOSIS — H524 Presbyopia: Secondary | ICD-10-CM | POA: Diagnosis not present

## 2022-03-21 DIAGNOSIS — H25813 Combined forms of age-related cataract, bilateral: Secondary | ICD-10-CM | POA: Diagnosis not present

## 2022-04-15 ENCOUNTER — Other Ambulatory Visit: Payer: Self-pay | Admitting: Endocrinology

## 2022-04-26 DIAGNOSIS — R208 Other disturbances of skin sensation: Secondary | ICD-10-CM | POA: Diagnosis not present

## 2022-04-26 DIAGNOSIS — D1801 Hemangioma of skin and subcutaneous tissue: Secondary | ICD-10-CM | POA: Diagnosis not present

## 2022-04-26 DIAGNOSIS — L568 Other specified acute skin changes due to ultraviolet radiation: Secondary | ICD-10-CM | POA: Diagnosis not present

## 2022-04-26 DIAGNOSIS — L814 Other melanin hyperpigmentation: Secondary | ICD-10-CM | POA: Diagnosis not present

## 2022-04-26 DIAGNOSIS — L989 Disorder of the skin and subcutaneous tissue, unspecified: Secondary | ICD-10-CM | POA: Diagnosis not present

## 2022-04-26 DIAGNOSIS — L821 Other seborrheic keratosis: Secondary | ICD-10-CM | POA: Diagnosis not present

## 2022-04-26 DIAGNOSIS — L820 Inflamed seborrheic keratosis: Secondary | ICD-10-CM | POA: Diagnosis not present

## 2022-04-26 DIAGNOSIS — D485 Neoplasm of uncertain behavior of skin: Secondary | ICD-10-CM | POA: Diagnosis not present

## 2022-04-26 DIAGNOSIS — Z789 Other specified health status: Secondary | ICD-10-CM | POA: Diagnosis not present

## 2022-05-13 ENCOUNTER — Encounter: Payer: Self-pay | Admitting: Skilled Nursing Facility1

## 2022-05-13 ENCOUNTER — Ambulatory Visit: Payer: Medicare Other | Admitting: Endocrinology

## 2022-05-13 ENCOUNTER — Other Ambulatory Visit: Payer: Self-pay | Admitting: Endocrinology

## 2022-05-13 ENCOUNTER — Encounter: Payer: Medicare Other | Attending: Endocrinology | Admitting: Skilled Nursing Facility1

## 2022-05-13 DIAGNOSIS — Z713 Dietary counseling and surveillance: Secondary | ICD-10-CM | POA: Diagnosis not present

## 2022-05-13 DIAGNOSIS — Z6825 Body mass index (BMI) 25.0-25.9, adult: Secondary | ICD-10-CM | POA: Diagnosis not present

## 2022-05-13 DIAGNOSIS — E1142 Type 2 diabetes mellitus with diabetic polyneuropathy: Secondary | ICD-10-CM | POA: Diagnosis not present

## 2022-05-13 NOTE — Progress Notes (Signed)
Pt states he got his A1C down to 7.2 one time with the last reading being 8.2.  DM medications: Repaglinide Pioglitozone Jardiance Metformin   Pt arrives with his wife who is supportive. Pts wife states the pt has Gout.  Pt states overly restrictive dietary changes are not going to be an option for one giving the example of only eating bologna once a week would be too difficult.  Pt states his endocrinologist has advised pt start insulin for a few years now stating he is afraid of insulin: Dietitian explained the mechanism of action with insulin along with the benefits and showed pt the needle. Pt states check his blood sugars every morning. Pt states he really wants to try some of the changes discussed in todays visit before his next endocrinologist visit to see if he could get off some his current medications and not need insulin.   Pt states his endocrinologist will not be able to read his monitor because it is malfunction: Dietitian advised pt to go ahead and get a an updated meter.   Pt states he does struggle with motivation to walk or make changes with his diet but he does recognize if he wants tighter blood sugar control there are things he would need to change.    Wakes around 5:30am-8am. Has sleep apnea and snores stating he felt he was choking one night with his C-PAP so he stopped wearing it: After some conversation on this topic pt is willing to talk to his PCP about another option for his sleep apnea.    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  Pt states he Loves chicken snitzel.    Numbers: fasting: 137, 154, 146, 135, 139, 170, 151, 165, 171, 160, 186, 201  Pt states his Best bedtime snack is cottage cheese and mandarin oranges.   Diabetes Self-Management Education  Visit Type: Follow-up   05/13/2022  Mr. Nathan Duncan, identified by name and date of birth, is a 71 y.o.  male with a diagnosis of Diabetes:  .   ASSESSMENT  Height '5\' 11"'$  (1.803 m), weight 186 lb 4.8 oz (84.5 kg). Body mass index is 25.98 kg/m.   Diabetes Self-Management Education - 05/13/22 1029       Visit Information   Visit Type Follow-up      Health Coping   How would you rate your overall health? Good      Psychosocial Assessment   Patient Belief/Attitude about Diabetes Denial    What is the hardest part about your diabetes right now, causing you the most concern, or is the most worrisome to you about your diabetes?   Making healty food and beverage choices;Being active    Self-care barriers None    Self-management support Family;Friends    Other persons present Spouse/SO    Patient Concerns Nutrition/Meal planning;Glycemic Control    Special Needs None    Preferred Learning Style Auditory    Learning Readiness Contemplating    How often do you need to have someone help you when you read instructions, pamphlets, or other written materials from your doctor or pharmacy? 1 - Never    What is the last grade level you completed in school? college      Pre-Education Assessment   Patient understands the diabetes disease and treatment process. Needs Review    Patient understands incorporating nutritional management into lifestyle. Needs Review  Patient undertands incorporating physical activity into lifestyle. Needs Review    Patient understands using medications safely. Needs Review    Patient understands monitoring blood glucose, interpreting and using results Needs Review    Patient understands prevention, detection, and treatment of acute complications. Needs Review    Patient understands prevention, detection, and treatment of chronic complications. Needs Review    Patient understands how to develop strategies to address psychosocial issues. Needs Review    Patient understands how to develop strategies to promote health/change behavior. Needs Review      Complications    Last HgB A1C per patient/outside source 8.2 %    How often do you check your blood sugar? 1-2 times/day    Fasting Blood glucose range (mg/dL) 130-179;180-200    Number of hypoglycemic episodes per month 0    Number of hyperglycemic episodes ( >'200mg'$ /dL): Weekly    Can you tell when your blood sugar is high? Yes    Have you had a dilated eye exam in the past 12 months? Yes    Have you had a dental exam in the past 12 months? Yes    Are you checking your feet? Yes    How many days per week are you checking your feet? 7      Dietary Intake   Breakfast 11am doughnut or cereal or biscuit    Lunch white bread bologna sandwich and milk    Dinner chicken + 1/4 cup rice + 1/2 broccoli    Beverage(s) 3-5 cups coffee + fake sugar + half and half      Activity / Exercise   Activity / Exercise Type ADL's    How many days per week do you exercise? 0    How many minutes per day do you exercise? 0    Total minutes per week of exercise 0      Patient Education   Previous Diabetes Education Yes (please comment)    Disease Pathophysiology Definition of diabetes, type 1 and 2, and the diagnosis of diabetes;Factors that contribute to the development of diabetes    Healthy Eating Role of diet in the treatment of diabetes and the relationship between the three main macronutrients and blood glucose level;Plate Method;Reviewed blood glucose goals for pre and post meals and how to evaluate the patients' food intake on their blood glucose level.;Meal options for control of blood glucose level and chronic complications.    Being Active Role of exercise on diabetes management, blood pressure control and cardiac health.;Helped patient identify appropriate exercises in relation to his/her diabetes, diabetes complications and other health issue.    Medications Taught/reviewed insulin/injectables, injection, site rotation, insulin/injectables storage and needle disposal.;Reviewed patients medication for diabetes,  action, purpose, timing of dose and side effects.    Monitoring Taught/evaluated SMBG meter.;Purpose and frequency of SMBG.;Taught/discussed recording of test results and interpretation of SMBG.;Interpreting lab values - A1C, lipid, urine microalbumina.    Diabetes Stress and Support Identified and addressed patients feelings and concerns about diabetes;Worked with patient to identify barriers to care and solutions;Role of stress on diabetes;Brainstormed with patient on coping mechanisms for social situations, getting support from significant others, dealing with feelings about diabetes    Lifestyle and Health Coping Lifestyle issues that need to be addressed for better diabetes care      Individualized Goals (developed by patient)   Nutrition Follow meal plan discussed;General guidelines for healthy choices and portions discussed    Physical Activity Exercise 5-7 days per week;15 minutes per  day;30 minutes per day    Medications take my medication as prescribed    Problem Solving Sleep Pattern;Eating Pattern;Addressing barriers to behavior change    Reducing Risk examine blood glucose patterns      Patient Self-Evaluation of Goals - Patient rates self as meeting previously set goals (% of time)   Nutrition 50 - 75 % (half of the time)    Physical Activity < 25% (hardly ever/never)    Medications >75% (most of the time)    Monitoring >75% (most of the time)    Problem Solving and behavior change strategies  50 - 75 % (half of the time)    Reducing Risk (treating acute and chronic complications) >02% (most of the time)    Health Coping 50 - 75 % (half of the time)      Post-Education Assessment   Patient understands the diabetes disease and treatment process. Demonstrates understanding / competency    Patient understands incorporating nutritional management into lifestyle. Demonstrates understanding / competency    Patient undertands incorporating physical activity into lifestyle. Demonstrates  understanding / competency    Patient understands using medications safely. Demonstrates understanding / competency    Patient understands monitoring blood glucose, interpreting and using results Demonstrates understanding / competency    Patient understands prevention, detection, and treatment of acute complications. Demonstrates understanding / competency    Patient understands prevention, detection, and treatment of chronic complications. Demonstrates understanding / competency    Patient understands how to develop strategies to address psychosocial issues. Demonstrates understanding / competency    Patient understands how to develop strategies to promote health/change behavior. Demonstrates understanding / competency      Outcomes   Expected Outcomes Demonstrated interest in learning. Expect positive outcomes    Future DMSE 2 months    Program Status Completed      Subsequent Visit   Since your last visit have you continued or begun to take your medications as prescribed? Yes    Since your last visit have you had your blood pressure checked? No    Since your last visit have you experienced any weight changes? No change    Since your last visit, are you checking your blood glucose at least once a day? Yes             Individualized Plan for Diabetes Self-Management Training:   Learning Objective:  Patient will have a greater understanding of diabetes self-management. Patient education plan is to attend individual and/or group sessions per assessed needs and concerns.   Plan:   -incorporate at least 1 full cup non starchy vegetables 2 times a day 7 days a week -choose complex carbohydrates  -increase your physical activity daily through walking or the Wii fit -check your blood sugar at different times of day including 2 hours after meals throughout the week seeking patterns   Expected Outcomes:  Demonstrated interest in learning. Expect positive outcomes  Education material  provided: ADA - How to Thrive: A Guide for Your Journey with Diabetes, Meal plan card, My Plate, Snack sheet, and Carbohydrate counting sheet  If problems or questions, patient to contact team via:  Phone and Email  Future DSME appointment: 2 months

## 2022-06-13 ENCOUNTER — Other Ambulatory Visit: Payer: Self-pay | Admitting: Endocrinology

## 2022-06-17 ENCOUNTER — Other Ambulatory Visit: Payer: Self-pay

## 2022-06-17 MED ORDER — METFORMIN HCL ER 500 MG PO TB24: ORAL_TABLET | ORAL | 0 refills | Status: DC

## 2022-06-26 DIAGNOSIS — Z23 Encounter for immunization: Secondary | ICD-10-CM | POA: Diagnosis not present

## 2022-06-27 ENCOUNTER — Encounter: Payer: Self-pay | Admitting: Endocrinology

## 2022-06-27 ENCOUNTER — Other Ambulatory Visit (HOSPITAL_COMMUNITY): Payer: Self-pay

## 2022-06-27 ENCOUNTER — Telehealth: Payer: Self-pay | Admitting: Endocrinology

## 2022-06-27 ENCOUNTER — Ambulatory Visit (INDEPENDENT_AMBULATORY_CARE_PROVIDER_SITE_OTHER): Payer: Medicare Other | Admitting: Endocrinology

## 2022-06-27 VITALS — BP 142/64 | HR 67 | Ht 71.0 in | Wt 190.0 lb

## 2022-06-27 DIAGNOSIS — E1142 Type 2 diabetes mellitus with diabetic polyneuropathy: Secondary | ICD-10-CM

## 2022-06-27 DIAGNOSIS — E1165 Type 2 diabetes mellitus with hyperglycemia: Secondary | ICD-10-CM

## 2022-06-27 LAB — URINALYSIS, ROUTINE W REFLEX MICROSCOPIC
Bilirubin Urine: NEGATIVE
Hgb urine dipstick: NEGATIVE
Ketones, ur: NEGATIVE
Leukocytes,Ua: NEGATIVE
Nitrite: NEGATIVE
RBC / HPF: NONE SEEN (ref 0–?)
Specific Gravity, Urine: 1.01 (ref 1.000–1.030)
Total Protein, Urine: NEGATIVE
Urine Glucose: >=1000 — AB
Urobilinogen, UA: 0.2 (ref 0.0–1.0)
pH: 6 (ref 5.0–8.0)

## 2022-06-27 LAB — POCT GLYCOSYLATED HEMOGLOBIN (HGB A1C): Hemoglobin A1C: 8.7 % — AB (ref 4.0–5.6)

## 2022-06-27 LAB — BASIC METABOLIC PANEL
BUN: 23 mg/dL (ref 6–23)
CO2: 25 mEq/L (ref 19–32)
Calcium: 9.5 mg/dL (ref 8.4–10.5)
Chloride: 101 mEq/L (ref 96–112)
Creatinine, Ser: 1.19 mg/dL (ref 0.40–1.50)
GFR: 60.62 mL/min (ref >60.00)
Glucose, Bld: 189 mg/dL — ABNORMAL HIGH (ref 70–99)
Potassium: 4.5 mEq/L (ref 3.5–5.1)
Sodium: 135 mEq/L (ref 135–145)

## 2022-06-27 LAB — MICROALBUMIN / CREATININE URINE RATIO
Creatinine,U: 60.7 mg/dL
Microalb Creat Ratio: 4.4 mg/g (ref 0.0–30.0)
Microalb, Ur: 2.6 mg/dL — ABNORMAL HIGH (ref 0.0–1.9)

## 2022-06-27 MED ORDER — GABAPENTIN 100 MG PO CAPS: 100.0000 mg | ORAL_CAPSULE | Freq: Every day | ORAL | 3 refills | Status: DC

## 2022-06-27 MED ORDER — GLIMEPIRIDE 4 MG PO TABS: 4.0000 mg | ORAL_TABLET | Freq: Every day | ORAL | 3 refills | Status: DC

## 2022-06-27 NOTE — Telephone Encounter (Signed)
Please check to see if Trulicity, Mounjaro or Rybelsus are covered by his insurance and respective co-pays, thanks

## 2022-06-27 NOTE — Patient Instructions (Addendum)
Check blood sugars on waking up 3-4  days a week  Also check blood sugars about 2 hours after meals and do this after different meals by rotation  Recommended blood sugar levels on waking up are 90-130 and about 2 hours after meal is 130-160  Please bring your blood sugar monitor to each visit, thank you  Stop repaglinide and pioglitazone  Start GLIMEPIRIDE at dinnertime, for the first week take half tablet of the 4 mg dose and if your morning sugars are still over 150 increase it to the whole tablet  We will be checking on the insurance coverage for either Trulicity or Mounjaro  If this is available this will replace JANUVIA with the following instructions  Start injecting with the autoinjector device  as shown once weekly on the same day of the week. The first 4 injections will be the smaller dose and then the dose will be increased on the fifth week  You may inject in the stomach, thigh or arm as indicated in the brochure given.  You will feel fullness of the stomach with starting the medication and should try to keep the portions at meals small.  You may experience nausea in the first few days which usually gets better over time   If any questions or concerns are present call the office or the  [facility] at [phone].   Gabapentin will be taken as needed for nerve pain during the night

## 2022-06-27 NOTE — Progress Notes (Signed)
Patient ID: Nathan Duncan, male   DOB: [birth year], 71 y.o.   MRN: [id number]           Reason for Appointment: Type II Diabetes follow-up   History of Present Illness   Diagnosis date: 1985  Previous history:  Non-insulin hypoglycemic drugs previously used: Metformin, Januvia, Amaryl, Invokana, Rybelsus Actos was started in June 2022 He has never been on insulin  A1c range in the last few years is: 7.4-8.9  Recent history:     Non-insulin hypoglycemic drugs: Actos, metformin, Jardiance, Prandin 1 mg, Januvia He stopped Rybelsus in 2022 March after taking it for almost a year           Side effects from medications: None  Current self management, blood sugar patterns and problems identified:  A1c is most recently 8.7 He checks blood sugars mostly in the morning with the generic monitor and these are fairly consistently high, has not checked any readings after meals He says that he has seen the dietitian in 5/23 and is starting to make changes with his diet, he was advised to increase his vegetables, reduce simple sugars and high fat meats and cold cuts  Also just starting to do some walking No recent labs available He has had no side effects with any of his medications  Hypoglycemia:  none    Glucometer: One Touch.           Blood Glucose readings from recall:    PRE-MEAL Fasting Lunch Dinner Bedtime Overall  Glucose range: 164-180      Mean/median:     171   Dietician visit: Most recent: 05/13/22     Weight control:  Wt Readings from Last 3 Encounters:  06/27/22 190 lb (86.2 kg)  05/13/22 186 lb 4.8 oz (84.5 kg)  02/12/22 186 lb 6.4 oz (84.6 kg)            Diabetes labs:  Lab Results  Component Value Date   HGBA1C 8.7 (A) 06/27/2022   HGBA1C 8.1 (A) 02/12/2022   HGBA1C 7.4 (A) 08/30/2021   Lab Results  Component Value Date   CREATININE 1.13 02/20/2021     Allergies as of 06/27/2022   No Known Allergies      Medication List         Accurate as of June 27, 2022 10:42 AM. If you have any questions, ask your nurse or doctor.          STOP taking these medications    pioglitazone 45 MG tablet Commonly known as: ACTOS Stopped by: Elayne Snare, MD   repaglinide 1 MG tablet Commonly known as: PRANDIN Stopped by: Elayne Snare, MD       TAKE these medications    Accu-Chek Nano SmartView w/Device Kit 1 each by Does not apply route daily.   Accu-Chek SmartView test strip Generic drug: glucose blood USE 1 STRIP TO CHECK GLUCOSE 4 TIMES DAILY   aspirin EC 81 MG tablet Take 81 mg by mouth daily.   atorvastatin 40 MG tablet Commonly known as: LIPITOR Take 40 mg by mouth daily.   gabapentin 100 MG capsule Commonly known as: NEURONTIN Take 1 capsule (100 mg total) by mouth at bedtime. Started by: Elayne Snare, MD   glimepiride 4 MG tablet Commonly known as: AMARYL Take 1 tablet (4 mg total) by mouth daily before supper. Started by: Elayne Snare, MD   Januvia 100 MG tablet Generic drug: sitaGLIPtin Take 1 tablet by mouth once daily   Jardiance  25 MG Tabs tablet Generic drug: empagliflozin Take 1 tablet by mouth once daily   lisinopril 5 MG tablet Commonly known as: ZESTRIL   metFORMIN 500 MG 24 hr tablet Commonly known as: GLUCOPHAGE-XR TAKE 4 TABLETS BY MOUTH ONCE DAILY WITH BREAKFAST        Allergies: No Known Allergies  Past Medical History:  Diagnosis Date   Diabetes ([place])    Dyslipidemia     History reviewed. No pertinent surgical history.  Family History  Problem Relation Age of Onset   Diabetes Mother     Social History:  reports that he has quit smoking. He has never used smokeless tobacco. He reports current alcohol use. No history on file for drug use.  Review of Systems:  Last diabetic eye exam date 3/23  Last foot exam date: 9/22  Symptoms of neuropathy: pins and needles in his feet and different locations, sometimes affecting his sleep, and not taking any medications for  this Has not had any urine microalbumin tested  Hypertension:   Treatment includes lisinopril 5 mg daily  BP Readings from Last 3 Encounters:  06/27/22 (!) 142/64  02/12/22 132/60  08/30/21 (!) 150/70    Lipid management: His last LDL is 93 This is being followed by his PCP and treated with atorvastatin 40 mg daily    No results found for: "CHOL" No results found for: "HDL" No results found for: "LDLCALC" No results found for: "TRIG" No results found for: "CHOLHDL" No results found for: "LDLDIRECT"  No results found for: "ALT"   Examination:   BP (!) 142/64   Pulse 67   Ht 5' 11"  (1.803 m)   Wt 190 lb (86.2 kg)   SpO2 99%   BMI 26.50 kg/m   Body mass index is 26.5 kg/m.   Inspection of his feet is normal and monofilament sensation is mostly normal No pedal edema  ASSESSMENT/ PLAN:    Diabetes type 2:   Current regimen: Actos, metformin, Prandin, Januvia and Jardiance  See history of present illness for detailed discussion of current diabetes management, blood sugar patterns and problems identified  A1c is 8.7 and higher than before  Blood glucose control is suboptimal although currently being monitored at home only fasting with blood sugars averaging about 170 for the last month He is on polypharmacy with 5 drugs for the diabetes but not getting adequate control likely indicating some progression of his diabetes and need for more active agents or potentially insulin He is reluctant to consider injectable drugs  Recommendations:  Since he has relatively high fasting readings he likely needs some more effective medications than repaglinide and Januvia For now we will start him on Amaryl instead of 1 mg repaglinide and use 2 mg at dinnertime for the first week and then if fasting readings are still high over 150 go up to 4 mg Also since his A1c did not improve with adding Actos last year this is likely not benefiting and can be stopped We will check to see if  Trulicity, Mounjaro or Rybelsus are affordable for him and will message the pharmacy PA team to investigate this, if available he will then stop JANUVIA He is a good candidate for a GLP-1 drug especially with his higher blood sugars recently had reluctance to take insulin Discussed with the patient the nature of GLP-1 drugs, the action on various organ systems, improved satiety, how they benefit blood glucose control, as well as the benefit of weight loss. Explained possible side  effects of TRULICITY, most commonly nausea that usually improves over time; discussed safety information in package insert.  Demonstrated the medication injection device and injection technique to the patient.  Showed patient possible injection sites We will start with the smallest dose and increase after 4 weeks Patient brochure on Trulicity with enclosed co-pay card given Start checking blood sugars consistently about 2 hours after meals alternating with fasting readings  For his NEUROPATHY like symptoms he can take gabapentin 100 mg as needed at night as a trial   Patient Instructions  Check blood sugars on waking up 3-4  days a week  Also check blood sugars about 2 hours after meals and do this after different meals by rotation  Recommended blood sugar levels on waking up are 90-130 and about 2 hours after meal is 130-160  Please bring your blood sugar monitor to each visit, thank you  Stop repaglinide and pioglitazone  Start GLIMEPIRIDE at dinnertime, for the first week take half tablet of the 4 mg dose and if your morning sugars are still over 150 increase it to the whole tablet  We will be checking on the insurance coverage for either Trulicity or Mounjaro  If this is available this will replace JANUVIA with the following instructions  Start injecting with the autoinjector device  as shown once weekly on the same day of the week. The first 4 injections will be the smaller dose and then the dose will be  increased on the fifth week  You may inject in the stomach, thigh or arm as indicated in the brochure given.  You will feel fullness of the stomach with starting the medication and should try to keep the portions at meals small.  You may experience nausea in the first few days which usually gets better over time   If any questions or concerns are present call the office or the  [facility] at [phone].   Gabapentin will be taken as needed for nerve pain during the night     Elayne Snare 06/27/2022, 10:42 AM   Addendum: Apparently Rybelsus will be $25 per month for 90-day supply and will recommend that he start with 3 mg sample and then 7 mg daily

## 2022-07-01 ENCOUNTER — Encounter: Payer: Self-pay | Admitting: Endocrinology

## 2022-07-01 MED ORDER — RYBELSUS 7 MG PO TABS: 7.0000 mg | ORAL_TABLET | Freq: Every day | ORAL | 0 refills | Status: DC

## 2022-07-01 NOTE — Telephone Encounter (Signed)
Called and spoke with patient this morning. Per Dr Dwyane Dee we will try rybelsus. As it is a cheaper medication for patient.

## 2022-07-02 ENCOUNTER — Ambulatory Visit: Payer: Medicare Other | Admitting: Skilled Nursing Facility1

## 2022-07-02 NOTE — Telephone Encounter (Signed)
Patient picked up Rybelsus sample

## 2022-07-15 ENCOUNTER — Other Ambulatory Visit: Payer: Self-pay | Admitting: Endocrinology

## 2022-07-22 ENCOUNTER — Encounter: Payer: Self-pay | Admitting: Endocrinology

## 2022-07-24 ENCOUNTER — Encounter: Payer: Medicare Other | Attending: Endocrinology | Admitting: Skilled Nursing Facility1

## 2022-07-24 DIAGNOSIS — E1142 Type 2 diabetes mellitus with diabetic polyneuropathy: Secondary | ICD-10-CM | POA: Diagnosis not present

## 2022-07-24 NOTE — Progress Notes (Signed)
Pt arrives having made some great changes such as taking his blood sugar readings and trying to walk more.   Pt states he got a new meter to test his blood sugars ([employer]). Pt states since testing his blood sugars he also started logging his foods. Pt states he really has not started changing his eating. Pt states he started walking more but not consistently.  Pt states he does not sleep well at night.  Pt states he is really happy with his Endo.  Pt states he has discussed eating more vegetables but not really making that change but has increase some carrots and green beans.  Pt states he has a lot of feet trouble. Pt states since not feeling so upset about things he has gotten back into his hobbies.   Pt states his main concern is quality of life and being happy with his food choices so making changes to his diet is not something he is overwhelmingly interested in because he is  big fan of biscuits and such.   Blood sugars: Started testing again July 8th Post prandial: 227, 176, 199, 166 Fasting: 132, 146, 130 Preprandial 227, 157, 125  Started on Rybelsus, no longer taking actos and repaglinide   24 hr recall: Breakfast: egg + corn beef hash + biscuit and gravy or oatmeal + craisons  Snack:  Lunch: bologna sandwich + pretzels  Snack: Dinner: steak sandwich  Snack:apple turnover    Beverages: coffee, milk, water  Nutrition education: Purpose of hydration: Water makes up over 50% of your total body water, and is part of many organs throughout the body. Water is essential to transport digested nutrients, regulate body temperature, rid the body of waste products, and protects joints and the spinal cord. When not properly hydrated you will begin to experience headaches, cramps and dizziness. Further dehydration can result in rapid heart rate, shock, oliguria, and may cause seizures.   https://www.merckmanuals.com/home/hormonal-and-metabolic-disordehttps://www.usgs.gov/special-topic/water-science-school/science/water-you-water-and-human-body?qt-science_center_objects=0#qt-science_center_objectsrs/water-balance/about-body-water HistoricalGrowth.gl https://www.stevens.org/ PimpTShirt.fi https://www.health.InvestmentBrowse.at Educated pt on saturated fat and the foods high in nutrient as well as its connection with heart disease and blood pressure Importance of vegetables To have an overall healthy diet, adult men and women are recommended to consume anywhere from 2-3 cups of vegetables daily. Vegetables provide a wide range of vitamins and minerals such as vitamin A, vitamin C, potassium, and folic acid. According to the [facility], including fruit and vegetables daily may reduce the risk of cardiovascular disease, certain cancers, and other non-communicable diseases.   Goals: Have a cup of water after drinking coffee Include more non starchy vegetables Reduce our fatty saturated fat meats Increase your walks to 3 days a week

## 2022-08-03 ENCOUNTER — Other Ambulatory Visit: Payer: Self-pay | Admitting: Endocrinology

## 2022-08-12 ENCOUNTER — Other Ambulatory Visit: Payer: Self-pay | Admitting: Endocrinology

## 2022-08-21 ENCOUNTER — Other Ambulatory Visit (INDEPENDENT_AMBULATORY_CARE_PROVIDER_SITE_OTHER): Payer: Medicare Other

## 2022-08-21 DIAGNOSIS — E1165 Type 2 diabetes mellitus with hyperglycemia: Secondary | ICD-10-CM | POA: Diagnosis not present

## 2022-08-21 LAB — BASIC METABOLIC PANEL
BUN: 22 mg/dL (ref 6–23)
CO2: 27 mEq/L (ref 19–32)
Calcium: 9.9 mg/dL (ref 8.4–10.5)
Chloride: 102 mEq/L (ref 96–112)
Creatinine, Ser: 1.09 mg/dL (ref 0.40–1.50)
GFR: 67.28 mL/min (ref >60.00)
Glucose, Bld: 157 mg/dL — ABNORMAL HIGH (ref 70–99)
Potassium: 4.7 mEq/L (ref 3.5–5.1)
Sodium: 138 mEq/L (ref 135–145)

## 2022-08-21 LAB — HEMOGLOBIN A1C: Hgb A1c MFr Bld: 8.7 % — ABNORMAL HIGH (ref 4.6–6.5)

## 2022-08-28 ENCOUNTER — Ambulatory Visit: Payer: Medicare Other | Admitting: Endocrinology

## 2022-08-30 ENCOUNTER — Other Ambulatory Visit: Payer: Self-pay | Admitting: Family Medicine

## 2022-08-30 DIAGNOSIS — M25519 Pain in unspecified shoulder: Secondary | ICD-10-CM

## 2022-08-30 DIAGNOSIS — J302 Other seasonal allergic rhinitis: Secondary | ICD-10-CM | POA: Diagnosis not present

## 2022-09-02 ENCOUNTER — Ambulatory Visit (HOSPITAL_BASED_OUTPATIENT_CLINIC_OR_DEPARTMENT_OTHER)
Admission: RE | Admit: 2022-09-02 | Discharge: 2022-09-02 | Disposition: A | Discharge status: Home / Self Care | Payer: Medicare Other | Source: Ambulatory Visit | Attending: Family Medicine | Admitting: Family Medicine

## 2022-09-02 DIAGNOSIS — M25519 Pain in unspecified shoulder: Secondary | ICD-10-CM | POA: Insufficient documentation

## 2022-09-02 DIAGNOSIS — M25712 Osteophyte, left shoulder: Secondary | ICD-10-CM | POA: Diagnosis not present

## 2022-09-09 DIAGNOSIS — M25512 Pain in left shoulder: Secondary | ICD-10-CM | POA: Diagnosis not present

## 2022-09-09 DIAGNOSIS — M6281 Muscle weakness (generalized): Secondary | ICD-10-CM | POA: Diagnosis not present

## 2022-09-10 ENCOUNTER — Encounter: Payer: Self-pay | Admitting: Endocrinology

## 2022-09-10 ENCOUNTER — Other Ambulatory Visit: Payer: Self-pay | Admitting: Endocrinology

## 2022-09-10 ENCOUNTER — Ambulatory Visit (INDEPENDENT_AMBULATORY_CARE_PROVIDER_SITE_OTHER): Payer: Medicare Other | Admitting: Endocrinology

## 2022-09-10 VITALS — BP 148/70 | HR 97 | Ht 71.0 in | Wt 178.6 lb

## 2022-09-10 DIAGNOSIS — E1165 Type 2 diabetes mellitus with hyperglycemia: Secondary | ICD-10-CM | POA: Diagnosis not present

## 2022-09-10 MED ORDER — METFORMIN HCL ER 500 MG PO TB24
ORAL_TABLET | ORAL | 1 refills | Status: DC
Start: 2022-09-10 — End: 2023-03-11

## 2022-09-10 NOTE — Patient Instructions (Addendum)
Take 14 mg Rybelsus with water 30 min before 1st meal of day  Check blood sugars on waking up 2-3 days a week  Also check blood sugars about 2 hours after meals and do this after different meals by rotation  Recommended blood sugar levels on waking up are 90-130 and about 2 hours after meal is 130-160  Please bring your blood sugar monitor to each visit, thank you

## 2022-09-10 NOTE — Progress Notes (Signed)
Patient ID: Nathan Duncan, male   DOB: 20-Mar-1951, 71 y.o.   MRN: [id number]           Reason for Appointment: Type II Diabetes follow-up   History of Present Illness   Diagnosis date: 1985  Previous history:  Non-insulin hypoglycemic drugs previously used: Metformin, Januvia, Amaryl, Invokana, Rybelsus Actos was started in June 2022 He has never been on insulin  A1c range in the last few years is: 7.4-8.9  Recent history:     Non-insulin hypoglycemic drugs: metformin, Jardiance,  Januvia, Rybelsus 7 mg           Side effects from medications: None  Current self management, blood sugar patterns and problems identified:  A1c is again 8.7 He did start Rybelsus and this was apparently more affordable than Trulicity He took 3 mg for 1 month has been taking the 7 mg for just over a month now Now however he is taking the medication and drinking coffee at the same time he did take it half an hour before breakfast or if he does not have breakfast he will take it before lunch No side effects from this He did not stop his Januvia but only ran out a few days ago However despite instructions he is only checking his blood sugars in the mornings and only rarely in the afternoon or evening Only occasionally his blood sugars are in the low 100 range at his morning check which is usually between 5-7 AM  He is only now starting to do a little walking otherwise not much consistent exercise since last visit He is also despite talking to the dietitian not watching his diet and is eating donuts for breakfast He may be watching his portions better since he has lost 5 pounds recently and overall 12 pounds since his last visit Lab blood sugar was 157 fasting  Hypoglycemia:  none    Glucometer: [employer]  Blood Glucose readings from download:  30-day average 138 with recent range 114-177, nonfasting 151  Previously  PRE-MEAL Fasting Lunch Dinner Bedtime Overall  Glucose range: 164-180       Mean/median:     171   Dietician visit: Most recent: 05/13/22     Weight control:  Wt Readings from Last 3 Encounters:  09/10/22 178 lb 9.6 oz (81 kg)  07/24/22 183 lb (83 kg)  06/27/22 190 lb (86.2 kg)            Diabetes labs:  Lab Results  Component Value Date   HGBA1C 8.7 (H) 08/21/2022   HGBA1C 8.7 (A) 06/27/2022   HGBA1C 8.1 (A) 02/12/2022   Lab Results  Component Value Date   MICROALBUR 2.6 (H) 06/27/2022   CREATININE 1.09 08/21/2022     Allergies as of 09/10/2022   No Known Allergies      Medication List        Accurate as of September 10, 2022  4:39 PM. If you have any questions, ask your nurse or doctor.          STOP taking these medications    Januvia 100 MG tablet Generic drug: sitaGLIPtin Stopped by: Elayne Snare, MD       TAKE these medications    Accu-Chek Nano SmartView w/Device Kit 1 each by Does not apply route daily.   Accu-Chek SmartView test strip Generic drug: glucose blood USE 1 STRIP TO CHECK GLUCOSE 4 TIMES DAILY   aspirin EC 81 MG tablet Take 81 mg by mouth daily.  atorvastatin 40 MG tablet Commonly known as: LIPITOR Take 40 mg by mouth daily.   gabapentin 100 MG capsule Commonly known as: NEURONTIN Take 1 capsule (100 mg total) by mouth at bedtime.   glimepiride 4 MG tablet Commonly known as: AMARYL Take 1 tablet (4 mg total) by mouth daily before supper.   Jardiance 25 MG Tabs tablet Generic drug: empagliflozin Take 1 tablet by mouth once daily   lisinopril 5 MG tablet Commonly known as: ZESTRIL   metFORMIN 500 MG 24 hr tablet Commonly known as: GLUCOPHAGE-XR TAKE 4 TABLETS BY MOUTH ONCE DAILY WITH BREAKFAST   Rybelsus 7 MG Tabs Generic drug: Semaglutide Take 7 mg by mouth daily.        Allergies: No Known Allergies  Past Medical History:  Diagnosis Date   Diabetes ([place])    Dyslipidemia     No past surgical history on file.  Family History  Problem Relation Age of Onset   Diabetes  Mother     Social History:  reports that he has quit smoking. He has never used smokeless tobacco. He reports current alcohol use. No history on file for drug use.  Review of Systems:  Last diabetic eye exam date 3/23  Last foot exam date: 9/22  Symptoms of neuropathy: pins and needles in his feet and different locations, sometimes affecting his sleep He was prescribed gabapentin initially but he says he has no symptoms and is not taking this  Last urine microalbumin: 7/23  Hypertension:   Treatment includes lisinopril 5 mg daily  BP Readings from Last 3 Encounters:  09/10/22 (!) 148/70  06/27/22 (!) 142/64  02/12/22 132/60    Lipid management: His last LDL is 93 This is being followed by his PCP and treated with atorvastatin 40 mg daily    No results found for: "CHOL" No results found for: "HDL" No results found for: "LDLCALC" No results found for: "TRIG" No results found for: "CHOLHDL" No results found for: "LDLDIRECT"  No results found for: "ALT"   Examination:   BP (!) 148/70   Pulse 97   Ht _0  (1.803 m)   Wt 178 lb 9.6 oz (81 kg)   SpO2 99%   BMI 24.91 kg/m   Body mass index is 24.91 kg/m.     ASSESSMENT/ PLAN:    Diabetes type 2:   Current regimen: Amaryl, metformin, RYBELSUS, Januvia and Jardiance  See history of present illness for detailed discussion of current diabetes management, blood sugar patterns and problems identified  A1c is 8.7 and not better than before  Blood glucose control is likely not good after meals since his fasting readings are not consistently high He has not checked his blood sugars after meal Also not clear if he is getting proper absorption of Rybelsus since he takes it with coffee in the morning Overall fasting readings are little better with them when compared to prior He can do better with diet and has only recently started exercise  Recommendations:  Take Rybelsus with water only in the morning 30 minutes  before any coffee or food Increase the dose to 14 mg Do not refill Januvia Continue other medications unchanged Consistent diet Start checking blood sugars consistently 2 hours after various meals by rotation and limit foods that tend to raise his blood sugars more   Patient Instructions  Take 14 mg Rybelsus with water 30 min before 1st meal of day  Check blood sugars on waking up 2-3 days a week  Also check blood sugars about 2 hours after meals and do this after different meals by rotation  Recommended blood sugar levels on waking up are 90-130 and about 2 hours after meal is 130-160  Please bring your blood sugar monitor to each visit, thank you     Elayne Snare 09/10/2022, 4:39 PM   Addendum: Apparently Rybelsus will be $25 per month for 90-day supply and will recommend that he start with 3 mg sample and then 7 mg daily

## 2022-09-12 DIAGNOSIS — M25512 Pain in left shoulder: Secondary | ICD-10-CM | POA: Diagnosis not present

## 2022-09-12 DIAGNOSIS — M6281 Muscle weakness (generalized): Secondary | ICD-10-CM | POA: Diagnosis not present

## 2022-09-13 DIAGNOSIS — S76012A Strain of muscle, fascia and tendon of left hip, initial encounter: Secondary | ICD-10-CM | POA: Diagnosis not present

## 2022-09-17 DIAGNOSIS — M6281 Muscle weakness (generalized): Secondary | ICD-10-CM | POA: Diagnosis not present

## 2022-09-17 DIAGNOSIS — M25512 Pain in left shoulder: Secondary | ICD-10-CM | POA: Diagnosis not present

## 2022-09-19 DIAGNOSIS — M25512 Pain in left shoulder: Secondary | ICD-10-CM | POA: Diagnosis not present

## 2022-09-19 DIAGNOSIS — M6281 Muscle weakness (generalized): Secondary | ICD-10-CM | POA: Diagnosis not present

## 2022-09-25 DIAGNOSIS — M6281 Muscle weakness (generalized): Secondary | ICD-10-CM | POA: Diagnosis not present

## 2022-09-25 DIAGNOSIS — M25512 Pain in left shoulder: Secondary | ICD-10-CM | POA: Diagnosis not present

## 2022-09-26 ENCOUNTER — Telehealth: Payer: Self-pay

## 2022-09-26 NOTE — Telephone Encounter (Signed)
Patient called in and states he is having side effects from metformin or Rybelsus. Constant heartburn, joint pain in groin, shoulder, and elbow, also reports he is tired all the time and not sleeping. Please advise.

## 2022-09-27 DIAGNOSIS — M25512 Pain in left shoulder: Secondary | ICD-10-CM | POA: Diagnosis not present

## 2022-09-27 DIAGNOSIS — M6281 Muscle weakness (generalized): Secondary | ICD-10-CM | POA: Diagnosis not present

## 2022-10-01 DIAGNOSIS — M6281 Muscle weakness (generalized): Secondary | ICD-10-CM | POA: Diagnosis not present

## 2022-10-01 DIAGNOSIS — M25512 Pain in left shoulder: Secondary | ICD-10-CM | POA: Diagnosis not present

## 2022-10-03 DIAGNOSIS — M6281 Muscle weakness (generalized): Secondary | ICD-10-CM | POA: Diagnosis not present

## 2022-10-03 DIAGNOSIS — M25512 Pain in left shoulder: Secondary | ICD-10-CM | POA: Diagnosis not present

## 2022-10-08 ENCOUNTER — Other Ambulatory Visit: Payer: Self-pay | Admitting: Endocrinology

## 2022-10-08 DIAGNOSIS — M6281 Muscle weakness (generalized): Secondary | ICD-10-CM | POA: Diagnosis not present

## 2022-10-08 DIAGNOSIS — M25512 Pain in left shoulder: Secondary | ICD-10-CM | POA: Diagnosis not present

## 2022-10-08 NOTE — Telephone Encounter (Signed)
Sent mychart message as requested by patient.

## 2022-10-10 DIAGNOSIS — M25512 Pain in left shoulder: Secondary | ICD-10-CM | POA: Diagnosis not present

## 2022-10-10 DIAGNOSIS — M6281 Muscle weakness (generalized): Secondary | ICD-10-CM | POA: Diagnosis not present

## 2022-10-15 DIAGNOSIS — M25512 Pain in left shoulder: Secondary | ICD-10-CM | POA: Diagnosis not present

## 2022-10-15 DIAGNOSIS — M6281 Muscle weakness (generalized): Secondary | ICD-10-CM | POA: Diagnosis not present

## 2022-10-16 DIAGNOSIS — M2550 Pain in unspecified joint: Secondary | ICD-10-CM | POA: Diagnosis not present

## 2022-10-16 DIAGNOSIS — R5383 Other fatigue: Secondary | ICD-10-CM | POA: Diagnosis not present

## 2022-10-16 DIAGNOSIS — G729 Myopathy, unspecified: Secondary | ICD-10-CM | POA: Diagnosis not present

## 2022-10-17 DIAGNOSIS — M6281 Muscle weakness (generalized): Secondary | ICD-10-CM | POA: Diagnosis not present

## 2022-10-17 DIAGNOSIS — M25512 Pain in left shoulder: Secondary | ICD-10-CM | POA: Diagnosis not present

## 2022-10-22 DIAGNOSIS — M25512 Pain in left shoulder: Secondary | ICD-10-CM | POA: Diagnosis not present

## 2022-10-22 DIAGNOSIS — M6281 Muscle weakness (generalized): Secondary | ICD-10-CM | POA: Diagnosis not present

## 2022-10-22 DIAGNOSIS — R531 Weakness: Secondary | ICD-10-CM | POA: Diagnosis not present

## 2022-10-28 ENCOUNTER — Other Ambulatory Visit: Payer: Self-pay | Admitting: Endocrinology

## 2022-10-31 DIAGNOSIS — M25512 Pain in left shoulder: Secondary | ICD-10-CM | POA: Diagnosis not present

## 2022-10-31 DIAGNOSIS — M6281 Muscle weakness (generalized): Secondary | ICD-10-CM | POA: Diagnosis not present

## 2022-11-05 DIAGNOSIS — M6281 Muscle weakness (generalized): Secondary | ICD-10-CM | POA: Diagnosis not present

## 2022-11-05 DIAGNOSIS — R29898 Other symptoms and signs involving the musculoskeletal system: Secondary | ICD-10-CM | POA: Diagnosis not present

## 2022-11-05 DIAGNOSIS — M2550 Pain in unspecified joint: Secondary | ICD-10-CM | POA: Diagnosis not present

## 2022-11-05 DIAGNOSIS — M25512 Pain in left shoulder: Secondary | ICD-10-CM | POA: Diagnosis not present

## 2022-11-08 DIAGNOSIS — M6281 Muscle weakness (generalized): Secondary | ICD-10-CM | POA: Diagnosis not present

## 2022-11-08 DIAGNOSIS — M25512 Pain in left shoulder: Secondary | ICD-10-CM | POA: Diagnosis not present

## 2022-11-12 DIAGNOSIS — M6281 Muscle weakness (generalized): Secondary | ICD-10-CM | POA: Diagnosis not present

## 2022-11-12 DIAGNOSIS — M25512 Pain in left shoulder: Secondary | ICD-10-CM | POA: Diagnosis not present

## 2022-11-19 DIAGNOSIS — M25512 Pain in left shoulder: Secondary | ICD-10-CM | POA: Diagnosis not present

## 2022-11-19 DIAGNOSIS — M6281 Muscle weakness (generalized): Secondary | ICD-10-CM | POA: Diagnosis not present

## 2022-11-21 DIAGNOSIS — M6281 Muscle weakness (generalized): Secondary | ICD-10-CM | POA: Diagnosis not present

## 2022-11-21 DIAGNOSIS — M25512 Pain in left shoulder: Secondary | ICD-10-CM | POA: Diagnosis not present

## 2022-11-26 DIAGNOSIS — M25512 Pain in left shoulder: Secondary | ICD-10-CM | POA: Diagnosis not present

## 2022-11-26 DIAGNOSIS — M6281 Muscle weakness (generalized): Secondary | ICD-10-CM | POA: Diagnosis not present

## 2022-11-28 DIAGNOSIS — M25512 Pain in left shoulder: Secondary | ICD-10-CM | POA: Diagnosis not present

## 2022-11-28 DIAGNOSIS — M6281 Muscle weakness (generalized): Secondary | ICD-10-CM | POA: Diagnosis not present

## 2022-12-02 ENCOUNTER — Encounter: Payer: Self-pay | Admitting: Diagnostic Neuroimaging

## 2022-12-02 ENCOUNTER — Ambulatory Visit (INDEPENDENT_AMBULATORY_CARE_PROVIDER_SITE_OTHER): Payer: Medicare Other | Admitting: Diagnostic Neuroimaging

## 2022-12-02 VITALS — BP 120/74 | HR 80 | Ht 71.0 in | Wt 176.4 lb

## 2022-12-02 DIAGNOSIS — M2550 Pain in unspecified joint: Secondary | ICD-10-CM | POA: Diagnosis not present

## 2022-12-02 DIAGNOSIS — E1142 Type 2 diabetes mellitus with diabetic polyneuropathy: Secondary | ICD-10-CM | POA: Diagnosis not present

## 2022-12-02 NOTE — Patient Instructions (Signed)
  DIABETIC NEUROPATHY - A1c 8.7; continue diabetes control - continue gabapentin '100mg'$  daily; could be increased   JOINT PAIN / ARTHRALGIAS / STIFFNESS - consider rheumatology referral - continue PT / OT exercises

## 2022-12-02 NOTE — Progress Notes (Signed)
[facility]  PATIENT: Nathan Duncan DOB: December 07, 1951  REFERRING CLINICIAN: Masneri, Adele Barthel, DO HISTORY FROM: patient  REASON FOR VISIT: new consult   HISTORICAL  CHIEF COMPLAINT:  Chief Complaint  Patient presents with   Room 6    Pt is here with his Wife. Pt states that he has numbness in his hands, feet, and shoulders. Pt's wife sated that symptoms stated being more noticeable when his doctor gave him new medication.     HISTORY OF PRESENT ILLNESS:   71 year old male here for evaluation of numbness and tingling and weakness.  History of diabetes.  Patient started with numbness and tingling in toes and feet around 5 years ago this is progressively worsened over time.  In last 1 year he has noticed numbness and tingling in his fingers and hands.  In the past 1 year as noted increasing joint pain in his shoulders, elbows, wrists, fingers, knees and ankles.  Pain limits muscle strength.   REVIEW OF SYSTEMS: Full 14 system review of systems performed and negative with exception of: as per   ALLERGIES: No Known Allergies  HOME MEDICATIONS: Outpatient Medications Prior to Visit  Medication Sig Dispense Refill   aspirin EC 81 MG tablet Take 81 mg by mouth daily.     atorvastatin (LIPITOR) 40 MG tablet Take 40 mg by mouth daily.      gabapentin (NEURONTIN) 100 MG capsule Take 1 capsule (100 mg total) by mouth at bedtime. 30 capsule 3   glimepiride (AMARYL) 4 MG tablet TAKE 1 TABLET BY MOUTH ONCE DAILY BEFORE SUPPER 30 tablet 2   JARDIANCE 25 MG TABS tablet Take 1 tablet by mouth once daily 90 tablet 2   lisinopril (PRINIVIL,ZESTRIL) 5 MG tablet      metFORMIN (GLUCOPHAGE-XR) 500 MG 24 hr tablet TAKE 4 TABLETS BY MOUTH ONCE DAILY WITH BREAKFAST 360 tablet 1   sitaGLIPtin (JANUVIA) 100 MG tablet Take 1 tablet by mouth once daily 30 tablet 2   ACCU-CHEK SMARTVIEW test strip USE 1 STRIP TO CHECK GLUCOSE 4 TIMES DAILY 200 each 0   Blood Glucose Monitoring  Suppl (ACCU-CHEK NANO SMARTVIEW) w/Device KIT 1 each by Does not apply route daily. 1 kit 0   Semaglutide (RYBELSUS) 7 MG TABS Take 7 mg by mouth daily. 90 tablet 0   No facility-administered medications prior to visit.    PAST MEDICAL HISTORY: Past Medical History:  Diagnosis Date   Diabetes ([place])    Dyslipidemia     PAST SURGICAL HISTORY: No past surgical history on file.  FAMILY HISTORY: Family History  Problem Relation Age of Onset   Diabetes Mother     SOCIAL HISTORY: Social History   Socioeconomic History   Marital status: Married    Spouse name: Not on file   Number of children: Not on file   Years of education: Not on file   Highest education level: Not on file  Occupational History   Not on file  Tobacco Use   Smoking status: Former   Smokeless tobacco: Never  Substance and Sexual Activity   Alcohol use: Yes    Comment: occ or 1 per month   Drug use: Never   Sexual activity: Not on file  Other Topics Concern   Not on file  Social History Narrative   Not on file   Social Determinants of Health   Financial Resource Strain: Not on file  Food Insecurity: Not on file  Transportation Needs: Not on file  Physical Activity: Not on file  Stress: Not on file  Social Connections: Not on file  Intimate Partner Violence: Not on file     PHYSICAL EXAM  GENERAL EXAM/CONSTITUTIONAL: Vitals:  Vitals:   12/02/22 1125  BP: 120/74  Pulse: 80  Weight: 176 lb 6.4 oz (80 kg)  Height: _0  (1.803 m)   Body mass index is 24.6 kg/m. Wt Readings from Last 3 Encounters:  12/02/22 176 lb 6.4 oz (80 kg)  09/10/22 178 lb 9.6 oz (81 kg)  07/24/22 183 lb (83 kg)   Patient is in no distress; well developed, nourished and groomed; neck is supple  CARDIOVASCULAR: Examination of carotid arteries is normal; no carotid bruits Regular rate and rhythm, no murmurs Examination of peripheral vascular system by observation and palpation is  normal  EYES: Ophthalmoscopic exam of optic discs and posterior segments is normal; no papilledema or hemorrhages No results found.  MUSCULOSKELETAL: Gait, strength, tone, movements noted in Neurologic exam below  NEUROLOGIC: MENTAL STATUS:      No data to display         awake, alert, oriented to person, place and time recent and remote memory intact normal attention and concentration language fluent, comprehension intact, naming intact fund of knowledge appropriate  CRANIAL NERVE:  2nd - no papilledema on fundoscopic exam 2nd, 3rd, 4th, 6th - pupils equal and reactive to light, visual fields full to confrontation, extraocular muscles intact, no nystagmus 5th - facial sensation symmetric 7th - facial strength symmetric 8th - hearing intact 9th - palate elevates symmetrically, uvula midline 11th - shoulder shrug symmetric 12th - tongue protrusion midline  MOTOR:  normal bulk and tone, full strength in the BUE, BLE  SENSORY:  normal and symmetric to light touch, pinprick, temperature, vibration; EXCEPT DECR IN FEET  COORDINATION:  finger-nose-finger, fine finger movements normal  REFLEXES:  deep tendon reflexes TRACE and symmetric  GAIT/STATION:  narrow based gait; SLIGHTLY ANTALGIC     DIAGNOSTIC DATA (LABS, IMAGING, TESTING) - I reviewed patient records, labs, notes, testing and imaging myself where available.  No results found for: "WBC", "HGB", "HCT", "MCV", "PLT"    Component Value Date/Time   NA 138 08/21/2022 0751   K 4.7 08/21/2022 0751   CL 102 08/21/2022 0751   CO2 27 08/21/2022 0751   GLUCOSE 157 (H) 08/21/2022 0751   BUN 22 08/21/2022 0751   CREATININE 1.09 08/21/2022 0751   CALCIUM 9.9 08/21/2022 0751   No results found for: "CHOL", "HDL", "LDLCALC", "LDLDIRECT", "TRIG", "CHOLHDL" Lab Results  Component Value Date   HGBA1C 8.7 (H) 08/21/2022   No results found for: "VITAMINB12" Lab Results  Component Value Date   TSH 1.21  02/20/2021        ASSESSMENT AND PLAN  71 y.o. year old male here with:   Dx:  1. Diabetic polyneuropathy associated with type 2 diabetes mellitus ([place])   2. Arthralgia, unspecified joint      PLAN:  DIABETIC NEUROPATHY (numbness in hands, feet) - A1c 8.7; continue diabetes control - continue gabapentin 190m daily; could be increased for pain  JOINT PAIN / ARTHRALGIAS / STIFFNESS (diffuse) - consider rheumatology referral - continue PT / OT exercises  No orders of the defined types were placed in this encounter.   No orders of the defined types were placed in this encounter.   No follow-ups on file.    VPenni Bombard MD [id number] 102:40AM Certified in Neurology, Neurophysiology and Neuroimaging  [facility]  [address] [place], [place] [address] [phone]

## 2022-12-03 DIAGNOSIS — M25512 Pain in left shoulder: Secondary | ICD-10-CM | POA: Diagnosis not present

## 2022-12-03 DIAGNOSIS — M6281 Muscle weakness (generalized): Secondary | ICD-10-CM | POA: Diagnosis not present

## 2022-12-05 DIAGNOSIS — M25512 Pain in left shoulder: Secondary | ICD-10-CM | POA: Diagnosis not present

## 2022-12-05 DIAGNOSIS — M6281 Muscle weakness (generalized): Secondary | ICD-10-CM | POA: Diagnosis not present

## 2022-12-10 DIAGNOSIS — M25512 Pain in left shoulder: Secondary | ICD-10-CM | POA: Diagnosis not present

## 2022-12-10 DIAGNOSIS — M6281 Muscle weakness (generalized): Secondary | ICD-10-CM | POA: Diagnosis not present

## 2022-12-11 DIAGNOSIS — M6281 Muscle weakness (generalized): Secondary | ICD-10-CM | POA: Diagnosis not present

## 2022-12-11 DIAGNOSIS — M25512 Pain in left shoulder: Secondary | ICD-10-CM | POA: Diagnosis not present

## 2022-12-17 DIAGNOSIS — M6281 Muscle weakness (generalized): Secondary | ICD-10-CM | POA: Diagnosis not present

## 2022-12-17 DIAGNOSIS — M25512 Pain in left shoulder: Secondary | ICD-10-CM | POA: Diagnosis not present

## 2022-12-18 ENCOUNTER — Ambulatory Visit: Payer: Medicare Other | Admitting: Endocrinology

## 2022-12-18 ENCOUNTER — Other Ambulatory Visit: Payer: Medicare Other

## 2022-12-19 DIAGNOSIS — M25512 Pain in left shoulder: Secondary | ICD-10-CM | POA: Diagnosis not present

## 2022-12-19 DIAGNOSIS — M6281 Muscle weakness (generalized): Secondary | ICD-10-CM | POA: Diagnosis not present

## 2022-12-20 ENCOUNTER — Other Ambulatory Visit (INDEPENDENT_AMBULATORY_CARE_PROVIDER_SITE_OTHER): Payer: Medicare Other

## 2022-12-20 DIAGNOSIS — E1165 Type 2 diabetes mellitus with hyperglycemia: Secondary | ICD-10-CM | POA: Diagnosis not present

## 2022-12-20 LAB — BASIC METABOLIC PANEL
BUN: 31 mg/dL — ABNORMAL HIGH (ref 6–23)
CO2: 25 mEq/L (ref 19–32)
Calcium: 9.6 mg/dL (ref 8.4–10.5)
Chloride: 102 mEq/L (ref 96–112)
Creatinine, Ser: 1.33 mg/dL (ref 0.40–1.50)
GFR: 52.87 mL/min — ABNORMAL LOW (ref >60.00)
Glucose, Bld: 222 mg/dL — ABNORMAL HIGH (ref 70–99)
Potassium: 4.1 mEq/L (ref 3.5–5.1)
Sodium: 138 mEq/L (ref 135–145)

## 2022-12-20 LAB — HEMOGLOBIN A1C: Hgb A1c MFr Bld: 9.2 % — ABNORMAL HIGH (ref 4.6–6.5)

## 2022-12-24 ENCOUNTER — Ambulatory Visit (INDEPENDENT_AMBULATORY_CARE_PROVIDER_SITE_OTHER): Payer: Medicare Other | Admitting: Endocrinology

## 2022-12-24 ENCOUNTER — Encounter: Payer: Medicare Other | Attending: Endocrinology | Admitting: Nutrition

## 2022-12-24 ENCOUNTER — Encounter: Payer: Self-pay | Admitting: Endocrinology

## 2022-12-24 VITALS — BP 136/68 | HR 84 | Ht 71.0 in | Wt 177.4 lb

## 2022-12-24 DIAGNOSIS — E1165 Type 2 diabetes mellitus with hyperglycemia: Secondary | ICD-10-CM

## 2022-12-24 DIAGNOSIS — E1142 Type 2 diabetes mellitus with diabetic polyneuropathy: Secondary | ICD-10-CM | POA: Diagnosis not present

## 2022-12-24 MED ORDER — TRESIBA FLEXTOUCH 100 UNIT/ML ~~LOC~~ SOPN: PEN_INJECTOR | SUBCUTANEOUS | 1 refills | Status: DC

## 2022-12-24 MED ORDER — INSULIN PEN NEEDLE 31G X 5 MM MISC: 1 refills | Status: DC

## 2022-12-24 NOTE — Patient Instructions (Signed)
Check blood sugars on waking up   Also check blood sugars about 2 hours after meals and do this after different meals by rotation  Recommended blood sugar levels on waking up are 90-130 and about 2 hours after meal is 130-180  Please bring your blood sugar monitor to each visit, thank you  Tresiba   insulin: This insulin provides blood sugar control for up to 24 hours.   Start with 6 units   daily and increase by 2 units every 3 days until the waking up sugars are under 130. Then continue the same dose.  If blood sugar is under 90 for 2 days in a row, reduce the dose by 2 units.  Note that this insulin does not control the rise of blood sugar with meals

## 2022-12-24 NOTE — Patient Instructions (Addendum)
Review directions in pen box on how to attach needle and give insulin dose Take 6u of Tresiba every evening ADjust the dose every 3 days by 2u if morning blood sugars are over 140.   Call office if blood sugars drop below 70 or remain over 250.

## 2022-12-24 NOTE — Progress Notes (Signed)
Patient was trained on the use of the tresiba insulin pump.  We discussed how this insulin works, when and how to take this and how to adjust the insulin dose.  He is very afraid of low blood sugars.  We reviewed the symptoms and treatment.  Sheet reviewed how to adjust this insulin dose, starting at 6u every night, and increasing the dose by 2u every 3 nights if FBSs are all over 140.   Diet questions answered.  He had no final questions.

## 2022-12-24 NOTE — Progress Notes (Signed)
Patient ID: Nathan Duncan, male   DOB: 1951/04/04, 72 y.o.   MRN: [id number]           Reason for Appointment: Type II Diabetes follow-up   History of Present Illness   Diagnosis date: 1985  Previous history:  Non-insulin hypoglycemic drugs previously used: Metformin, Januvia, Amaryl, Invokana, Rybelsus Actos was started in June 2022 He has never been on insulin  A1c range in the last few years is: 7.4-8.9  Recent history:     Non-insulin hypoglycemic drugs: metformin 2 g daily, Jardiance 25 mg, Amaryl 4 mg at dinner, Januvia 100 mg daily        Side effects from medications: None  Current self management, blood sugar patterns and problems identified:  A1c is again higher, 9.2 compared to 8.7 He did use Rybelsus 3 mg previously for some time but when he was supposed to move up to 7 mg but he apparently started having GI side effects and has been back on Januvia for at least a month  However his blood sugars are higher with readings as high as 255 Also has lab glucose of 222 is higher than his home fasting readings are averaging 160 He has previously been instructed on meal planning by the dietitian Weight is about the same  Blood sugars reviewed from his Liv[place] meter appears to be checking mostly fasting readings Has been doing some regular walking  Hypoglycemia:  none    Glucometer:[place]  Blood Glucose readings from download:   PRE-MEAL Fasting Lunch Dinner Bedtime Overall  Glucose range: 119-229 220 229  119-255  Mean/median: 160    167   POST-MEAL PC Breakfast PC Lunch PC Dinner  Glucose range:   217  Mean/median:      Previously 30-day average 138 with recent range 114-177, nonfasting 151   Dietician visit: Most recent: 05/13/22     Weight control:  Wt Readings from Last 3 Encounters:  12/24/22 177 lb 6.4 oz (80.5 kg)  12/02/22 176 lb 6.4 oz (80 kg)  09/10/22 178 lb 9.6 oz (81 kg)            Diabetes labs:  Lab Results  Component Value Date    HGBA1C 9.2 (H) 12/20/2022   HGBA1C 8.7 (H) 08/21/2022   HGBA1C 8.7 (A) 06/27/2022   Lab Results  Component Value Date   MICROALBUR 2.6 (H) 06/27/2022   CREATININE 1.33 12/20/2022     Allergies as of 12/24/2022   No Known Allergies      Medication List        Accurate as of December 24, 2022  9:44 AM. If you have any questions, ask your nurse or doctor.          aspirin EC 81 MG tablet Take 81 mg by mouth daily.   atorvastatin 40 MG tablet Commonly known as: LIPITOR Take 40 mg by mouth daily.   gabapentin 100 MG capsule Commonly known as: NEURONTIN Take 1 capsule (100 mg total) by mouth at bedtime.   glimepiride 4 MG tablet Commonly known as: AMARYL TAKE 1 TABLET BY MOUTH ONCE DAILY BEFORE SUPPER   Januvia 100 MG tablet Generic drug: sitaGLIPtin Take 1 tablet by mouth once daily   Jardiance 25 MG Tabs tablet Generic drug: empagliflozin Take 1 tablet by mouth once daily   lisinopril 5 MG tablet Commonly known as: ZESTRIL   metFORMIN 500 MG 24 hr tablet Commonly known as: GLUCOPHAGE-XR TAKE 4 TABLETS BY MOUTH ONCE DAILY WITH BREAKFAST  Allergies: No Known Allergies  Past Medical History:  Diagnosis Date   Diabetes ([place])    Dyslipidemia     No past surgical history on file.  Family History  Problem Relation Age of Onset   Diabetes Mother     Social History:  reports that he has quit smoking. He has never used smokeless tobacco. He reports current alcohol use. He reports that he does not use drugs.  Review of Systems:  Last diabetic eye exam date 3/23  Last foot exam date: 9/22  Symptoms of neuropathy: pins and needles in his feet and different locations, sometimes affecting his sleep He was prescribed gabapentin and does not appear to be taking it regularly  He is having some numbness in his right hand and is waiting for rheumatology consult  Last urine microalbumin: 7/23  Hypertension:   Treatment includes lisinopril 5 mg  daily  BP Readings from Last 3 Encounters:  12/24/22 136/68  12/02/22 120/74  09/10/22 (!) 148/70    Lipid management: His last LDL is 93 This is being followed by his PCP and treated with atorvastatin 40 mg daily    No results found for: "CHOL" No results found for: "HDL" No results found for: "LDLCALC" No results found for: "TRIG" No results found for: "CHOLHDL" No results found for: "LDLDIRECT"  No results found for: "ALT"   Examination:   BP 136/68 (BP Location: Left Arm, Patient Position: Sitting, Cuff Size: Normal)   Pulse 84   Ht '5\' 11"'$  (1.803 m)   Wt 177 lb 6.4 oz (80.5 kg)   SpO2 99%   BMI 24.74 kg/m   Body mass index is 24.74 kg/m.     ASSESSMENT/ PLAN:    Diabetes type 2:   Current regimen: Amaryl, metformin, RYBELSUS, Januvia and Jardiance  See history of present illness for detailed discussion of current diabetes management, blood sugar patterns and problems identified  A1c is up to 9.2  Blood glucose control is likely worse because of insulin deficiency He could not tolerate more than a small dose of Rybelsus Considering his duration of diabetes and BMI under 25 likely needs to be starting on insulin Also needs reevaluation of his diet and regular exercise program  Blood sugar monitoring is somewhat difficult to interpret because of using Livongo meter   Recommendations:   Start basal insulin using [place] Discussed how basal insulin works, timing of injection, dosage, injection sites.  Also discussed titration based on fasting blood sugar every 3 days by 2 units and at target of 90-130 for fasting reading.  Given a flowsheet with instructions on how to keep a record and adjust the doses He was also instructed in detail by the diabetes educator on the injection technique and how to follow the titration schedule He will let us know if Tyler Aas is not covered He can stop Januvia as this is unlikely to be helping If blood sugars are higher than  target consistently after meals may consider mealtime insulin and stopping Amaryl Continue Jardiance and metformin Follow in about 6 to 8 weeks, in the meantime he can send [place] an update every 2 to 3 weeks on his blood sugars  Diabetic neuropathy: Has been prescribed gabapentin   There are no Patient Instructions on file for this visit.  Total visit time for evaluation and management and counseling = 30 minutes  Elayne Snare 12/24/2022, 9:44 AM

## 2022-12-25 DIAGNOSIS — M25512 Pain in left shoulder: Secondary | ICD-10-CM | POA: Diagnosis not present

## 2022-12-25 DIAGNOSIS — M6281 Muscle weakness (generalized): Secondary | ICD-10-CM | POA: Diagnosis not present

## 2022-12-26 DIAGNOSIS — E113312 Type 2 diabetes mellitus with moderate nonproliferative diabetic retinopathy with macular edema, left eye: Secondary | ICD-10-CM | POA: Diagnosis not present

## 2022-12-26 DIAGNOSIS — H52223 Regular astigmatism, bilateral: Secondary | ICD-10-CM | POA: Diagnosis not present

## 2022-12-26 DIAGNOSIS — E113391 Type 2 diabetes mellitus with moderate nonproliferative diabetic retinopathy without macular edema, right eye: Secondary | ICD-10-CM | POA: Diagnosis not present

## 2022-12-26 DIAGNOSIS — H25813 Combined forms of age-related cataract, bilateral: Secondary | ICD-10-CM | POA: Diagnosis not present

## 2022-12-26 DIAGNOSIS — H524 Presbyopia: Secondary | ICD-10-CM | POA: Diagnosis not present

## 2022-12-26 DIAGNOSIS — H5213 Myopia, bilateral: Secondary | ICD-10-CM | POA: Diagnosis not present

## 2022-12-26 DIAGNOSIS — H04123 Dry eye syndrome of bilateral lacrimal glands: Secondary | ICD-10-CM | POA: Diagnosis not present

## 2022-12-27 ENCOUNTER — Encounter: Payer: Self-pay | Admitting: Endocrinology

## 2022-12-27 DIAGNOSIS — M25512 Pain in left shoulder: Secondary | ICD-10-CM | POA: Diagnosis not present

## 2022-12-27 DIAGNOSIS — M6281 Muscle weakness (generalized): Secondary | ICD-10-CM | POA: Diagnosis not present

## 2022-12-30 DIAGNOSIS — H35372 Puckering of macula, left eye: Secondary | ICD-10-CM | POA: Diagnosis not present

## 2022-12-30 DIAGNOSIS — E113313 Type 2 diabetes mellitus with moderate nonproliferative diabetic retinopathy with macular edema, bilateral: Secondary | ICD-10-CM | POA: Diagnosis not present

## 2022-12-30 DIAGNOSIS — H43823 Vitreomacular adhesion, bilateral: Secondary | ICD-10-CM | POA: Diagnosis not present

## 2022-12-31 DIAGNOSIS — M6281 Muscle weakness (generalized): Secondary | ICD-10-CM | POA: Diagnosis not present

## 2022-12-31 DIAGNOSIS — M25512 Pain in left shoulder: Secondary | ICD-10-CM | POA: Diagnosis not present

## 2023-01-01 ENCOUNTER — Ambulatory Visit: Payer: Medicare Other | Admitting: Diagnostic Neuroimaging

## 2023-01-02 DIAGNOSIS — M6281 Muscle weakness (generalized): Secondary | ICD-10-CM | POA: Diagnosis not present

## 2023-01-02 DIAGNOSIS — M25512 Pain in left shoulder: Secondary | ICD-10-CM | POA: Diagnosis not present

## 2023-01-23 ENCOUNTER — Other Ambulatory Visit: Payer: Self-pay | Admitting: Endocrinology

## 2023-01-31 DIAGNOSIS — Z6825 Body mass index (BMI) 25.0-25.9, adult: Secondary | ICD-10-CM | POA: Diagnosis not present

## 2023-01-31 DIAGNOSIS — M79642 Pain in left hand: Secondary | ICD-10-CM | POA: Diagnosis not present

## 2023-01-31 DIAGNOSIS — M2560 Stiffness of unspecified joint, not elsewhere classified: Secondary | ICD-10-CM | POA: Diagnosis not present

## 2023-01-31 DIAGNOSIS — R7982 Elevated C-reactive protein (CRP): Secondary | ICD-10-CM | POA: Diagnosis not present

## 2023-01-31 DIAGNOSIS — M79641 Pain in right hand: Secondary | ICD-10-CM | POA: Diagnosis not present

## 2023-01-31 DIAGNOSIS — E663 Overweight: Secondary | ICD-10-CM | POA: Diagnosis not present

## 2023-01-31 DIAGNOSIS — R700 Elevated erythrocyte sedimentation rate: Secondary | ICD-10-CM | POA: Diagnosis not present

## 2023-02-03 ENCOUNTER — Other Ambulatory Visit: Payer: Self-pay | Admitting: Endocrinology

## 2023-02-03 MED ORDER — NOVOLOG FLEXPEN 100 UNIT/ML ~~LOC~~ SOPN: PEN_INJECTOR | SUBCUTANEOUS | 1 refills | Status: DC

## 2023-02-16 ENCOUNTER — Other Ambulatory Visit: Payer: Self-pay | Admitting: Endocrinology

## 2023-02-17 ENCOUNTER — Encounter: Payer: Self-pay | Admitting: Endocrinology

## 2023-02-17 DIAGNOSIS — M2560 Stiffness of unspecified joint, not elsewhere classified: Secondary | ICD-10-CM | POA: Diagnosis not present

## 2023-02-17 DIAGNOSIS — R700 Elevated erythrocyte sedimentation rate: Secondary | ICD-10-CM | POA: Diagnosis not present

## 2023-02-17 DIAGNOSIS — R7982 Elevated C-reactive protein (CRP): Secondary | ICD-10-CM | POA: Diagnosis not present

## 2023-02-17 DIAGNOSIS — Z6825 Body mass index (BMI) 25.0-25.9, adult: Secondary | ICD-10-CM | POA: Diagnosis not present

## 2023-02-17 DIAGNOSIS — M79642 Pain in left hand: Secondary | ICD-10-CM | POA: Diagnosis not present

## 2023-02-17 DIAGNOSIS — M79641 Pain in right hand: Secondary | ICD-10-CM | POA: Diagnosis not present

## 2023-02-17 DIAGNOSIS — E663 Overweight: Secondary | ICD-10-CM | POA: Diagnosis not present

## 2023-02-19 ENCOUNTER — Other Ambulatory Visit: Payer: Self-pay | Admitting: Endocrinology

## 2023-02-22 ENCOUNTER — Other Ambulatory Visit: Payer: Self-pay | Admitting: Endocrinology

## 2023-02-24 ENCOUNTER — Other Ambulatory Visit: Payer: Self-pay

## 2023-02-24 MED ORDER — INSULIN PEN NEEDLE 31G X 5 MM MISC: 3 refills | Status: DC

## 2023-03-02 ENCOUNTER — Encounter: Payer: Self-pay | Admitting: Endocrinology

## 2023-03-04 ENCOUNTER — Other Ambulatory Visit (INDEPENDENT_AMBULATORY_CARE_PROVIDER_SITE_OTHER): Payer: Medicare Other

## 2023-03-04 DIAGNOSIS — G4733 Obstructive sleep apnea (adult) (pediatric): Secondary | ICD-10-CM | POA: Diagnosis not present

## 2023-03-04 DIAGNOSIS — E1165 Type 2 diabetes mellitus with hyperglycemia: Secondary | ICD-10-CM

## 2023-03-04 DIAGNOSIS — E1142 Type 2 diabetes mellitus with diabetic polyneuropathy: Secondary | ICD-10-CM | POA: Diagnosis not present

## 2023-03-04 DIAGNOSIS — I10 Essential (primary) hypertension: Secondary | ICD-10-CM | POA: Diagnosis not present

## 2023-03-04 LAB — BASIC METABOLIC PANEL
BUN: 27 mg/dL — ABNORMAL HIGH (ref 6–23)
CO2: 23 mEq/L (ref 19–32)
Calcium: 9.9 mg/dL (ref 8.4–10.5)
Chloride: 103 mEq/L (ref 96–112)
Creatinine, Ser: 1.11 mg/dL (ref 0.40–1.50)
GFR: 65.59 mL/min (ref >60.00)
Glucose, Bld: 142 mg/dL — ABNORMAL HIGH (ref 70–99)
Potassium: 4.5 mEq/L (ref 3.5–5.1)
Sodium: 137 mEq/L (ref 135–145)

## 2023-03-05 LAB — FRUCTOSAMINE: Fructosamine: 350 umol/L — ABNORMAL HIGH (ref 0–285)

## 2023-03-07 ENCOUNTER — Ambulatory Visit (INDEPENDENT_AMBULATORY_CARE_PROVIDER_SITE_OTHER): Payer: Medicare Other | Admitting: Endocrinology

## 2023-03-07 ENCOUNTER — Encounter: Payer: Self-pay | Admitting: Endocrinology

## 2023-03-07 VITALS — BP 132/64 | HR 63 | Ht 71.0 in | Wt 179.8 lb

## 2023-03-07 DIAGNOSIS — E1165 Type 2 diabetes mellitus with hyperglycemia: Secondary | ICD-10-CM | POA: Diagnosis not present

## 2023-03-07 NOTE — Patient Instructions (Addendum)
4 units of insulin for most meals that have significant carbohydrate or relatively high fat like biscuits  If you are interested in using the freestyle libre sensor please let us know and we will order it through the DME supplier

## 2023-03-07 NOTE — Progress Notes (Unsigned)
Patient ID: Nathan Duncan, male   DOB: 1951/05/26, 72 y.o.   MRN: [id number]           Reason for Appointment: Type II Diabetes follow-up   History of Present Illness   Diagnosis date: 1985  Previous history:  Non-insulin hypoglycemic drugs previously used: Metformin, Januvia, Amaryl, Invokana, Rybelsus Actos was started in June 2022 He has never been on insulin  A1c range in the last few years is: 7.4-8.9  Recent history:     Non-insulin hypoglycemic drugs: metformin 2 g daily, Jardiance 25 mg, Amaryl 4 mg at dinner, Januvia 100 mg daily        Side effects from medications: None  Current self management, blood sugar patterns and problems identified:  A1c is again higher, 9.2 compared to 8.7 He did use Rybelsus 3 mg previously for some time but when he was supposed to move up to 7 mg but he apparently started having GI side effects and has been back on Januvia for at least a month  However his blood sugars are higher with readings as high as 255 Also has lab glucose of 222 is higher than his home fasting readings are averaging 160 He has previously been instructed on meal planning by the dietitian Weight is about the same  Blood sugars reviewed from his [place] meter appears to be checking mostly fasting readings Has been doing some regular walking  Hypoglycemia:  none    Glucometer:[place]  Blood Glucose readings from download:   PRE-MEAL Fasting Lunch Dinner Bedtime Overall  Glucose range: 119-229 220 229  119-255  Mean/median: 160    167   POST-MEAL PC Breakfast PC Lunch PC Dinner  Glucose range:   217  Mean/median:       Dietician visit: Most recent: 05/13/22     Weight control:  Wt Readings from Last 3 Encounters:  03/07/23 179 lb 12.8 oz (81.6 kg)  12/24/22 177 lb 6.4 oz (80.5 kg)  12/02/22 176 lb 6.4 oz (80 kg)            Diabetes labs:  Lab Results  Component Value Date   HGBA1C 9.2 (H) 12/20/2022   HGBA1C 8.7 (H) 08/21/2022   HGBA1C 8.7  (A) 06/27/2022   Lab Results  Component Value Date   MICROALBUR 2.6 (H) 06/27/2022   CREATININE 1.11 03/04/2023     Allergies as of 03/07/2023   No Known Allergies      Medication List        Accurate as of March 07, 2023  8:48 AM. If you have any questions, ask your nurse or doctor.          aspirin EC 81 MG tablet Take 81 mg by mouth daily.   atorvastatin 40 MG tablet Commonly known as: LIPITOR Take 40 mg by mouth daily.   gabapentin 100 MG capsule Commonly known as: NEURONTIN Take 1 capsule (100 mg total) by mouth at bedtime.   glimepiride 4 MG tablet Commonly known as: AMARYL TAKE 1 TABLET BY MOUTH ONCE DAILY BEFORE SUPPER   Insulin Pen Needle 31G X 5 MM Misc Inject insulin 3-4 times daily   Jardiance 25 MG Tabs tablet Generic drug: empagliflozin Take 1 tablet by mouth once daily   lisinopril 5 MG tablet Commonly known as: ZESTRIL   metFORMIN 500 MG 24 hr tablet Commonly known as: GLUCOPHAGE-XR TAKE 4 TABLETS BY MOUTH ONCE DAILY WITH BREAKFAST   NovoLOG FlexPen 100 UNIT/ML FlexPen Generic drug: insulin aspart Inject 3  to 4 units before meals as directed   Tresiba FlexTouch 100 UNIT/ML FlexTouch Pen Generic drug: insulin degludec Inject once daily, adjust as directed with max dose 25 units        Allergies: No Known Allergies  Past Medical History:  Diagnosis Date   Diabetes ([place])    Dyslipidemia     No past surgical history on file.  Family History  Problem Relation Age of Onset   Diabetes Mother     Social History:  reports that he has quit smoking. He has never used smokeless tobacco. He reports current alcohol use. He reports that he does not use drugs.  Review of Systems:  Last diabetic eye exam date 3/23  Last foot exam date: 9/22  Symptoms of neuropathy: pins and needles in his feet and different locations, sometimes affecting his sleep He was prescribed gabapentin and does not appear to be taking it regularly  He is  having some numbness in his right hand and is waiting for rheumatology consult  Last urine microalbumin: 7/23  Hypertension:   Treatment includes lisinopril 5 mg daily  BP Readings from Last 3 Encounters:  03/07/23 132/64  12/24/22 136/68  12/02/22 120/74    Lipid management: His last LDL is 93 This is being followed by his PCP and treated with atorvastatin 40 mg daily    No results found for: "CHOL" No results found for: "HDL" No results found for: "LDLCALC" No results found for: "TRIG" No results found for: "CHOLHDL" No results found for: "LDLDIRECT"  No results found for: "ALT"   Examination:   BP 132/64 (BP Location: Left Arm, Patient Position: Sitting, Cuff Size: Normal)   Pulse 63   Ht 5\' 11"  (1.803 m)   Wt 179 lb 12.8 oz (81.6 kg)   SpO2 98%   BMI 25.08 kg/m   Body mass index is 25.08 kg/m.     ASSESSMENT/ PLAN:    Diabetes type 2:   Current regimen: Amaryl, metformin, RYBELSUS, Januvia and Jardiance  See history of present illness for detailed discussion of current diabetes management, blood sugar patterns and problems identified  A1c is up to 9.2  Blood glucose control is likely worse because of insulin deficiency He could not tolerate more than a small dose of Rybelsus Considering his duration of diabetes and BMI under 25 likely needs to be starting on insulin Also needs reevaluation of his diet and regular exercise program  Blood sugar monitoring is somewhat difficult to interpret because of using [place] meter   Recommendations:   Start basal insulin using [place] Discussed how basal insulin works, timing of injection, dosage, injection sites.  Also discussed titration based on fasting blood sugar every 3 days by 2 units and at target of 90-130 for fasting reading.  Given a flowsheet with instructions on how to keep a record and adjust the doses He was also instructed in detail by the diabetes educator on the injection technique and how to  follow the titration schedule He will let us know if [place] Aas is not covered He can stop Januvia as this is unlikely to be helping If blood sugars are higher than target consistently after meals may consider mealtime insulin and stopping Amaryl Continue Jardiance and metformin Follow in about 6 to 8 weeks, in the meantime he can send [place] an update every 2 to 3 weeks on his blood sugars  Diabetic neuropathy: Has been prescribed gabapentin   There are no Patient Instructions on file for this visit.  Total visit time for evaluation and management and counseling = 30 minutes  Elayne Snare 03/07/2023, 8:48 AM

## 2023-03-11 ENCOUNTER — Other Ambulatory Visit: Payer: Self-pay | Admitting: Endocrinology

## 2023-03-12 DIAGNOSIS — Z0000 Encounter for general adult medical examination without abnormal findings: Secondary | ICD-10-CM | POA: Diagnosis not present

## 2023-03-12 DIAGNOSIS — E782 Mixed hyperlipidemia: Secondary | ICD-10-CM | POA: Diagnosis not present

## 2023-03-12 DIAGNOSIS — I10 Essential (primary) hypertension: Secondary | ICD-10-CM | POA: Diagnosis not present

## 2023-03-12 DIAGNOSIS — Z7185 Encounter for immunization safety counseling: Secondary | ICD-10-CM | POA: Diagnosis not present

## 2023-03-12 DIAGNOSIS — I700 Atherosclerosis of aorta: Secondary | ICD-10-CM | POA: Diagnosis not present

## 2023-03-12 DIAGNOSIS — Z1211 Encounter for screening for malignant neoplasm of colon: Secondary | ICD-10-CM | POA: Diagnosis not present

## 2023-03-12 DIAGNOSIS — E1142 Type 2 diabetes mellitus with diabetic polyneuropathy: Secondary | ICD-10-CM | POA: Diagnosis not present

## 2023-03-12 DIAGNOSIS — G4733 Obstructive sleep apnea (adult) (pediatric): Secondary | ICD-10-CM | POA: Diagnosis not present

## 2023-03-17 ENCOUNTER — Other Ambulatory Visit: Payer: Self-pay | Admitting: Endocrinology

## 2023-04-08 DIAGNOSIS — E1142 Type 2 diabetes mellitus with diabetic polyneuropathy: Secondary | ICD-10-CM | POA: Diagnosis not present

## 2023-04-08 DIAGNOSIS — G4733 Obstructive sleep apnea (adult) (pediatric): Secondary | ICD-10-CM | POA: Diagnosis not present

## 2023-04-08 DIAGNOSIS — I10 Essential (primary) hypertension: Secondary | ICD-10-CM | POA: Diagnosis not present

## 2023-04-13 ENCOUNTER — Other Ambulatory Visit: Payer: Self-pay | Admitting: Endocrinology

## 2023-04-14 ENCOUNTER — Other Ambulatory Visit: Payer: Self-pay | Admitting: Endocrinology

## 2023-04-30 ENCOUNTER — Other Ambulatory Visit: Payer: Self-pay | Admitting: Endocrinology

## 2023-05-02 DIAGNOSIS — L820 Inflamed seborrheic keratosis: Secondary | ICD-10-CM | POA: Diagnosis not present

## 2023-05-02 DIAGNOSIS — D225 Melanocytic nevi of trunk: Secondary | ICD-10-CM | POA: Diagnosis not present

## 2023-05-02 DIAGNOSIS — Z789 Other specified health status: Secondary | ICD-10-CM | POA: Diagnosis not present

## 2023-05-02 DIAGNOSIS — C44329 Squamous cell carcinoma of skin of other parts of face: Secondary | ICD-10-CM | POA: Diagnosis not present

## 2023-05-02 DIAGNOSIS — L814 Other melanin hyperpigmentation: Secondary | ICD-10-CM | POA: Diagnosis not present

## 2023-05-02 DIAGNOSIS — L821 Other seborrheic keratosis: Secondary | ICD-10-CM | POA: Diagnosis not present

## 2023-05-02 DIAGNOSIS — L298 Other pruritus: Secondary | ICD-10-CM | POA: Diagnosis not present

## 2023-05-02 DIAGNOSIS — R208 Other disturbances of skin sensation: Secondary | ICD-10-CM | POA: Diagnosis not present

## 2023-05-02 DIAGNOSIS — D485 Neoplasm of uncertain behavior of skin: Secondary | ICD-10-CM | POA: Diagnosis not present

## 2023-05-02 DIAGNOSIS — L538 Other specified erythematous conditions: Secondary | ICD-10-CM | POA: Diagnosis not present

## 2023-05-07 ENCOUNTER — Other Ambulatory Visit (INDEPENDENT_AMBULATORY_CARE_PROVIDER_SITE_OTHER): Payer: Medicare Other

## 2023-05-07 DIAGNOSIS — E1165 Type 2 diabetes mellitus with hyperglycemia: Secondary | ICD-10-CM

## 2023-05-07 LAB — HEMOGLOBIN A1C: Hgb A1c MFr Bld: 8 % — ABNORMAL HIGH (ref 4.6–6.5)

## 2023-05-07 LAB — BASIC METABOLIC PANEL
BUN: 19 mg/dL (ref 6–23)
CO2: 25 mEq/L (ref 19–32)
Calcium: 9.6 mg/dL (ref 8.4–10.5)
Chloride: 104 mEq/L (ref 96–112)
Creatinine, Ser: 1.11 mg/dL (ref 0.40–1.50)
GFR: 65.51 mL/min (ref >60.00)
Glucose, Bld: 146 mg/dL — ABNORMAL HIGH (ref 70–99)
Potassium: 4.5 mEq/L (ref 3.5–5.1)
Sodium: 139 mEq/L (ref 135–145)

## 2023-05-12 ENCOUNTER — Other Ambulatory Visit: Payer: Self-pay | Admitting: Endocrinology

## 2023-05-13 ENCOUNTER — Encounter: Payer: Self-pay | Admitting: Endocrinology

## 2023-05-13 ENCOUNTER — Ambulatory Visit (INDEPENDENT_AMBULATORY_CARE_PROVIDER_SITE_OTHER): Payer: Medicare Other | Admitting: Endocrinology

## 2023-05-13 VITALS — BP 114/70 | HR 55 | Ht 71.0 in | Wt 185.0 lb

## 2023-05-13 DIAGNOSIS — Z794 Long term (current) use of insulin: Secondary | ICD-10-CM | POA: Diagnosis not present

## 2023-05-13 DIAGNOSIS — E1142 Type 2 diabetes mellitus with diabetic polyneuropathy: Secondary | ICD-10-CM

## 2023-05-13 NOTE — Patient Instructions (Signed)
Leave off Glimeperide  Novolog before each meal with Carbs

## 2023-05-13 NOTE — Progress Notes (Signed)
Patient ID: Nathan Duncan, male   DOB: [birth year], 72 y.o.   MRN: [id number]           Reason for Appointment: Type II Diabetes follow-up   History of Present Illness   Diagnosis date: 1985  Previous history:  Non-insulin hypoglycemic drugs previously used: Metformin, Januvia, Amaryl, Invokana, Rybelsus Actos was started in June 2022 He has never been on insulin  A1c range in the last few years is: 7.4-8.9  Recent history:     Non-insulin hypoglycemic drugs: metformin 2 g daily, Jardiance 25 mg, Amaryl 4 mg at dinner        INSULIN regimen: Basal insulin 5 units Tresiba daily Mealtime insulin NovoLog 4-5 units as needed  Side effects from medications: Nausea from Rybelsus  Current self management, blood sugar patterns and problems identified:  A1c was last higher, 9.2 and now 8% Although he was told to try taking NovoLog most of his meals he is mostly taking it when he is eating a relatively large meal when he takes his blood sugars might go up Previously his blood sugars were sporadically over 200 at any given meal including breakfast and he was monitoring regularly He is now only checking fasting blood sugars which are listed below However with only 5 units of Tresiba his fasting blood sugars are mostly fairly good but somewhat higher overall than before He generally eats some protein or relatively high fat meal at breakfast but always carbohydrate  He was recommended the freestyle libre sensor on the last visit and was able to review the pamphlet but is afraid of the needle Except for doing some yard work is not doing any formal exercise and not appears to be motivated for this He has previously been instructed on meal planning by the dietitian Weight is up more significantly at this time  Hypoglycemia:  none    Glucometer:[place]  Blood Glucose readings from monitor data:   PRE-MEAL Premeal Lunch Dinner Bedtime Overall  Glucose range: 99-163      Mean/median:      120   POST-MEAL PC meals PC Lunch PC Dinner  Glucose range: 92-285 previously    Mean/median:       Previously: BEFORE meals average 108, 30-day range 92-132 AFTER meals average 191, RANGE 143-258   Dietician visit: Most recent: 05/13/22     Weight control:  Wt Readings from Last 3 Encounters:  05/13/23 185 lb (83.9 kg)  03/07/23 179 lb 12.8 oz (81.6 kg)  12/24/22 177 lb 6.4 oz (80.5 kg)            Diabetes labs:  Lab Results  Component Value Date   HGBA1C 8.0 (H) 05/07/2023   HGBA1C 9.2 (H) 12/20/2022   HGBA1C 8.7 (H) 08/21/2022   Lab Results  Component Value Date   MICROALBUR 2.6 (H) 06/27/2022   CREATININE 1.11 05/07/2023     Allergies as of 05/13/2023   No Known Allergies      Medication List        Accurate as of May 13, 2023  9:53 AM. If you have any questions, ask your nurse or doctor.          aspirin EC 81 MG tablet Take 81 mg by mouth daily.   atorvastatin 40 MG tablet Commonly known as: LIPITOR Take 40 mg by mouth daily.   B-D UF III MINI PEN NEEDLES 31G X 5 MM Misc Generic drug: Insulin Pen Needle USE WITH INSULIN PEN   gabapentin 100  MG capsule Commonly known as: NEURONTIN Take 1 capsule by mouth at bedtime   glimepiride 4 MG tablet Commonly known as: AMARYL TAKE 1 TABLET BY MOUTH ONCE DAILY BEFORE SUPPER   Jardiance 25 MG Tabs tablet Generic drug: empagliflozin Take 1 tablet by mouth once daily   lisinopril 5 MG tablet Commonly known as: ZESTRIL   metFORMIN 500 MG 24 hr tablet Commonly known as: GLUCOPHAGE-XR TAKE 4 TABLETS BY MOUTH ONCE DAILY WITH BREAKFAST   NovoLOG FlexPen 100 UNIT/ML FlexPen Generic drug: insulin aspart Inject 3 to 4 units before meals as directed   Tresiba FlexTouch 100 UNIT/ML FlexTouch Pen Generic drug: insulin degludec Inject once daily, adjust as directed with max dose 25 units What changed:  how much to take when to take this        Allergies: No Known Allergies  Past Medical  History:  Diagnosis Date   Diabetes (HCC)    Dyslipidemia     No past surgical history on file.  Family History  Problem Relation Age of Onset   Diabetes Mother     Social History:  reports that he has quit smoking. He has never used smokeless tobacco. He reports current alcohol use. He reports that he does not use drugs.  Review of Systems:  Last diabetic eye exam date 3/23  Last foot exam date: 9/22  Symptoms of neuropathy: pins and needles in his feet and different locations, sometimes affecting his sleep He was prescribed gabapentin by neurologist, likely taking this as needed  Last urine microalbumin: 7/23  Hypertension:   Treatment includes lisinopril 5 mg daily  BP Readings from Last 3 Encounters:  05/13/23 114/70  03/07/23 132/64  12/24/22 136/68    Lipid management: His last LDL is 93 This has been followed by his PCP and treated with atorvastatin 40 mg daily    No results found for: "CHOL" No results found for: "HDL" No results found for: "LDLCALC" No results found for: "TRIG" No results found for: "CHOLHDL" No results found for: "LDLDIRECT"  No results found for: "ALT"   Examination:   BP 114/70 (BP Location: Right Arm, Patient Position: Sitting, Cuff Size: Small)   Pulse (!) 55   Ht 5\' 11"  (1.803 m)   Wt 185 lb (83.9 kg)   SpO2 98%   BMI 25.80 kg/m   Body mass index is 25.8 kg/m.   Diabetic Foot Exam - Simple   Simple Foot Form Diabetic Foot exam was performed with the following findings: Yes   Visual Inspection No deformities, no ulcerations, no other skin breakdown bilaterally: Yes Sensation Testing See comments: Yes Pulse Check Posterior Tibialis and Dorsalis pulse intact bilaterally: Yes Comments Absent monofilament sensation on the right first 3 toes and decreased on the fourth and fifth.  Slightly decreased on the left toes but normal on the plantar surfaces     ASSESSMENT/ PLAN:    Diabetes type 2:   Current regimen: Basal  bolus insulin, Amaryl, metformin and Jardiance  See history of present illness for detailed discussion of current diabetes management, blood sugar patterns and problems identified  A1c was 8 compared to 9.2  Blood glucose control is is improving but not sufficiently He is not checking readings after meals and not taking NovoLog unless eating a relatively large meal and not consistently every day Fasting readings are variable but generally controlled with 5 units of Tresiba Previously has seen nutritionist   Recommendations:  Continue 5 units of Tresiba unless blood sugars get  out of range Try to stop Amaryl to see if it makes any difference and if has mild increase in morning sugars he can go up 1 to 2 units on Tresiba Make sure he takes his blood sugars after meals consistently He will need to take NovoLog for every meal that has any carbohydrate and likely 6 units if eating biscuits Walking or other regular exercise Start using freestyle libre and he will let us know if he needs any help starting this, offered him to use a sample to see how it feels but he is okay with starting this on his own  For this prescription will be sent through parachute website  NEUROPATHY: Followed by neurologist, has only mild sensory loss Needs to check feet daily  Patient Instructions  Leave off Glimeperide  Novolog before each meal with Carbs  Total visit time for evaluation and management and counseling = 30 minutes  Kaymen Adrian 05/13/2023, 9:53 AM

## 2023-05-14 ENCOUNTER — Telehealth: Payer: Self-pay | Admitting: Endocrinology

## 2023-05-14 DIAGNOSIS — D0439 Carcinoma in situ of skin of other parts of face: Secondary | ICD-10-CM | POA: Diagnosis not present

## 2023-05-14 NOTE — Telephone Encounter (Signed)
Please order freestyle libre 3 reader and sensor through parachute website

## 2023-05-15 NOTE — Telephone Encounter (Signed)
Order has been placed on parachute

## 2023-05-15 NOTE — Telephone Encounter (Signed)
I have not received the order for signature

## 2023-05-25 ENCOUNTER — Encounter: Payer: Self-pay | Admitting: Endocrinology

## 2023-06-05 ENCOUNTER — Other Ambulatory Visit: Payer: Self-pay | Admitting: Endocrinology

## 2023-06-10 DIAGNOSIS — Z86007 Personal history of in-situ neoplasm of skin: Secondary | ICD-10-CM | POA: Diagnosis not present

## 2023-06-10 DIAGNOSIS — L244 Irritant contact dermatitis due to drugs in contact with skin: Secondary | ICD-10-CM | POA: Diagnosis not present

## 2023-06-10 DIAGNOSIS — Z08 Encounter for follow-up examination after completed treatment for malignant neoplasm: Secondary | ICD-10-CM | POA: Diagnosis not present

## 2023-06-16 ENCOUNTER — Other Ambulatory Visit: Payer: Self-pay | Admitting: Endocrinology

## 2023-06-18 ENCOUNTER — Other Ambulatory Visit: Payer: Self-pay | Admitting: Endocrinology

## 2023-07-02 ENCOUNTER — Telehealth: Payer: Self-pay

## 2023-07-02 ENCOUNTER — Encounter: Payer: Self-pay | Admitting: Endocrinology

## 2023-07-02 DIAGNOSIS — E1142 Type 2 diabetes mellitus with diabetic polyneuropathy: Secondary | ICD-10-CM

## 2023-07-02 MED ORDER — BD PEN NEEDLE MINI U/F 31G X 5 MM MISC
5 refills | Status: DC
Start: 2023-07-02 — End: 2023-07-03

## 2023-07-02 NOTE — Telephone Encounter (Signed)
Toshio Slusher Ann Andrea Ferrer, CMA  ?

## 2023-07-03 ENCOUNTER — Telehealth: Payer: Self-pay

## 2023-07-03 DIAGNOSIS — E1142 Type 2 diabetes mellitus with diabetic polyneuropathy: Secondary | ICD-10-CM

## 2023-07-03 MED ORDER — BD PEN NEEDLE MINI U/F 31G X 5 MM MISC
5 refills | Status: DC
Start: 2023-07-03 — End: 2024-08-06

## 2023-07-03 NOTE — Telephone Encounter (Signed)
Nathan Duncan, CMA  ?

## 2023-07-08 DIAGNOSIS — E1142 Type 2 diabetes mellitus with diabetic polyneuropathy: Secondary | ICD-10-CM | POA: Diagnosis not present

## 2023-07-08 DIAGNOSIS — G4733 Obstructive sleep apnea (adult) (pediatric): Secondary | ICD-10-CM | POA: Diagnosis not present

## 2023-07-08 DIAGNOSIS — I10 Essential (primary) hypertension: Secondary | ICD-10-CM | POA: Diagnosis not present

## 2023-07-12 ENCOUNTER — Other Ambulatory Visit: Payer: Self-pay | Admitting: Endocrinology

## 2023-07-15 ENCOUNTER — Other Ambulatory Visit: Payer: Self-pay | Admitting: Endocrinology

## 2023-07-15 ENCOUNTER — Other Ambulatory Visit (INDEPENDENT_AMBULATORY_CARE_PROVIDER_SITE_OTHER): Payer: Medicare Other

## 2023-07-15 DIAGNOSIS — E1142 Type 2 diabetes mellitus with diabetic polyneuropathy: Secondary | ICD-10-CM

## 2023-07-15 LAB — BASIC METABOLIC PANEL
BUN: 28 mg/dL — ABNORMAL HIGH (ref 6–23)
CO2: 25 mEq/L (ref 19–32)
Calcium: 9.6 mg/dL (ref 8.4–10.5)
Chloride: 104 mEq/L (ref 96–112)
Creatinine, Ser: 1.12 mg/dL (ref 0.40–1.50)
GFR: 64.72 mL/min (ref >60.00)
Glucose, Bld: 133 mg/dL — ABNORMAL HIGH (ref 70–99)
Potassium: 5.3 mEq/L — ABNORMAL HIGH (ref 3.5–5.1)
Sodium: 138 mEq/L (ref 135–145)

## 2023-07-15 LAB — MICROALBUMIN / CREATININE URINE RATIO
Creatinine,U: 31.8 mg/dL
Microalb Creat Ratio: 2.2 mg/g (ref 0.0–30.0)
Microalb, Ur: <0.7 mg/dL (ref 0.0–1.9)

## 2023-07-15 LAB — HEMOGLOBIN A1C: Hgb A1c MFr Bld: 7.9 % — ABNORMAL HIGH (ref 4.6–6.5)

## 2023-07-16 LAB — FRUCTOSAMINE: Fructosamine: 311 umol/L — ABNORMAL HIGH (ref 0–285)

## 2023-07-17 ENCOUNTER — Ambulatory Visit (INDEPENDENT_AMBULATORY_CARE_PROVIDER_SITE_OTHER): Payer: Medicare Other | Admitting: Endocrinology

## 2023-07-17 ENCOUNTER — Encounter: Payer: Self-pay | Admitting: Endocrinology

## 2023-07-17 VITALS — BP 120/80 | HR 55 | Ht 71.0 in | Wt 186.6 lb

## 2023-07-17 DIAGNOSIS — Z794 Long term (current) use of insulin: Secondary | ICD-10-CM

## 2023-07-17 DIAGNOSIS — E1165 Type 2 diabetes mellitus with hyperglycemia: Secondary | ICD-10-CM | POA: Diagnosis not present

## 2023-07-17 DIAGNOSIS — Z7984 Long term (current) use of oral hypoglycemic drugs: Secondary | ICD-10-CM | POA: Diagnosis not present

## 2023-07-17 DIAGNOSIS — E875 Hyperkalemia: Secondary | ICD-10-CM

## 2023-07-17 LAB — POTASSIUM: Potassium: 4.6 mEq/L (ref 3.5–5.1)

## 2023-07-17 NOTE — Progress Notes (Signed)
Patient ID: Nathan Duncan, male   DOB: [birth year], 72 y.o.   MRN: [id number]           Reason for Appointment: Type II Diabetes follow-up   History of Present Illness   Diagnosis date: 1985  Previous history:  Non-insulin hypoglycemic drugs previously used: Metformin, Januvia, Amaryl, Invokana, Rybelsus Actos was started in June 2022 He has never been on insulin  A1c range in the last few years is: 7.4-8.9  Recent history:     Non-insulin hypoglycemic drugs: metformin 2 g daily, Jardiance 25 mg, Amaryl 2 mg in a.m.    INSULIN regimen: Basal insulin 5 units Tresiba daily Mealtime insulin NovoLog 8-10 units at lunch and dinner  Side effects from medications: Nausea from Rybelsus  Current self management, blood sugar patterns and problems identified:  A1c is about the same at 7.9 Fructosamine 311  He started using the freestyle libre sensor last month and has found this useful Also he has progressively increased his mealtime doses with fairly good control of postprandial readings with some exceptions He is occasionally concerned about his blood sugars going above target Most of the time he is able to remember to take his insulin before eating but occasionally late Also when eating out he will take his insulin before leaving home With occasional larger meals his blood sugars will still go up to nearly 300 even with 10 units of NovoLog He is charting his insulin doses and the libre Still taking low-dose Evaristo Bury He was asked to leave off his glimepiride but he thinks this caused his sugars to be higher Except for doing some yard work is not doing any formal exercise despite reminders He has previously been instructed on meal planning by the dietitian Weight is about the same  Analysis of the freestyle [place] data for the last 2 weeks shows the following OVERNIGHT blood sugars are the lowest around 5 AM and then progressively rising, no hypoglycemia POSTPRANDIAL readings  after breakfast are not rising significantly compared to Premeal readings After lunch and dinner also blood sugars are generally evenly controlled with only occasional spikes at different meals most significantly yesterday late afternoon with glucose 300 Blood sugars usually are averaging relatively high Premeal at lunch and dinner No hypoglycemia  CGM use % of time 96  2-week average/GV 147/26  Time in range        % 82  % Time Above 180 17  % Time above 250 1  % Time Below 70      PRE-MEAL Fasting Lunch Dinner Bedtime Overall  Glucose range:       Averages: 123        POST-MEAL PC Breakfast PC Lunch PC Dinner  Glucose range:     Averages: 172 170 168       Previously  PRE-MEAL Premeal Lunch Dinner Bedtime Overall  Glucose range: 99-163      Mean/median:     120   POST-MEAL PC meals PC Lunch PC Dinner  Glucose range: 92-285 previously    Mean/median:       Dietician visit: Most recent: 05/13/22     Weight control:  Wt Readings from Last 3 Encounters:  07/17/23 186 lb 9.6 oz (84.6 kg)  05/13/23 185 lb (83.9 kg)  03/07/23 179 lb 12.8 oz (81.6 kg)            Diabetes labs:  Lab Results  Component Value Date   HGBA1C 7.9 (H) 07/15/2023   HGBA1C 8.0 (H)  05/07/2023   HGBA1C 9.2 (H) 12/20/2022   Lab Results  Component Value Date   MICROALBUR <0.7 07/15/2023   CREATININE 1.12 07/15/2023     Allergies as of 07/17/2023   No Known Allergies      Medication List        Accurate as of July 17, 2023  8:37 AM. If you have any questions, ask your nurse or doctor.          aspirin EC 81 MG tablet Take 81 mg by mouth daily.   atorvastatin 40 MG tablet Commonly known as: LIPITOR Take 40 mg by mouth daily.   B-D UF III MINI PEN NEEDLES 31G X 5 MM Misc Generic drug: Insulin Pen Needle USE WITH INSULIN PEN/uses 3 to 4 a day   gabapentin 100 MG capsule Commonly known as: NEURONTIN Take 1 capsule by mouth at bedtime   glimepiride 4 MG tablet Commonly  known as: AMARYL TAKE 1 TABLET BY MOUTH ONCE DAILY BEFORE SUPPER   Jardiance 25 MG Tabs tablet Generic drug: empagliflozin Take 1 tablet by mouth once daily   lisinopril 5 MG tablet Commonly known as: ZESTRIL   metFORMIN 500 MG 24 hr tablet Commonly known as: GLUCOPHAGE-XR TAKE 4 TABLETS BY MOUTH ONCE DAILY WITH BREAKFAST What changed: See the new instructions.   NovoLOG FlexPen 100 UNIT/ML FlexPen Generic drug: insulin aspart Inject 3 to 4 units before meals as directed   Tresiba FlexTouch 100 UNIT/ML FlexTouch Pen Generic drug: insulin degludec Inject once daily, adjust as directed with max dose 25 units What changed:  how much to take when to take this        Allergies: No Known Allergies  Past Medical History:  Diagnosis Date   Diabetes (HCC)    Dyslipidemia     No past surgical history on file.  Family History  Problem Relation Age of Onset   Diabetes Mother     Social History:  reports that he has quit smoking. He has never used smokeless tobacco. He reports current alcohol use. He reports that he does not use drugs.  Review of Systems:  Last diabetic eye exam date 3/23  Last foot exam date: 9/22  Symptoms of neuropathy: pins and needles in his feet and different locations, sometimes affecting his sleep He was prescribed gabapentin by neurologist, likely taking this as needed  Last urine microalbumin: 06/2023  Hypertension:   Treatment includes lisinopril 5 mg daily  BP Readings from Last 3 Encounters:  07/17/23 120/80  05/13/23 114/70  03/07/23 132/64   Potassium is higher now without change in renal function  Lab Results  Component Value Date   NA 138 07/15/2023   CL 104 07/15/2023   K 5.3 (H) 07/15/2023   CO2 25 07/15/2023   BUN 28 (H) 07/15/2023   CREATININE 1.12 07/15/2023   GFR 64.72 07/15/2023   CALCIUM 9.6 07/15/2023   GLUCOSE 133 (H) 07/15/2023    Lipid management: His last LDL is 93 This has been followed by his PCP and  treated with atorvastatin 40 mg daily    No results found for: "CHOL" No results found for: "HDL" No results found for: "LDLCALC" No results found for: "TRIG" No results found for: "CHOLHDL" No results found for: "LDLDIRECT"  No results found for: "ALT"   Examination:   BP 120/80   Pulse (!) 55   Ht 5\' 11"  (1.803 m)   Wt 186 lb 9.6 oz (84.6 kg)   SpO2 99%  BMI 26.03 kg/m   Body mass index is 26.03 kg/m.    ASSESSMENT/ PLAN:    Diabetes type 2:   Current regimen: Basal bolus insulin, Amaryl, metformin and Jardiance  See history of present illness for detailed discussion of current diabetes management, blood sugar patterns and problems identified  A1c 7.9  Blood glucose control is is improving especially with going up on mealtime insulin Currently only requiring very small doses of Tresiba 5 units He says he can do better with his diet and will still have some occasional postprandial spikes Still not exercising He does have a significant dawn phenomenon at times but also rise in blood sugar in the morning with coffee Currently has 82% of readings within target range on his libre sensor which he is finding useful  Recommendations:  Continue 5 units of Tresiba unless waking up blood sugars get out of range Continue Amaryl in the morning To take insulin with him when he is eating out He was offered the [employer] device but he does not want to do this Continue Jardiance and metformin He will need to take NovoLog for every meal that has any carbohydrate and likely 6 units if eating biscuits Walking or other form of aerobic exercise several times a week Try to take NovoLog consistently and may take To 14 or 15 units for larger meals with more carbohydrate  Recheck potassium today  There are no Patient Instructions on file for this visit.  Total visit time for evaluation and management and counseling = 30 minutes  Reather Littler 07/17/2023, 8:37 AM

## 2023-07-18 ENCOUNTER — Encounter: Payer: Self-pay | Admitting: Endocrinology

## 2023-07-21 DIAGNOSIS — I10 Essential (primary) hypertension: Secondary | ICD-10-CM | POA: Diagnosis not present

## 2023-07-21 DIAGNOSIS — R063 Periodic breathing: Secondary | ICD-10-CM | POA: Diagnosis not present

## 2023-07-21 DIAGNOSIS — E1142 Type 2 diabetes mellitus with diabetic polyneuropathy: Secondary | ICD-10-CM | POA: Diagnosis not present

## 2023-07-21 DIAGNOSIS — G4730 Sleep apnea, unspecified: Secondary | ICD-10-CM | POA: Diagnosis not present

## 2023-08-04 DIAGNOSIS — H43823 Vitreomacular adhesion, bilateral: Secondary | ICD-10-CM | POA: Diagnosis not present

## 2023-08-04 DIAGNOSIS — H35372 Puckering of macula, left eye: Secondary | ICD-10-CM | POA: Diagnosis not present

## 2023-08-04 DIAGNOSIS — E113313 Type 2 diabetes mellitus with moderate nonproliferative diabetic retinopathy with macular edema, bilateral: Secondary | ICD-10-CM | POA: Diagnosis not present

## 2023-08-04 DIAGNOSIS — H2513 Age-related nuclear cataract, bilateral: Secondary | ICD-10-CM | POA: Diagnosis not present

## 2023-08-09 ENCOUNTER — Other Ambulatory Visit: Payer: Self-pay | Admitting: Endocrinology

## 2023-08-26 ENCOUNTER — Other Ambulatory Visit: Payer: Self-pay | Admitting: Endocrinology

## 2023-09-22 ENCOUNTER — Other Ambulatory Visit: Payer: Self-pay | Admitting: Endocrinology

## 2023-09-22 DIAGNOSIS — Z23 Encounter for immunization: Secondary | ICD-10-CM | POA: Diagnosis not present

## 2023-09-22 NOTE — Telephone Encounter (Signed)
Gabapentin refill request complete.

## 2023-09-23 ENCOUNTER — Other Ambulatory Visit: Payer: Self-pay

## 2023-09-23 MED ORDER — EMPAGLIFLOZIN 25 MG PO TABS: 25.0000 mg | ORAL_TABLET | Freq: Every day | ORAL | 0 refills | Status: DC

## 2023-09-29 ENCOUNTER — Other Ambulatory Visit (INDEPENDENT_AMBULATORY_CARE_PROVIDER_SITE_OTHER): Payer: Medicare Other

## 2023-09-29 DIAGNOSIS — E1165 Type 2 diabetes mellitus with hyperglycemia: Secondary | ICD-10-CM | POA: Diagnosis not present

## 2023-09-29 LAB — BASIC METABOLIC PANEL
BUN: 25 mg/dL — ABNORMAL HIGH (ref 6–23)
CO2: 24 meq/L (ref 19–32)
Calcium: 9.8 mg/dL (ref 8.4–10.5)
Chloride: 105 meq/L (ref 96–112)
Creatinine, Ser: 1.06 mg/dL (ref 0.40–1.50)
GFR: 69.04 mL/min (ref >60.00)
Glucose, Bld: 146 mg/dL — ABNORMAL HIGH (ref 70–99)
Potassium: 4.7 meq/L (ref 3.5–5.1)
Sodium: 140 meq/L (ref 135–145)

## 2023-09-29 LAB — HEMOGLOBIN A1C: Hgb A1c MFr Bld: 7.5 % — ABNORMAL HIGH (ref 4.6–6.5)

## 2023-10-01 ENCOUNTER — Other Ambulatory Visit: Payer: Medicare Other

## 2023-10-01 ENCOUNTER — Ambulatory Visit: Payer: Medicare Other | Admitting: Endocrinology

## 2023-10-01 ENCOUNTER — Encounter: Payer: Self-pay | Admitting: Endocrinology

## 2023-10-01 VITALS — BP 120/70 | HR 56 | Ht 71.0 in | Wt 190.4 lb

## 2023-10-01 DIAGNOSIS — E1169 Type 2 diabetes mellitus with other specified complication: Secondary | ICD-10-CM | POA: Diagnosis not present

## 2023-10-01 DIAGNOSIS — Z794 Long term (current) use of insulin: Secondary | ICD-10-CM | POA: Diagnosis not present

## 2023-10-01 MED ORDER — GLIMEPIRIDE 2 MG PO TABS: 2.0000 mg | ORAL_TABLET | Freq: Every day | ORAL | 4 refills | Status: DC

## 2023-10-01 NOTE — Patient Instructions (Addendum)
Diabetes regimen: Tresiba /  Degludec 8 units daily in the morning / fixed dose.  Novlog 6-10 units with meals three times a day, adjust based on meals size. Continue jardiance 25mg  daily. Metfromin XR 500 mg two times a day.  Decrease glimepiride to 2mg  daily.

## 2023-10-01 NOTE — Progress Notes (Signed)
Outpatient Endocrinology Note Nathan Leniya Breit, MD  10/01/23  Patient's Name: Nathan Duncan    DOB: Feb 14, 1951    MRN: [id number]                                                    REASON OF VISIT: Follow up for type 2 diabetes mellitus  PCP: Koren Shiver, DO  HISTORY OF PRESENT ILLNESS:   Nathan Duncan is a 72 y.o. old male with past medical history listed below, is here for follow up of type 2 diabetes mellitus.   Pertinent Diabetes History: Patient was diagnosed with type 2 diabetes mellitus in 1985.  Chronic Diabetes Complications : Retinopathy: no. Last ophthalmology exam was done on annually, reportedly. Nephropathy: no, on lisinopril. Peripheral neuropathy: yes numbness and tingling of the feet.  On gabapentin. Coronary artery disease: no Stroke: no  Relevant comorbidities and cardiovascular risk factors: Obesity: no Body mass index is 26.56 kg/m.  Hypertension: yes Hyperlipidemia. Yes, on statin.   Current / Home Diabetic regimen includes: Tresiba 8-12 units at supper time.  Novolog 8-12 units with breakfast and lunch, not with supper. Jardiance 25 mg daily. Metformin XR 500 mg two times a day with meals.  Glimepiride 4 mg daily.  Prior diabetic medications: Januvia, Rybelsus, Actos, Invokana in the past.  Glycemic data:    CONTINUOUS GLUCOSE MONITORING SYSTEM (CGMS) INTERPRETATION: At today's visit, we reviewed CGM downloads. The full report is scanned in the media. Reviewing the CGM trends, blood glucose are as follows:  FreeStyle Libre 3 CGM-  Sensor Download (Sensor download was reviewed and summarized below.) Dates: September 26 to October 01, 2023 for 14 days Sensor Average: 150  Glucose Management Indicator: 6.9% Glucose Variability: 28.6%  % data captured: 96%  Glycemic Trends:  <54: 0% 54-70: 0% 71-180: 77% 181-250: 20% 251-400: 3%   Interpretation: -Mostly acceptable blood sugar with occasional hyperglycemia mainly related  with supper with blood sugar up to 250s.  Occasional mild hyperglycemia with breakfast and lunch as well, likely related with timing of NovoLog.  Occasional hypoglycemia with blood sugar in 60s especially late afternoon.  Overnight blood sugar mostly acceptable with at times running in the low normal range.  No significant hypoglycemia.  Hypoglycemia: Patient has minor hypoglycemic episodes. Patient has hypoglycemia awareness.  Factors modifying glucose control: 1.  Diabetic diet assessment: 3 meals a day.  2.  Staying active or exercising: No formal exercise.  Some yard work.  3.  Medication compliance: compliant all of the time.  Interval history 10/01/23 CGM data as reviewed above.  He has complaints of numbness of the feet otherwise no complaints today.  Hemoglobin A1c 7.5% improving.  GMI on CGM 6.9%.  REVIEW OF SYSTEMS As per history of present illness.   PAST MEDICAL HISTORY: Past Medical History:  Diagnosis Date   Diabetes (HCC)    Dyslipidemia     PAST SURGICAL HISTORY: History reviewed. No pertinent surgical history.  ALLERGIES: No Known Allergies  FAMILY HISTORY:  Family History  Problem Relation Age of Onset   Diabetes Mother     SOCIAL HISTORY: Social History   Socioeconomic History   Marital status: Married    Spouse name: Not on file   Number of children: Not on file   Years of education: Not on file   Highest education  level: Not on file  Occupational History   Not on file  Tobacco Use   Smoking status: Former   Smokeless tobacco: Never  Substance and Sexual Activity   Alcohol use: Yes    Comment: occ or 1 per month   Drug use: Never   Sexual activity: Not on file  Other Topics Concern   Not on file  Social History Narrative   Not on file   Social Determinants of Health   Financial Resource Strain: Not on file  Food Insecurity: Not on file  Transportation Needs: Not on file  Physical Activity: Not on file  Stress: Not on file  Social  Connections: Not on file    MEDICATIONS:  Current Outpatient Medications  Medication Sig Dispense Refill   aspirin EC 81 MG tablet Take 81 mg by mouth daily.     atorvastatin (LIPITOR) 40 MG tablet Take 40 mg by mouth daily.      empagliflozin (JARDIANCE) 25 MG TABS tablet Take 1 tablet (25 mg total) by mouth daily. 90 tablet 0   gabapentin (NEURONTIN) 100 MG capsule Take 1 capsule by mouth at bedtime 30 capsule 0   insulin aspart (NOVOLOG FLEXPEN) 100 UNIT/ML FlexPen Inject 3 to 4 units before meals as directed (Patient taking differently: Inject 10 Units into the skin daily. Inject 10 units before meals as directed) 15 mL 1   insulin degludec (TRESIBA FLEXTOUCH) 100 UNIT/ML FlexTouch Pen Inject once daily, adjust as directed with max dose 25 units (Patient taking differently: Inject 8 Units into the skin at bedtime. Inject once daily, adjust as directed with max dose 25 units) 15 mL 1   Insulin Pen Needle (B-D UF III MINI PEN NEEDLES) 31G X 5 MM MISC USE WITH INSULIN PEN/uses 3 to 4 a day 100 each 5   lisinopril (PRINIVIL,ZESTRIL) 5 MG tablet      metFORMIN (GLUCOPHAGE-XR) 500 MG 24 hr tablet TAKE 4 TABLETS BY MOUTH ONCE DAILY WITH BREAKFAST (Patient taking differently: TAKE 2 tablets by mouth Twice a day) 360 tablet 0   glimepiride (AMARYL) 2 MG tablet Take 1 tablet (2 mg total) by mouth daily with breakfast. 90 tablet 4   No current facility-administered medications for this visit.    PHYSICAL EXAM: Vitals:   10/01/23 0800  BP: 120/70  Pulse: (!) 56  SpO2: 99%  Weight: 190 lb 6.4 oz (86.4 kg)  Height: 5\' 11"  (1.803 m)   Body mass index is 26.56 kg/m.  Wt Readings from Last 3 Encounters:  10/01/23 190 lb 6.4 oz (86.4 kg)  07/17/23 186 lb 9.6 oz (84.6 kg)  05/13/23 185 lb (83.9 kg)    General: Well developed, well nourished male in no apparent distress.  HEENT: AT/[place], no external lesions.  Eyes: Conjunctiva clear and no icterus. Neck: Neck supple  Lungs: Respirations not  labored Neurologic: Alert, oriented, normal speech Extremities / Skin: Dry.  Psychiatric: Does not appear depressed or anxious  Diabetic Foot Exam - Simple   Simple Foot Form  10/01/2023  8:44 AM  Visual Inspection Sensation Testing Pulse Check Comments    LABS Reviewed Lab Results  Component Value Date   HGBA1C 7.5 (H) 09/29/2023   HGBA1C 7.9 (H) 07/15/2023   HGBA1C 8.0 (H) 05/07/2023   Lab Results  Component Value Date   FRUCTOSAMINE 311 (H) 07/15/2023   FRUCTOSAMINE 350 (H) 03/04/2023   No results found for: "CHOL", "HDL", "LDLCALC", "LDLDIRECT", "TRIG", "CHOLHDL" Lab Results  Component Value Date   MICRALBCREAT  2.2 07/15/2023   MICRALBCREAT 4.4 06/27/2022   Lab Results  Component Value Date   CREATININE 1.06 09/29/2023   Lab Results  Component Value Date   GFR 69.04 09/29/2023    ASSESSMENT / PLAN  1. Type 2 diabetes mellitus with other specified complication, with long-term current use of insulin (HCC)     Diabetes Mellitus type 2, complicated by diabetic neuropathy. - Diabetic status / severity: Uncontrolled, improving.  Lab Results  Component Value Date   HGBA1C 7.5 (H) 09/29/2023    - Hemoglobin A1c goal : <7%  Patient is advised to take NovoLog with supper as well.  He has been taking NovoLog only with breakfast and lunch.  Advised to stay on fixed dose of Tresiba.  Decrease glimepiride to 2 hypoglycemia in the afternoon.  - Medications: See below. Diabetes regimen: Tresiba /  Degludec 8 units daily in the morning / fixed dose.  Novlog 6-10 units with meals three times a day, adjust based on meals size. Continue jardiance 25mg  daily. Metfromin XR 500 mg two times a day.  Decrease glimepiride to 2mg  daily.   - Home glucose testing: CGM and check as needed. - Discussed/ Gave Hypoglycemia treatment plan.  # Consult : not required at this time.   # Annual urine for microalbuminuria/ creatinine ratio, no microalbuminuria currently, continue  ACE/ARB /lisinopril. Last  Lab Results  Component Value Date   MICRALBCREAT 2.2 07/15/2023    # Foot check nightly / neuropathy, continue gabapentin.  # Annual dilated diabetic eye exams.   - Diet: Make healthy diabetic food choices - Life style / activity / exercise: Discussed.  2. Blood pressure  -  BP Readings from Last 1 Encounters:  10/01/23 120/70    - Control is in target.  - No change in current plans.  3. Lipid status / Hyperlipidemia - Last No results found for: "LDLCALC" - Continue atorvastatin 40 mg daily.  Managed by primary care provider.  Diagnoses and all orders for this visit:  Type 2 diabetes mellitus with other specified complication, with long-term current use of insulin (HCC) -     Basic metabolic panel; Future -     Hemoglobin A1c; Future  Other orders -     glimepiride (AMARYL) 2 MG tablet; Take 1 tablet (2 mg total) by mouth daily with breakfast.    DISPOSITION Follow up in clinic in 3  months suggested.   All questions answered and patient verbalized understanding of the plan.  Nathan Georgia Baria, MD [facility] [address] [place], [address] [place], [place] [address] Phone # [phone]  At least part of this note was generated using voice recognition software. Inadvertent word errors may have occurred, which were not recognized during the proofreading process.

## 2023-10-02 ENCOUNTER — Ambulatory Visit: Payer: Medicare Other | Admitting: Endocrinology

## 2023-10-03 ENCOUNTER — Ambulatory Visit: Payer: Medicare Other | Admitting: Endocrinology

## 2023-10-17 ENCOUNTER — Ambulatory Visit: Payer: Medicare Other | Attending: Cardiology | Admitting: Cardiology

## 2023-10-17 ENCOUNTER — Encounter: Payer: Self-pay | Admitting: Cardiology

## 2023-10-17 VITALS — BP 132/62 | HR 58 | Ht 71.0 in | Wt 192.2 lb

## 2023-10-17 DIAGNOSIS — R063 Periodic breathing: Secondary | ICD-10-CM | POA: Insufficient documentation

## 2023-10-17 DIAGNOSIS — E785 Hyperlipidemia, unspecified: Secondary | ICD-10-CM | POA: Insufficient documentation

## 2023-10-17 DIAGNOSIS — R072 Precordial pain: Secondary | ICD-10-CM | POA: Insufficient documentation

## 2023-10-17 NOTE — Patient Instructions (Signed)
Medication Instructions:  The current medical regimen is effective;  continue present plan and medications.  *If you need a refill on your cardiac medications before your next appointment, please call your pharmacy*  Testing/Procedures: Your physician has requested that you have an echocardiogram. Echocardiography is a painless test that uses sound waves to create images of your heart. It provides your doctor with information about the size and shape of your heart and how well your heart's chambers and valves are working. This procedure takes approximately one hour. There are no restrictions for this procedure. Please do NOT wear cologne, perfume, aftershave, or lotions (deodorant is allowed). Please arrive 15 minutes prior to your appointment time.    Your cardiac CT will be scheduled at:   [facility] [address] [place], [place] [address] [phone]  Please arrive at the [facility] En[facility] (Entrance C2) of [facility] 30 minutes prior to test start time. You can use the FREE valet parking offered at entrance C (encouraged to control the heart rate for the test)  Proceed to the [facility] Radiology Department (first floor) to check-in and test prep.  All radiology patients and guests should use entrance C2 at [facility], accessed from [facility], even though the hospital's physical address listed is [address].     Please follow these instructions carefully (unless otherwise directed):  An IV will be required for this test and Nitroglycerin will be given.  Hold all erectile dysfunction medications at least 3 days (72 hrs) prior to test. (Ie viagra, cialis, sildenafil, tadalafil, etc)   On the Night Before the Test: Be sure to Drink plenty of water. Do not consume any caffeinated/decaffeinated beverages or chocolate 12 hours prior to your test. Do not take any antihistamines 12 hours prior to your  test.  On the Day of the Test: Drink plenty of water until 1 hour prior to the test. Do not eat any food 1 hour prior to test. You may take your regular medications prior to the test.  Take metoprolol (Lopressor) two hours prior to test. If you take Furosemide/Hydrochlorothiazide/Spironolactone, please HOLD on the morning of the test.      After the Test: Drink plenty of water. After receiving IV contrast, you may experience a mild flushed feeling. This is normal. On occasion, you may experience a mild rash up to 24 hours after the test. This is not dangerous. If this occurs, you can take Benadryl 25 mg and increase your fluid intake. If you experience trouble breathing, this can be serious. If it is severe call 911 IMMEDIATELY. If it is mild, please call our office. If you take any of these medications: Glipizide/Metformin, Avandament, Glucavance, please do not take 48 hours after completing test unless otherwise instructed.  We will call to schedule your test 2-4 weeks out understanding that some insurance companies will need an authorization prior to the service being performed.   For more information and frequently asked questions, please visit our website : [email or web address]  For non-scheduling related questions, please contact the cardiac imaging nurse navigator should you have any questions/concerns: Cardiac Imaging Nurse Navigators Direct Office Dial: [phone]   For scheduling needs, including cancellations and rescheduling, please call [place], [phone].   Follow-Up: At [facility], you and your health needs are our priority.  As part of our continuing mission to provide you with exceptional heart care, we have created designated Provider Care Teams.  These Care Teams  include your primary Cardiologist (physician) and Advanced Practice Providers (APPs -  Physician Assistants and Nurse Practitioners) who all work together to provide you with the  care you need, when you need it.  We recommend signing up for the patient portal called "MyChart".  Sign up information is provided on this After Visit Summary.  MyChart is used to connect with patients for Virtual Visits (Telemedicine).  Patients are able to view lab/test results, encounter notes, upcoming appointments, etc.  Non-urgent messages can be sent to your provider as well.   To learn more about what you can do with MyChart, go to [email or web address].    Your next appointment:   Follow up will be based on the results of the above testing.

## 2023-10-17 NOTE — Progress Notes (Signed)
Cardiology Office Note:  .   Date:  10/17/2023  ID:  Nathan Duncan, DOB 07-13-1951, MRN [id number] PCP: Koren Shiver, DO  [facility] Providers Cardiologist:  None     History of Present Illness: .   Nathan Duncan is a 72 y.o. male Discussed with the use of AI scribe   History of Present Illness   A 72 year old patient with a history of type 2 diabetes, hypertension, and obstructive sleep apnea presents for evaluation of a change in breathing pattern. 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 The patient is currently on aspirin 81 mg, metformin, lisinopril 5 mg for renal protection, atorvastatin 40 mg, Jardiance 25 mg, and Glimepiride. The patient is a former smoker and has aortic atherosclerosis as per a prior ultrasound report. The patient's LDL is at goal (57), but triglycerides are elevated at 320, and CRP is high at 17. The patient's TSH is normal at 1.09, hemoglobin is 13.2, and creatinine is 1.04. The patient's hemoglobin A1c is 7.9. A prior carotid doppler in 2021 showed no significant stenosis but mild plaque.  The patient reports occasional precordial discomfort, particularly after consuming salty foods. However, the patient denies experiencing this discomfort during physical activities such as walking or climbing stairs. The patient has a history of running and underwent a cardiac catheterization approximately 35 years ago, which revealed a "natural bypass." The patient ceased running after this procedure.      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  Moved about every 5 years.  Had lived in [place], [place], now back in [place] with his son  Family History - Father died of cancer, heavy smoker - Mother had diabetes and some heart conditions      Studies Reviewed: Marland Kitchen   EKG  Interpretation Date/Time:  Friday October 17 2023 09:09:23 EDT Ventricular Rate:  56 PR Interval:  194 QRS Duration:  74 QT Interval:  398 QTC Calculation: 384 R Axis:   11  Text Interpretation: Sinus bradycardia No previous ECGs available Confirmed by Donato Schultz ([id number]) on 10/17/2023 9:11:40 AM     Risk Assessment/Calculations:            Physical Exam:   VS:  BP 132/62   Pulse (!) 58   Ht 5\' 11"  (1.803 m)   Wt 192 lb 3.2 oz (87.2 kg)   SpO2 99%   BMI 26.81 kg/m    Wt Readings from Last 3 Encounters:  10/17/23 192 lb 3.2 oz (87.2 kg)  10/01/23 190 lb 6.4 oz (86.4 kg)  07/17/23 186 lb 9.6 oz (84.6 kg)    GEN: Well nourished, well developed in no acute distress NECK: No JVD; No carotid bruits CARDIAC: RRR, no murmurs, no rubs, no gallops RESPIRATORY:  Clear to auscultation without rales, wheezing or rhonchi  ABDOMEN: Soft, non-tender, non-distended EXTREMITIES:  No edema; No deformity   ASSESSMENT AND PLAN: .    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 Discussed that this pattern can occasionally be a signal for heart disease, but does not definitively indicate heart disease. -Order echocardiogram to assess heart function.  Atypical Chest Discomfort with DM Reports occasional precordial discomfort associated with high salt intake. No exertional symptoms. 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  Hyperlipidemia LDL at goal (57), but triglycerides elevated (320). -Continue atorvastatin 40mg  daily.  Type 2 Diabetes Mellitus Hemoglobin A1c 7.9, slightly above goal. -Continue metformin, Jardiance 25mg , and Glimepiride as prescribed.  Hypertension Managed with lisinopril 5mg  daily for renal protection. -Continue current regimen.  Aortic Atherosclerosis Noted on prior ultrasound  report. -Continue aspirin 81mg  daily for cardiovascular protection.  Follow-up Await results of echocardiogram and coronary CT.               Signed, Donato Schultz, MD

## 2023-10-20 ENCOUNTER — Other Ambulatory Visit: Payer: Self-pay | Admitting: Endocrinology

## 2023-10-22 DIAGNOSIS — H52223 Regular astigmatism, bilateral: Secondary | ICD-10-CM | POA: Diagnosis not present

## 2023-10-22 DIAGNOSIS — H25813 Combined forms of age-related cataract, bilateral: Secondary | ICD-10-CM | POA: Diagnosis not present

## 2023-10-22 DIAGNOSIS — E113391 Type 2 diabetes mellitus with moderate nonproliferative diabetic retinopathy without macular edema, right eye: Secondary | ICD-10-CM | POA: Diagnosis not present

## 2023-10-22 DIAGNOSIS — H04123 Dry eye syndrome of bilateral lacrimal glands: Secondary | ICD-10-CM | POA: Diagnosis not present

## 2023-10-22 DIAGNOSIS — H524 Presbyopia: Secondary | ICD-10-CM | POA: Diagnosis not present

## 2023-10-22 DIAGNOSIS — E113312 Type 2 diabetes mellitus with moderate nonproliferative diabetic retinopathy with macular edema, left eye: Secondary | ICD-10-CM | POA: Diagnosis not present

## 2023-10-22 DIAGNOSIS — H5213 Myopia, bilateral: Secondary | ICD-10-CM | POA: Diagnosis not present

## 2023-11-06 ENCOUNTER — Encounter (HOSPITAL_COMMUNITY): Payer: Self-pay

## 2023-11-10 ENCOUNTER — Ambulatory Visit (HOSPITAL_COMMUNITY)
Admission: RE | Admit: 2023-11-10 | Discharge: 2023-11-10 | Disposition: A | Discharge status: Home / Self Care | Payer: Medicare Other | Source: Ambulatory Visit | Attending: Cardiology | Admitting: Cardiology

## 2023-11-10 ENCOUNTER — Ambulatory Visit (HOSPITAL_BASED_OUTPATIENT_CLINIC_OR_DEPARTMENT_OTHER)
Admission: RE | Admit: 2023-11-10 | Discharge: 2023-11-10 | Disposition: A | Discharge status: Home / Self Care | Payer: Medicare Other | Source: Ambulatory Visit | Attending: Cardiovascular Disease | Admitting: Cardiovascular Disease

## 2023-11-10 ENCOUNTER — Other Ambulatory Visit: Payer: Self-pay | Admitting: Cardiovascular Disease

## 2023-11-10 DIAGNOSIS — I2510 Atherosclerotic heart disease of native coronary artery without angina pectoris: Secondary | ICD-10-CM

## 2023-11-10 DIAGNOSIS — R931 Abnormal findings on diagnostic imaging of heart and coronary circulation: Secondary | ICD-10-CM | POA: Insufficient documentation

## 2023-11-10 DIAGNOSIS — R072 Precordial pain: Secondary | ICD-10-CM | POA: Insufficient documentation

## 2023-11-10 DIAGNOSIS — E785 Hyperlipidemia, unspecified: Secondary | ICD-10-CM | POA: Diagnosis not present

## 2023-11-10 MED ORDER — IOHEXOL 350 MG/ML SOLN
95.0000 mL | Freq: Once | INTRAVENOUS | Status: AC | PRN
  Administered 2023-11-10: 95 mL via INTRAVENOUS

## 2023-11-10 MED ORDER — NITROGLYCERIN 0.4 MG SL SUBL
0.8000 mg | SUBLINGUAL_TABLET | SUBLINGUAL | Status: DC | PRN
  Administered 2023-11-10: 0.8 mg via SUBLINGUAL

## 2023-11-10 MED ORDER — NITROGLYCERIN 0.4 MG SL SUBL
SUBLINGUAL_TABLET | SUBLINGUAL | Status: AC
  Filled 2023-11-10: qty 2

## 2023-11-14 ENCOUNTER — Ambulatory Visit: Payer: Medicare Other | Attending: Cardiology | Admitting: Cardiology

## 2023-11-14 ENCOUNTER — Other Ambulatory Visit: Payer: Self-pay | Admitting: *Deleted

## 2023-11-14 ENCOUNTER — Encounter: Payer: Self-pay | Admitting: Cardiology

## 2023-11-14 VITALS — BP 130/70 | HR 71 | Ht 71.0 in | Wt 192.0 lb

## 2023-11-14 DIAGNOSIS — E119 Type 2 diabetes mellitus without complications: Secondary | ICD-10-CM | POA: Diagnosis not present

## 2023-11-14 DIAGNOSIS — Z01812 Encounter for preprocedural laboratory examination: Secondary | ICD-10-CM | POA: Diagnosis not present

## 2023-11-14 DIAGNOSIS — R072 Precordial pain: Secondary | ICD-10-CM

## 2023-11-14 DIAGNOSIS — I10 Essential (primary) hypertension: Secondary | ICD-10-CM | POA: Insufficient documentation

## 2023-11-14 DIAGNOSIS — I2510 Atherosclerotic heart disease of native coronary artery without angina pectoris: Secondary | ICD-10-CM | POA: Insufficient documentation

## 2023-11-14 NOTE — H&P (View-Only) (Signed)
 Cardiology Office Note:  .   Date:  11/14/2023  ID:  Nathan Duncan, DOB [birth year], MRN [id number] PCP: Nathan Shiver, DO  [facility] Providers Cardiologist:  Nathan Schultz, MD     History of Present Illness: .   Nathan Duncan is a 72 y.o. male Discussed with the use of AI scribe   History of Present Illness   A 72 year old patient with a history of cardiac catheterization 35 years ago, diabetes, and recently diagnosed chain stoke breathing was evaluated for chest discomfort following high salt intake. Low energy. The patient underwent a coronary CT scan on 11/10/23, which revealed a calcium score of 370, a subtotally occluded mid LAD involving the origin of a large first diagonal, and obstructive coronary disease in obtuse marginal one.  The patient's diabetes is managed by endocrinology with Jardiance 25mg  and glimepiride. The patient's LDL is at goal at 57, but triglycerides are elevated at 320 and CRP is high at 17. The patient's creatinine was 1.04 at last check and Hemoglobin A1c was 7.9. 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 The patient's EKG showed sinus bradycardia with a heart rate of 56 beats per minute and aortic atherosclerosis was also noted. Wife, Nathan Duncan, present for discussion.          Studies Reviewed: Marland Kitchen   EKG Interpretation Date/Time:  Friday November 14 2023 10:18:15 EST Ventricular Rate:  71 PR Interval:  188 QRS Duration:  76 QT Interval:  368 QTC Calculation: 399 R Axis:   7  Text Interpretation: Normal sinus rhythm with sinus arrhythmia Low voltage QRS When compared with ECG of 17-Oct-2023 09:09, No significant change was found Confirmed by Nathan Duncan ([id number]) on 11/14/2023 10:20:32 AM    Results LABS LDL:  57 Triglycerides: 320 CRP: 17 Creatinine: 1.04 Hemoglobin A1c: 7.9  RADIOLOGY Coronary CT scan: Calcium score 370, subtotally occluded mid LAD involving the origin of large first diagonal, obstructive coronary disease in obtuse marginal one (FFR 0.73), large diagonal branch (FFR 0.75), right coronary normal (11/10/2023)  DIAGNOSTIC Home sleep study: Cheyne-Stokes breathing EKG: Sinus bradycardia, aortic atherosclerosis  Risk Assessment/Calculations:            Physical Exam:   VS:  BP 130/70 (BP Location: Left Arm, Patient Position: Sitting, Cuff Size: Normal)   Pulse 71   Ht 5\' 11"  (1.803 m)   Wt 192 lb (87.1 kg)   BMI 26.78 kg/m    Wt Readings from Last 3 Encounters:  11/14/23 192 lb (87.1 kg)  10/17/23 192 lb 3.2 oz (87.2 kg)  10/01/23 190 lb 6.4 oz (86.4 kg)    GEN: Well nourished, well developed in no acute distress NECK: No JVD; No carotid bruits CARDIAC: RRR, no murmurs, no rubs, no gallops RESPIRATORY:  Clear to auscultation without rales, wheezing or rhonchi  ABDOMEN: Soft, non-tender, non-distended EXTREMITIES:  No edema; No deformity   ASSESSMENT AND PLAN: .    Assessment and Plan    Coronary Artery Disease Coronary CT scan shows calcium score of 370, subtotal occlusion of mid LAD involving the origin of the large first diagonal, and obstructive coronary disease in obtuse marginal one (FFR 0.73). Large diagonal branch FFR was 0.75. Right coronary artery appears normal. Precordial discomfort after high salt intake. Cardiac  catheterization planned for definitive diagnosis and potential percutaneous intervention. Risks include stroke, heart attack, death, renal impairment, and bleeding. Benefits include clear diagnosis and potential immediate intervention. Risk of stroke, heart attack, or death from the procedure is less than 1 in 1000. Patient consents to proceed. - Proceed with cardiac catheterization - Discuss risks and benefits including stroke, heart attack,  death, renal impairment, bleeding - Schedule the procedure at the hospital - Review recent blood work and order additional tests if necessary  Aortic Atherosclerosis Aortic atherosclerosis noted on CT. - Monitor for progression  Sinus Bradycardia EKG showed sinus bradycardia with a heart rate of 56 bpm. 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 - Continue follow-up with appropriate specialists  Diabetes Mellitus Diabetes mellitus managed by endocrinology. On Jardiance 25 mg and glimepiride. Most recent hemoglobin A1c was 7.9%. Experiences polyneuropathy in toes and feet, managed with gabapentin. - Continue Jardiance 25 mg and glimepiride - Monitor hemoglobin A1c - Continue gabapentin for polyneuropathy  Hyperlipidemia LDL at goal at 57, but triglycerides elevated at 320. CRP high at 17. - Continue current lipid-lowering therapy - Monitor lipid panel and CRP levels  General Health Maintenance Quit smoking over 45 years ago. 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 - Encourage a healthy diet and regular exercise - Continue regular follow-ups with primary care and specialists  Follow-up - Schedule follow-up appointment post-cardiac catheterization.           Informed Consent   Shared Decision Making/Informed Consent The risks [stroke (1 in 1000), death (1 in 1000), kidney failure [usually temporary] (1 in 500), bleeding (1 in 200), allergic reaction [possibly serious] (1 in 200)], benefits (diagnostic support and management of coronary artery disease) and alternatives of a cardiac catheterization were discussed in detail with Nathan Duncan and he is willing to proceed.       Signed, Nathan Schultz, MD

## 2023-11-14 NOTE — Patient Instructions (Signed)
Medication Instructions:  The current medical regimen is effective;  continue present plan and medications.  *If you need a refill on your cardiac medications before your next appointment, please call your pharmacy*   Lab Work: Please have blood work Wednesday 11/27.  Lab opens at 7:30 am to 4:30 pm-lucnh 12:30 to 1:30 pm.  You do not have to be fasting.  If you have labs (blood work) drawn today and your tests are completely normal, you will receive your results only by: MyChart Message (if you have MyChart) OR A paper copy in the mail If you have any lab test that is abnormal or we need to change your treatment, we will call you to review the results.   Testing/Procedures:   [facility] A DEPT OF [place]. [facility] AT [facility] [address] [place], [place] [address] [place] [address] Dept: [phone] Loc: [phone]  MCCLINTON TEH  11/14/2023  You are scheduled for a Cardiac Catheterization on Tuesday, December 3 with Dr. Bryan Lemma.  1. Please arrive at the [facility] (Main Entrance A) at [facility]: [address] [place], [place] [address] at 7:00 AM (two hours before your procedure to ensure your preparation). Free valet parking service is available.   Special note: Every effort is made to have your procedure done on time. Please understand that emergencies sometimes delay scheduled procedures.  2. Diet: Do not eat or drink anything after midnight prior to your procedure except sips of water to take medications.  3. Labs:  Have Wednesday 11/19/2023.  4. Medication instructions in preparation for your procedure:  Only take 1/2 your normal dose of insulin the night before and none the morning of.  Please hold oral medication for DM the morning of and 48 hours after your procedure. (Including Jardiance per Dr Anne Fu)  On the morning of your procedure, take your Aspirin 81 mg and any morning medicines NOT  listed above.  You may use sips of water.  5. Plan for one night stay--bring personal belongings. 6. Bring a current list of your medications and current insurance cards. 7. You MUST have a responsible person to drive you home. 8. Someone MUST be with you the first 24 hours after you arrive home or your discharge will be delayed. 9. Please wear clothes that are easy to get on and off and wear slip-on shoes.  Thank you for allowing [place] to care for you!   -- [place] [facility]  Follow-Up: At [facility], you and your health needs are our priority.  As part of our continuing mission to provide you with exceptional heart care, we have created designated Provider Care Teams.  These Care Teams include your primary Cardiologist (physician) and Advanced Practice Providers (APPs -  Physician Assistants and Nurse Practitioners) who all work together to provide you with the care you need, when you need it.  We recommend signing up for the patient portal called "MyChart".  Sign up information is provided on this After Visit Summary.  MyChart is used to connect with patients for Virtual Visits (Telemedicine).  Patients are able to view lab/test results, encounter notes, upcoming appointments, etc.  Non-urgent messages can be sent to your provider as well.   To learn more about what you can do with MyChart, go to [email or web address].    Your next appointment:   1 year(s)  Provider:   Dr Donato Schultz

## 2023-11-14 NOTE — Progress Notes (Signed)
Cardiology Office Note:  .   Date:  11/14/2023  ID:  Nathan Duncan, DOB [birth year], MRN [id number] PCP: Nathan Shiver, DO  [facility] Providers Cardiologist:  Nathan Schultz, MD     History of Present Illness: .   Nathan Duncan is a 72 y.o. male Discussed with the use of AI scribe   History of Present Illness   A 72 year old patient with a history of cardiac catheterization 35 years ago, diabetes, and recently diagnosed chain stoke breathing was evaluated for chest discomfort following high salt intake. Low energy. The patient underwent a coronary CT scan on 11/10/23, which revealed a calcium score of 370, a subtotally occluded mid LAD involving the origin of a large first diagonal, and obstructive coronary disease in obtuse marginal one.  The patient's diabetes is managed by endocrinology with Jardiance 25mg  and glimepiride. The patient's LDL is at goal at 57, but triglycerides are elevated at 320 and CRP is high at 17. The patient's creatinine was 1.04 at last check and Hemoglobin A1c was 7.9. 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 The patient's EKG showed sinus bradycardia with a heart rate of 56 beats per minute and aortic atherosclerosis was also noted. Wife, Nathan Duncan, present for discussion.          Studies Reviewed: Marland Kitchen   EKG Interpretation Date/Time:  Friday November 14 2023 10:18:15 EST Ventricular Rate:  71 PR Interval:  188 QRS Duration:  76 QT Interval:  368 QTC Calculation: 399 R Axis:   7  Text Interpretation: Normal sinus rhythm with sinus arrhythmia Low voltage QRS When compared with ECG of 17-Oct-2023 09:09, No significant change was found Confirmed by Nathan Duncan ([id number]) on 11/14/2023 10:20:32 AM    Results LABS LDL:  57 Triglycerides: 320 CRP: 17 Creatinine: 1.04 Hemoglobin A1c: 7.9  RADIOLOGY Coronary CT scan: Calcium score 370, subtotally occluded mid LAD involving the origin of large first diagonal, obstructive coronary disease in obtuse marginal one (FFR 0.73), large diagonal branch (FFR 0.75), right coronary normal (11/10/2023)  DIAGNOSTIC Home sleep study: Cheyne-Stokes breathing EKG: Sinus bradycardia, aortic atherosclerosis  Risk Assessment/Calculations:            Physical Exam:   VS:  BP 130/70 (BP Location: Left Arm, Patient Position: Sitting, Cuff Size: Normal)   Pulse 71   Ht 5\' 11"  (1.803 m)   Wt 192 lb (87.1 kg)   BMI 26.78 kg/m    Wt Readings from Last 3 Encounters:  11/14/23 192 lb (87.1 kg)  10/17/23 192 lb 3.2 oz (87.2 kg)  10/01/23 190 lb 6.4 oz (86.4 kg)    GEN: Well nourished, well developed in no acute distress NECK: No JVD; No carotid bruits CARDIAC: RRR, no murmurs, no rubs, no gallops RESPIRATORY:  Clear to auscultation without rales, wheezing or rhonchi  ABDOMEN: Soft, non-tender, non-distended EXTREMITIES:  No edema; No deformity   ASSESSMENT AND PLAN: .    Assessment and Plan    Coronary Artery Disease Coronary CT scan shows calcium score of 370, subtotal occlusion of mid LAD involving the origin of the large first diagonal, and obstructive coronary disease in obtuse marginal one (FFR 0.73). Large diagonal branch FFR was 0.75. Right coronary artery appears normal. Precordial discomfort after high salt intake. Cardiac  catheterization planned for definitive diagnosis and potential percutaneous intervention. Risks include stroke, heart attack, death, renal impairment, and bleeding. Benefits include clear diagnosis and potential immediate intervention. Risk of stroke, heart attack, or death from the procedure is less than 1 in 1000. Patient consents to proceed. - Proceed with cardiac catheterization - Discuss risks and benefits including stroke, heart attack,  death, renal impairment, bleeding - Schedule the procedure at the hospital - Review recent blood work and order additional tests if necessary  Aortic Atherosclerosis Aortic atherosclerosis noted on CT. - Monitor for progression  Sinus Bradycardia EKG showed sinus bradycardia with a heart rate of 56 bpm. 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 - Continue follow-up with appropriate specialists  Diabetes Mellitus Diabetes mellitus managed by endocrinology. On Jardiance 25 mg and glimepiride. Most recent hemoglobin A1c was 7.9%. Experiences polyneuropathy in toes and feet, managed with gabapentin. - Continue Jardiance 25 mg and glimepiride - Monitor hemoglobin A1c - Continue gabapentin for polyneuropathy  Hyperlipidemia LDL at goal at 57, but triglycerides elevated at 320. CRP high at 17. - Continue current lipid-lowering therapy - Monitor lipid panel and CRP levels  General Health Maintenance Quit smoking over 45 years ago. 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 - Encourage a healthy diet and regular exercise - Continue regular follow-ups with primary care and specialists  Follow-up - Schedule follow-up appointment post-cardiac catheterization.           Informed Consent   Shared Decision Making/Informed Consent The risks [stroke (1 in 1000), death (1 in 1000), kidney failure [usually temporary] (1 in 500), bleeding (1 in 200), allergic reaction [possibly serious] (1 in 200)], benefits (diagnostic support and management of coronary artery disease) and alternatives of a cardiac catheterization were discussed in detail with Nathan Duncan and he is willing to proceed.       Signed, Nathan Schultz, MD

## 2023-11-14 NOTE — H&P (View-Only) (Signed)
 Cardiology Office Note:  .   Date:  11/14/2023  ID:  [name], DOB [birth year], MRN [id number] PCP: Koren Shiver, DO  [facility] Providers Cardiologist:  Donato Schultz, MD     History of Present Illness: .   Nathan Duncan is a 72 y.o. male Discussed with the use of AI scribe   History of Present Illness   A 72 year old patient with a history of cardiac catheterization 35 years ago, diabetes, and recently diagnosed chain stoke breathing was evaluated for chest discomfort following high salt intake. Low energy. The patient underwent a coronary CT scan on 11/10/23, which revealed a calcium score of 370, a subtotally occluded mid LAD involving the origin of a large first diagonal, and obstructive coronary disease in obtuse marginal one.  The patient's diabetes is managed by endocrinology with Jardiance 25mg  and glimepiride. The patient's LDL is at goal at 57, but triglycerides are elevated at 320 and CRP is high at 17. The patient's creatinine was 1.04 at last check and Hemoglobin A1c was 7.9. 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 The patient's EKG showed sinus bradycardia with a heart rate of 56 beats per minute and aortic atherosclerosis was also noted. Wife, [name], present for discussion.          Studies Reviewed: Marland Kitchen   EKG Interpretation Date/Time:  Friday November 14 2023 10:18:15 EST Ventricular Rate:  71 PR Interval:  188 QRS Duration:  76 QT Interval:  368 QTC Calculation: 399 R Axis:   7  Text Interpretation: Normal sinus rhythm with sinus arrhythmia Low voltage QRS When compared with ECG of 17-Oct-2023 09:09, No significant change was found Confirmed by Donato Schultz ([id number]) on 11/14/2023 10:20:32 AM    Results LABS LDL:  57 Triglycerides: 320 CRP: 17 Creatinine: 1.04 Hemoglobin A1c: 7.9  RADIOLOGY Coronary CT scan: Calcium score 370, subtotally occluded mid LAD involving the origin of large first diagonal, obstructive coronary disease in obtuse marginal one (FFR 0.73), large diagonal branch (FFR 0.75), right coronary normal (11/10/2023)  DIAGNOSTIC Home sleep study: Cheyne-Stokes breathing EKG: Sinus bradycardia, aortic atherosclerosis  Risk Assessment/Calculations:            Physical Exam:   VS:  BP 130/70 (BP Location: Left Arm, Patient Position: Sitting, Cuff Size: Normal)   Pulse 71   Ht 5\' 11"  (1.803 m)   Wt 192 lb (87.1 kg)   BMI 26.78 kg/m    Wt Readings from Last 3 Encounters:  11/14/23 192 lb (87.1 kg)  10/17/23 192 lb 3.2 oz (87.2 kg)  10/01/23 190 lb 6.4 oz (86.4 kg)    GEN: Well nourished, well developed in no acute distress NECK: No JVD; No carotid bruits CARDIAC: RRR, no murmurs, no rubs, no gallops RESPIRATORY:  Clear to auscultation without rales, wheezing or rhonchi  ABDOMEN: Soft, non-tender, non-distended EXTREMITIES:  No edema; No deformity   ASSESSMENT AND PLAN: .    Assessment and Plan    Coronary Artery Disease Coronary CT scan shows calcium score of 370, subtotal occlusion of mid LAD involving the origin of the large first diagonal, and obstructive coronary disease in obtuse marginal one (FFR 0.73). Large diagonal branch FFR was 0.75. Right coronary artery appears normal. Precordial discomfort after high salt intake. Cardiac  catheterization planned for definitive diagnosis and potential percutaneous intervention. Risks include stroke, heart attack, death, renal impairment, and bleeding. Benefits include clear diagnosis and potential immediate intervention. Risk of stroke, heart attack, or death from the procedure is less than 1 in 1000. Patient consents to proceed. - Proceed with cardiac catheterization - Discuss risks and benefits including stroke, heart attack,  death, renal impairment, bleeding - Schedule the procedure at the hospital - Review recent blood work and order additional tests if necessary  Aortic Atherosclerosis Aortic atherosclerosis noted on CT. - Monitor for progression  Sinus Bradycardia EKG showed sinus bradycardia with a heart rate of 56 bpm. 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 - Continue follow-up with appropriate specialists  Diabetes Mellitus Diabetes mellitus managed by endocrinology. On Jardiance 25 mg and glimepiride. Most recent hemoglobin A1c was 7.9%. Experiences polyneuropathy in toes and feet, managed with gabapentin. - Continue Jardiance 25 mg and glimepiride - Monitor hemoglobin A1c - Continue gabapentin for polyneuropathy  Hyperlipidemia LDL at goal at 57, but triglycerides elevated at 320. CRP high at 17. - Continue current lipid-lowering therapy - Monitor lipid panel and CRP levels  General Health Maintenance Quit smoking over 45 years ago. 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 - Encourage a healthy diet and regular exercise - Continue regular follow-ups with primary care and specialists  Follow-up - Schedule follow-up appointment post-cardiac catheterization.           Informed Consent   Shared Decision Making/Informed Consent The risks [stroke (1 in 1000), death (1 in 1000), kidney failure [usually temporary] (1 in 500), bleeding (1 in 200), allergic reaction [possibly serious] (1 in 200)], benefits (diagnostic support and management of coronary artery disease) and alternatives of a cardiac catheterization were discussed in detail with Mr. Grindstaff and he is willing to proceed.       Signed, Donato Schultz, MD

## 2023-11-17 ENCOUNTER — Ambulatory Visit (HOSPITAL_COMMUNITY): Payer: Medicare Other | Attending: Cardiology

## 2023-11-17 DIAGNOSIS — R072 Precordial pain: Secondary | ICD-10-CM | POA: Diagnosis not present

## 2023-11-17 LAB — ECHOCARDIOGRAM COMPLETE
Area-P 1/2: 3.31 cm2
S' Lateral: 2.5 cm

## 2023-11-19 ENCOUNTER — Other Ambulatory Visit: Payer: Self-pay | Admitting: Endocrinology

## 2023-11-19 DIAGNOSIS — Z01812 Encounter for preprocedural laboratory examination: Secondary | ICD-10-CM | POA: Diagnosis not present

## 2023-11-19 DIAGNOSIS — R072 Precordial pain: Secondary | ICD-10-CM | POA: Diagnosis not present

## 2023-11-20 LAB — BASIC METABOLIC PANEL
BUN/Creatinine Ratio: 21 (ref 10–24)
BUN: 27 mg/dL (ref 8–27)
CO2: 23 mmol/L (ref 20–29)
Calcium: 9.8 mg/dL (ref 8.6–10.2)
Chloride: 102 mmol/L (ref 96–106)
Creatinine, Ser: 1.26 mg/dL (ref 0.76–1.27)
Glucose: 145 mg/dL — ABNORMAL HIGH (ref 70–99)
Potassium: 5.2 mmol/L (ref 3.5–5.2)
Sodium: 139 mmol/L (ref 134–144)
eGFR: 60 mL/min/{1.73_m2} (ref >59)

## 2023-11-20 LAB — CBC
Hematocrit: 45.9 % (ref 37.5–51.0)
Hemoglobin: 14.8 g/dL (ref 13.0–17.7)
MCH: 29 pg (ref 26.6–33.0)
MCHC: 32.2 g/dL (ref 31.5–35.7)
MCV: 90 fL (ref 79–97)
Platelets: 255 10*3/uL (ref 150–450)
RBC: 5.11 x10E6/uL (ref 4.14–5.80)
RDW: 13.1 % (ref 11.6–15.4)
WBC: 7 10*3/uL (ref 3.4–10.8)

## 2023-11-24 ENCOUNTER — Telehealth: Payer: Self-pay | Admitting: *Deleted

## 2023-11-24 NOTE — Telephone Encounter (Signed)
Cardiac Catheterization scheduled at [facility] for: Tuesday November 25, 2023 9 AM Arrival time [facility] Main Entrance A at: 7 AM  Nothing to eat after midnight prior to procedure, clear liquids until 5 AM day of procedure.  Medication instructions: -Hold:  Insulin -AM of procedure/1/2 usual Insulin dose HS prior to procedure  Metformin-day of procedure and 48 hours post procedure    Glimepiride/Jardiance-AM of procedure -Other usual morning medications can be taken with sips of water including aspirin 81 mg.  Plan to go home the same day, you will only stay overnight if medically necessary.  You must have responsible adult to drive you home.  Someone must be with you the first 24 hours after you arrive home.  Reviewed procedure instructions with patient.

## 2023-11-25 ENCOUNTER — Encounter (HOSPITAL_COMMUNITY)
Admission: RE | Disposition: A | Discharge status: Home / Self Care | Payer: Self-pay | Source: Home / Self Care | Attending: Cardiology

## 2023-11-25 ENCOUNTER — Encounter (HOSPITAL_COMMUNITY): Payer: Self-pay | Admitting: Cardiology

## 2023-11-25 ENCOUNTER — Ambulatory Visit (HOSPITAL_COMMUNITY)
Admission: RE | Admit: 2023-11-25 | Discharge: 2023-11-25 | Disposition: A | Discharge status: Home / Self Care | Payer: Medicare Other | Attending: Cardiology | Admitting: Cardiology

## 2023-11-25 ENCOUNTER — Other Ambulatory Visit: Payer: Self-pay

## 2023-11-25 DIAGNOSIS — E119 Type 2 diabetes mellitus without complications: Secondary | ICD-10-CM

## 2023-11-25 DIAGNOSIS — E1142 Type 2 diabetes mellitus with diabetic polyneuropathy: Secondary | ICD-10-CM | POA: Diagnosis not present

## 2023-11-25 DIAGNOSIS — Z87891 Personal history of nicotine dependence: Secondary | ICD-10-CM | POA: Diagnosis not present

## 2023-11-25 DIAGNOSIS — E781 Pure hyperglyceridemia: Secondary | ICD-10-CM | POA: Insufficient documentation

## 2023-11-25 DIAGNOSIS — I2510 Atherosclerotic heart disease of native coronary artery without angina pectoris: Secondary | ICD-10-CM

## 2023-11-25 DIAGNOSIS — R079 Chest pain, unspecified: Secondary | ICD-10-CM | POA: Insufficient documentation

## 2023-11-25 DIAGNOSIS — R072 Precordial pain: Secondary | ICD-10-CM

## 2023-11-25 DIAGNOSIS — R001 Bradycardia, unspecified: Secondary | ICD-10-CM | POA: Diagnosis not present

## 2023-11-25 DIAGNOSIS — R063 Periodic breathing: Secondary | ICD-10-CM | POA: Diagnosis not present

## 2023-11-25 DIAGNOSIS — I700 Atherosclerosis of aorta: Secondary | ICD-10-CM | POA: Insufficient documentation

## 2023-11-25 DIAGNOSIS — Z01812 Encounter for preprocedural laboratory examination: Secondary | ICD-10-CM

## 2023-11-25 DIAGNOSIS — Z7984 Long term (current) use of oral hypoglycemic drugs: Secondary | ICD-10-CM | POA: Diagnosis not present

## 2023-11-25 DIAGNOSIS — I2582 Chronic total occlusion of coronary artery: Secondary | ICD-10-CM | POA: Diagnosis not present

## 2023-11-25 DIAGNOSIS — R933 Abnormal findings on diagnostic imaging of other parts of digestive tract: Secondary | ICD-10-CM | POA: Insufficient documentation

## 2023-11-25 HISTORY — PX: LEFT HEART CATH AND CORONARY ANGIOGRAPHY: CATH118249

## 2023-11-25 LAB — GLUCOSE, CAPILLARY
Glucose-Capillary: 150 mg/dL — ABNORMAL HIGH (ref 70–99)
Glucose-Capillary: 136 mg/dL — ABNORMAL HIGH (ref 70–99)

## 2023-11-25 SURGERY — LEFT HEART CATH AND CORONARY ANGIOGRAPHY
Anesthesia: LOCAL

## 2023-11-25 MED ORDER — VERAPAMIL HCL 2.5 MG/ML IV SOLN
INTRAVENOUS | Status: AC
  Filled 2023-11-25: qty 2

## 2023-11-25 MED ORDER — HEPARIN SODIUM (PORCINE) 1000 UNIT/ML IJ SOLN
INTRAMUSCULAR | Status: DC | PRN
  Administered 2023-11-25: 4000 [IU] via INTRAVENOUS

## 2023-11-25 MED ORDER — MIDAZOLAM HCL 2 MG/2ML IJ SOLN
INTRAMUSCULAR | Status: DC | PRN
  Administered 2023-11-25: 2 mg via INTRAVENOUS

## 2023-11-25 MED ORDER — LIDOCAINE HCL (PF) 1 % IJ SOLN
INTRAMUSCULAR | Status: AC
  Filled 2023-11-25: qty 30

## 2023-11-25 MED ORDER — SODIUM CHLORIDE 0.9 % WEIGHT BASED INFUSION: 1.0000 mL/kg/h | INTRAVENOUS | Status: DC

## 2023-11-25 MED ORDER — HEPARIN SODIUM (PORCINE) 1000 UNIT/ML IJ SOLN
INTRAMUSCULAR | Status: AC
  Filled 2023-11-25: qty 10

## 2023-11-25 MED ORDER — ASPIRIN 81 MG PO CHEW
81.0000 mg | CHEWABLE_TABLET | ORAL | Status: AC
Start: 2023-11-26 — End: 2023-11-25
  Administered 2023-11-25: 81 mg via ORAL
  Filled 2023-11-25: qty 1

## 2023-11-25 MED ORDER — IOHEXOL 350 MG/ML SOLN
INTRAVENOUS | Status: DC | PRN
  Administered 2023-11-25: 45 mL

## 2023-11-25 MED ORDER — MIDAZOLAM HCL 2 MG/2ML IJ SOLN
INTRAMUSCULAR | Status: AC
  Filled 2023-11-25: qty 2

## 2023-11-25 MED ORDER — SODIUM CHLORIDE 0.9 % WEIGHT BASED INFUSION
3.0000 mL/kg/h | INTRAVENOUS | Status: AC
  Administered 2023-11-25: 3 mL/kg/h via INTRAVENOUS

## 2023-11-25 MED ORDER — FENTANYL CITRATE (PF) 100 MCG/2ML IJ SOLN
INTRAMUSCULAR | Status: AC
  Filled 2023-11-25: qty 2

## 2023-11-25 MED ORDER — HEPARIN (PORCINE) IN NACL 1000-0.9 UT/500ML-% IV SOLN
INTRAVENOUS | Status: DC | PRN
  Administered 2023-11-25 (×2): 500 mL

## 2023-11-25 MED ORDER — VERAPAMIL HCL 2.5 MG/ML IV SOLN
INTRAVENOUS | Status: DC | PRN
  Administered 2023-11-25: 10 mL via INTRA_ARTERIAL

## 2023-11-25 MED ORDER — LIDOCAINE HCL (PF) 1 % IJ SOLN
INTRAMUSCULAR | Status: DC | PRN
  Administered 2023-11-25: 5 mL via INTRADERMAL

## 2023-11-25 MED ORDER — FENTANYL CITRATE (PF) 100 MCG/2ML IJ SOLN
INTRAMUSCULAR | Status: DC | PRN
  Administered 2023-11-25: 25 ug via INTRAVENOUS

## 2023-11-25 SURGICAL SUPPLY — 9 items
CATH INFINITI 5 FR JL3.5 (CATHETERS) IMPLANT
CATH INFINITI AMBI 5FR TG (CATHETERS) IMPLANT
DEVICE RAD COMP TR BAND LRG (VASCULAR PRODUCTS) IMPLANT
GLIDESHEATH SLEND SS 6F .021 (SHEATH) IMPLANT
GUIDEWIRE INQWIRE 1.5J.035X260 (WIRE) IMPLANT
INQWIRE 1.5J .035X260CM (WIRE) ×1
PACK CARDIAC CATHETERIZATION (CUSTOM PROCEDURE TRAY) ×1 IMPLANT
SET ATX-X65L (MISCELLANEOUS) IMPLANT
WIRE MICROINTRODUCER 60CM (WIRE) IMPLANT

## 2023-11-25 NOTE — Interval H&P Note (Signed)
History and Physical Interval Note:  11/25/2023 9:37 AM  Nathan Duncan  has presented today for surgery, with the diagnosis of chest pain - abnormal ct.  The various methods of treatment have been discussed with the patient and family. After consideration of risks, benefits and other options for treatment, the patient has consented to  Procedure(s): LEFT HEART CATH AND CORONARY ANGIOGRAPHY (N/A)  PERCUTANEOUS CORONARY INTERVENTION  as a surgical intervention.  The patient's history has been reviewed, patient examined, no change in status, stable for surgery.  I have reviewed the patient's chart and labs.  Questions were answered to the patient's satisfaction.     Bryan Lemma

## 2023-11-25 NOTE — Discharge Instructions (Signed)
Radial Site Care The following information offers guidance on how to care for yourself after your procedure. Your health care provider may also give you more specific instructions. If you have problems or questions, contact your health care provider. What can I expect after the procedure? After the procedure, it is common to have bruising and tenderness in the incision area. Follow these instructions at home: Incision site care  Follow instructions from your health care provider about how to take care of your incision site. Make sure you: Wash your hands with soap and water for at least 20 seconds before and after you change your bandage (dressing). If soap and water are not available, use hand sanitizer. Remove your dressing in 24 hours. Leave stitches (sutures), skin glue, or adhesive strips in place. These skin closures may need to stay in place for 2 weeks or longer. If adhesive strip edges start to loosen and curl up, you may trim the loose edges. Do not remove adhesive strips completely unless your health care provider tells you to do that. Do not take baths, swim, or use a hot tub for at least 1 week. You may shower 24 hours after the procedure or as told by your health care provider. Remove the dressing and gently wash the incision area with plain soap and water. Pat the area dry with a clean towel. Do not rub the site. That could cause bleeding. Do not apply powder or lotion to the site. Check your incision site every day for signs of infection. Check for: Redness, swelling, or pain. Fluid or blood. Warmth. Pus or a bad smell. Activity For 24 hours after the procedure, or as directed by your health care provider: Do not flex or bend the affected arm. Do not push or pull heavy objects with the affected arm. Do not operate machinery or power tools. Do not drive. You should not drive yourself home from the hospital or clinic if you go home during that time period. You may drive 24  hours after the procedure unless your health care provider tells you not to. Do not lift anything that is heavier than 10 lb (4.5 kg), or the limit that you are told, until your health care provider says that it is safe. Return to your normal activities as told by your health care provider. Ask your health care provider what activities are safe for you and when you can return to work. If you were given a sedative during the procedure, it can affect you for several hours. Do not drive or operate machinery until your health care provider says that it is safe. General instructions Take over-the-counter and prescription medicines only as told by your health care provider. If you will be going home right after the procedure, plan to have a responsible adult care for you for the time you are told. This is important. Keep all follow-up visits. This is important. Contact a health care provider if: You have a fever or chills. You have any of these signs of infection at your incision site: Redness, swelling, or pain. Fluid or blood. Warmth. Pus or a bad smell. Get help right away if: The incision area swells very fast. The incision area is bleeding, and the bleeding does not stop when you hold steady pressure on the area. Your arm or hand becomes pale, cool, tingly, or numb. These symptoms may represent a serious problem that is an emergency. Do not wait to see if the symptoms will go away. Get medical  help right away. Call your local emergency services (911 in the [place] Do not drive yourself to the hospital. Summary After the procedure, it is common to have bruising and tenderness at the incision site. Follow instructions from your health care provider about how to take care of your radial site incision. Check the incision every day for signs of infection. Do not lift anything that is heavier than 10 lb (4.5 kg), or the limit that you are told, until your health care provider says that it is  safe. Get help right away if the incision area swells very fast, you have bleeding at the incision site that will not stop, or your arm or hand becomes pale, cool, or numb. This information is not intended to replace advice given to you by your health care provider. Make sure you discuss any questions you have with your health care provider. Document Revised: 01/28/2021 Document Reviewed: 01/28/2021 Elsevier Patient Education  2024 [employer].

## 2023-11-26 ENCOUNTER — Encounter: Payer: Self-pay | Admitting: Endocrinology

## 2023-11-26 ENCOUNTER — Other Ambulatory Visit: Payer: Self-pay

## 2023-11-26 DIAGNOSIS — Z794 Long term (current) use of insulin: Secondary | ICD-10-CM

## 2023-11-26 MED ORDER — TRESIBA FLEXTOUCH 100 UNIT/ML ~~LOC~~ SOPN: PEN_INJECTOR | SUBCUTANEOUS | 0 refills | Status: DC

## 2023-11-26 MED ORDER — NOVOLOG FLEXPEN 100 UNIT/ML ~~LOC~~ SOPN: PEN_INJECTOR | SUBCUTANEOUS | 0 refills | Status: DC

## 2023-11-28 ENCOUNTER — Telehealth: Payer: Self-pay | Admitting: Cardiology

## 2023-11-28 ENCOUNTER — Other Ambulatory Visit: Payer: Self-pay | Admitting: Endocrinology

## 2023-11-28 ENCOUNTER — Ambulatory Visit: Payer: Self-pay | Admitting: Emergency Medicine

## 2023-11-28 ENCOUNTER — Other Ambulatory Visit: Payer: Self-pay | Admitting: Cardiology

## 2023-11-28 ENCOUNTER — Telehealth: Payer: Self-pay | Admitting: *Deleted

## 2023-11-28 DIAGNOSIS — I2510 Atherosclerotic heart disease of native coronary artery without angina pectoris: Secondary | ICD-10-CM

## 2023-11-28 DIAGNOSIS — E119 Type 2 diabetes mellitus without complications: Secondary | ICD-10-CM

## 2023-11-28 MED ORDER — GABAPENTIN 100 MG PO CAPS: 100.0000 mg | ORAL_CAPSULE | Freq: Every day | ORAL | 3 refills | Status: DC

## 2023-11-28 NOTE — Telephone Encounter (Signed)
Called spoke to patient per Dr Herbie Baltimore .   Patient has been schedule for PCI on 12/02/23.  Arranged by Dr Herbie Baltimore    RN went over  when patient to arrive at hospital   At 5:30 am 12/02/23  NPO after midnight - patient will not take any medication except  Aspirin 81 mg   Will take a 1/ 2 dose bedtime insulin.   Verbal  Instruction will be  sent via mychart   RN informed patient he may be receiving a call from  procedural  nurse navigator on Monday. Patient voiced understanding.

## 2023-11-28 NOTE — Heart Team MDD (Deleted)
   Heart Team Multi-Disciplinary Discussion  Patient: Nathan Duncan  DOB: 11-Dec-1951  MRN: [id number]   Date: 11/28/2023  1:12 PM    Attendees: Interventional Cardiology: Yates Decamp, MD Peter Swazil[place], MD Dorothyann Peng, MD Nanetta Batty, MD Lorine Bears, MD  Cardiothoracic Surgery: Brynda Greathouse, MD   Additional Attendees: Michele Rockers, MD   Patient History: 72 year old patient with a history of cardiac catheterization 35 years ago, diabetes, and recently diagnosed with heart failure. The patient's calcium score is 370. 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            Risk Factors: Diabetes Mellitus Hyperlipidemia   CAD History: Abnormal Cardiac CT Angiogram with CTO of LAD & FFR + lesions in RI & D1. Cath confirmed these findings.    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  Noted distal LAD filling via collaterals from RV marginal branch as well as large D1 branch.   Discussion: After presentation, consideration of treatment options occurred including CABG, PCI, and medical therapy. It was felt that a possible CABG would not be recommended due to limited symptoms. After extensive discussion the team consensus was to treat with PCI to ramus lesion and +/- diagonal lesion.    Recommendations: PCI      Alison Murray, RN  11/28/2023 8:29 AM     ATTENDING ATTESTATION  I attended the weekly Heart Team virtual conference discussing patient's Films.  His clinical presentation was provided by his primary cardiologist-Dr. Anne Fu.  We reviewed the images  as a group and the consensus was to recommend PCI of the ramus intermedius as well as the diagonal branch.  Will plan to contact the patient today with plans to schedule for PCI next week.  Tentatively will schedule for 12/02/2023.  Informed Consent   Shared Decision Making/Informed Consent The risks [stroke (1 in 1000), death (1 in 1000), kidney failure [usually temporary] (1 in 500), bleeding (1 in 200), allergic reaction [possibly serious] (1 in 200)], benefits (diagnostic support and management of coronary artery disease) and alternatives of a cardiac catheterization were discussed in detail with [name] and he is willing to proceed.      Bryan Lemma, MD  11/28/2023 1:12 PM

## 2023-11-28 NOTE — Telephone Encounter (Signed)
  Marykay Lex, MD Physician Cardiology   Heart Team MDD    Addendum   Date of Service: 11/25/2023 12:55 PM   Expand All Collapse All     Heart Team Multi-Disciplinary Discussion   Patient: Nathan Duncan  DOB: [birth year]  MRN: [id number]    Date: 11/28/2023  1:08 PM      Attendees: Interventional Cardiology: Yates Decamp, MD Peter Swaziland, MD Dorothyann Peng, MD Nanetta Batty, MD Lorine Bears, MD   Cardiothoracic Surgery: Brynda Greathouse, MD     Additional Attendees: Michele Rockers, MD     Patient History: 72 year old patient with a history of cardiac catheterization 35 years ago, diabetes, and recently diagnosed with heart failure. The patient's calcium score is 370. 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                Risk Factors: Diabetes Mellitus Hyperlipidemia     CAD History: Abnormal Cardiac CT Angiogram with CTO of LAD & FFR + lesions in RI & D1. Cath confirmed these findings.      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  Noted distal LAD filling via collaterals from RV marginal branch as well as large D1 branch.     Discussion: After presentation, consideration of treatment options occurred including CABG, PCI, and medical therapy. It was felt that a possible CABG would not be recommended due to limited symptoms. After extensive discussion the team consensus was to treat with PCI to ramus lesion and +/- diagonal lesion.      Recommendations: PCI         Alison Murray, RN  11/28/2023 8:29 AM       ATTENDING ATTESTATION   I  attended the weekly Heart Team virtual conference discussing patient's Films.  His clinical presentation was provided by his primary cardiologist-Dr. Anne Fu.  We reviewed the images as a group and the consensus was to recommend PCI of the ramus intermedius as well as the diagonal branch.   Will plan to contact the patient today with plans to schedule for PCI next week.  Tentatively will schedule for 12/02/2023.     Informed Consent Shared Decision Making/Informed Consent The risks [stroke (1 in 1000), death (1 in 1000), kidney failure [usually temporary] (1 in 500), bleeding (1 in 200), allergic reaction [possibly serious] (1 in 200)], benefits (diagnostic support and management of coronary artery disease) and alternatives of a cardiac catheterization were discussed in detail with Nathan Duncan and he is willing to proceed.         Bryan Lemma, MD  11/28/2023 1:08 PM       Revision History

## 2023-11-28 NOTE — Heart Team MDD (Addendum)
   Heart Team Multi-Disciplinary Discussion  Patient: Nathan Duncan  DOB: [birth year]  MRN: [id number]   Date: 11/28/2023  1:08 PM    Attendees: Interventional Cardiology: Yates Decamp, MD Peter Swazil[place], MD Dorothyann Peng, MD Nanetta Batty, MD Lorine Bears, MD  Cardiothoracic Surgery: Brynda Greathouse, MD   Additional Attendees: Michele Rockers, MD   Patient History: 72 year old patient with a history of cardiac catheterization 35 years ago, diabetes, and recently diagnosed with heart failure. The patient's calcium score is 370. 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            Risk Factors: Diabetes Mellitus Hyperlipidemia   CAD History: Abnormal Cardiac CT Angiogram with CTO of LAD & FFR + lesions in RI & D1. Cath confirmed these findings.    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  Noted distal LAD filling via collaterals from RV marginal branch as well as large D1 branch.   Discussion: After presentation, consideration of treatment options occurred including CABG, PCI, and medical therapy. It was felt that a possible CABG would not be recommended due to limited symptoms. After extensive discussion the team consensus was to treat with PCI to ramus lesion and +/- diagonal lesion.    Recommendations: PCI      Alison Murray, RN  11/28/2023 8:29 AM     ATTENDING ATTESTATION  I attended the weekly Heart Team virtual conference discussing patient's Films.  His clinical presentation was provided by his primary cardiologist-Dr. Anne Fu.  We reviewed the images  as a group and the consensus was to recommend PCI of the ramus intermedius as well as the diagonal branch.  Will plan to contact the patient today with plans to schedule for PCI next week.  Tentatively will schedule for 12/02/2023.  Informed Consent   Shared Decision Making/Informed Consent The risks [stroke (1 in 1000), death (1 in 1000), kidney failure [usually temporary] (1 in 500), bleeding (1 in 200), allergic reaction [possibly serious] (1 in 200)], benefits (diagnostic support and management of coronary artery disease) and alternatives of a cardiac catheterization were discussed in detail with [name] and he is willing to proceed.      Bryan Lemma, MD  11/28/2023 1:08 PM

## 2023-11-28 NOTE — Heart Team MDD (Signed)
   Heart Team Multi-Disciplinary Discussion  Patient: Nathan Duncan  DOB: September 28, 1951  MRN: [id number]   Date: 11/28/2023  8:29 AM    Attendees: Interventional Cardiology: Yates Decamp, MD Peter Swazil[place], MD Dorothyann Peng, MD Nanetta Batty, MD Lorine Bears, MD  Cardiothoracic Surgery: Brynda Greathouse, MD   Additional Attendees: Michele Rockers, MD   Patient History: 72 year old patient with a history of cardiac catheterization 35 years ago, diabetes, and recently diagnosed with heart failure. The patient's calcium score is 370. 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  Noted distal LAD filling via collaterals from RV marginal branch as well as large D1 branch.   Discussion: After presentation, consideration of treatment options occurred including CABG, PCI, and medical therapy. It was felt that a possible CABG would not be recommended due to limited symptoms. After extensive discussion the team consensus was to treat with PCI to ramus lesion and +/- diagonal lesion.    Recommendations: PCI      Alison Murray, RN  11/28/2023 8:29 AM

## 2023-12-01 ENCOUNTER — Telehealth: Payer: Self-pay | Admitting: *Deleted

## 2023-12-01 NOTE — Telephone Encounter (Addendum)
Coronary Stent  scheduled at [facility] for: Tuesday December 02, 2023 7:30 AM Arrival time [facility] Main Entrance A at: 5:30 AM  Nothing to eat after midnight prior to procedure, clear liquids until 5 AM day of procedure.  Medication instructions: -Hold:  Metformin-day of procedure and 48 hours post procedure  Glimepiride/Jardiance-AM of procedure  Insulin/Tresiba-AM of procedure- 1/2 usual Insulin dose HS prior to procedure  -Other usual morning medications can be taken with sips of water including aspirin 81 mg.  Plan to go home the same day, you will only stay overnight if medically necessary.  You must have responsible adult to drive you home.  Someone must be with you the first 24 hours after you arrive home.  Reviewed procedure instructions with patient.

## 2023-12-02 ENCOUNTER — Ambulatory Visit (HOSPITAL_COMMUNITY)
Admission: RE | Admit: 2023-12-02 | Discharge: 2023-12-02 | Disposition: A | Discharge status: Home / Self Care | Payer: Medicare Other | Attending: Cardiology | Admitting: Cardiology

## 2023-12-02 ENCOUNTER — Telehealth: Payer: Self-pay | Admitting: Cardiology

## 2023-12-02 ENCOUNTER — Other Ambulatory Visit: Payer: Self-pay

## 2023-12-02 ENCOUNTER — Telehealth (HOSPITAL_COMMUNITY): Payer: Self-pay | Admitting: Pharmacy Technician

## 2023-12-02 ENCOUNTER — Encounter (HOSPITAL_COMMUNITY): Payer: Self-pay | Admitting: Cardiology

## 2023-12-02 ENCOUNTER — Encounter (HOSPITAL_COMMUNITY)
Admission: RE | Disposition: A | Discharge status: Home / Self Care | Payer: Self-pay | Source: Home / Self Care | Attending: Cardiology

## 2023-12-02 ENCOUNTER — Other Ambulatory Visit (HOSPITAL_COMMUNITY): Payer: Self-pay

## 2023-12-02 DIAGNOSIS — Z955 Presence of coronary angioplasty implant and graft: Secondary | ICD-10-CM

## 2023-12-02 DIAGNOSIS — R079 Chest pain, unspecified: Secondary | ICD-10-CM | POA: Insufficient documentation

## 2023-12-02 DIAGNOSIS — E119 Type 2 diabetes mellitus without complications: Secondary | ICD-10-CM

## 2023-12-02 DIAGNOSIS — E781 Pure hyperglyceridemia: Secondary | ICD-10-CM | POA: Insufficient documentation

## 2023-12-02 DIAGNOSIS — Z87891 Personal history of nicotine dependence: Secondary | ICD-10-CM | POA: Insufficient documentation

## 2023-12-02 DIAGNOSIS — I2584 Coronary atherosclerosis due to calcified coronary lesion: Secondary | ICD-10-CM | POA: Diagnosis not present

## 2023-12-02 DIAGNOSIS — I25118 Atherosclerotic heart disease of native coronary artery with other forms of angina pectoris: Secondary | ICD-10-CM | POA: Insufficient documentation

## 2023-12-02 DIAGNOSIS — Z7984 Long term (current) use of oral hypoglycemic drugs: Secondary | ICD-10-CM | POA: Insufficient documentation

## 2023-12-02 DIAGNOSIS — E1142 Type 2 diabetes mellitus with diabetic polyneuropathy: Secondary | ICD-10-CM | POA: Diagnosis not present

## 2023-12-02 DIAGNOSIS — I700 Atherosclerosis of aorta: Secondary | ICD-10-CM | POA: Insufficient documentation

## 2023-12-02 DIAGNOSIS — R001 Bradycardia, unspecified: Secondary | ICD-10-CM | POA: Diagnosis not present

## 2023-12-02 DIAGNOSIS — E785 Hyperlipidemia, unspecified: Secondary | ICD-10-CM | POA: Diagnosis present

## 2023-12-02 DIAGNOSIS — I2510 Atherosclerotic heart disease of native coronary artery without angina pectoris: Secondary | ICD-10-CM

## 2023-12-02 HISTORY — PX: CORONARY STENT INTERVENTION: CATH118234

## 2023-12-02 LAB — CBC
HCT: 40.5 % (ref 39.0–52.0)
Hemoglobin: 13.8 g/dL (ref 13.0–17.0)
MCH: 29.3 pg (ref 26.0–34.0)
MCHC: 34.1 g/dL (ref 30.0–36.0)
MCV: 86 fL (ref 80.0–100.0)
Platelets: 217 10*3/uL (ref 150–400)
RBC: 4.71 MIL/uL (ref 4.22–5.81)
RDW: 12.6 % (ref 11.5–15.5)
WBC: 6.7 10*3/uL (ref 4.0–10.5)
nRBC: 0 % (ref 0.0–0.2)

## 2023-12-02 LAB — LIPID PANEL
Cholesterol: 144 mg/dL (ref 0–200)
HDL: 31 mg/dL — ABNORMAL LOW (ref >40)
LDL Cholesterol: 65 mg/dL (ref 0–99)
Total CHOL/HDL Ratio: 4.6 {ratio}
Triglycerides: 238 mg/dL — ABNORMAL HIGH (ref <150)
VLDL: 48 mg/dL — ABNORMAL HIGH (ref 0–40)

## 2023-12-02 LAB — POCT ACTIVATED CLOTTING TIME
Activated Clotting Time: 354 s
Activated Clotting Time: 262 s
Activated Clotting Time: 268 s

## 2023-12-02 LAB — BASIC METABOLIC PANEL
Anion gap: 9 (ref 5–15)
BUN: 36 mg/dL — ABNORMAL HIGH (ref 8–23)
CO2: 19 mmol/L — ABNORMAL LOW (ref 22–32)
Calcium: 9 mg/dL (ref 8.9–10.3)
Chloride: 107 mmol/L (ref 98–111)
Creatinine, Ser: 1.29 mg/dL — ABNORMAL HIGH (ref 0.61–1.24)
GFR, Estimated: 58 mL/min — ABNORMAL LOW (ref >60)
Glucose, Bld: 139 mg/dL — ABNORMAL HIGH (ref 70–99)
Potassium: 4.3 mmol/L (ref 3.5–5.1)
Sodium: 135 mmol/L (ref 135–145)

## 2023-12-02 LAB — GLUCOSE, CAPILLARY
Glucose-Capillary: 140 mg/dL — ABNORMAL HIGH (ref 70–99)
Glucose-Capillary: 124 mg/dL — ABNORMAL HIGH (ref 70–99)

## 2023-12-02 SURGERY — CORONARY STENT INTERVENTION
Anesthesia: LOCAL

## 2023-12-02 MED ORDER — SODIUM CHLORIDE 0.9% FLUSH: 3.0000 mL | INTRAVENOUS | Status: DC | PRN

## 2023-12-02 MED ORDER — ASPIRIN 81 MG PO CHEW: 81.0000 mg | CHEWABLE_TABLET | ORAL | Status: DC

## 2023-12-02 MED ORDER — SODIUM CHLORIDE 0.9 % IV SOLN: 250.0000 mL | INTRAVENOUS | Status: DC | PRN

## 2023-12-02 MED ORDER — SODIUM CHLORIDE 0.9 % IV SOLN: INTRAVENOUS | Status: AC

## 2023-12-02 MED ORDER — TICAGRELOR 90 MG PO TABS
180.0000 mg | ORAL_TABLET | Freq: Once | ORAL | Status: AC
  Administered 2023-12-02: 180 mg via ORAL
  Filled 2023-12-02 (×2): qty 2

## 2023-12-02 MED ORDER — FENTANYL CITRATE (PF) 100 MCG/2ML IJ SOLN
INTRAMUSCULAR | Status: DC | PRN
  Administered 2023-12-02: 25 ug via INTRAVENOUS

## 2023-12-02 MED ORDER — HEPARIN SODIUM (PORCINE) 1000 UNIT/ML IJ SOLN
INTRAMUSCULAR | Status: AC
  Filled 2023-12-02: qty 10

## 2023-12-02 MED ORDER — VERAPAMIL HCL 2.5 MG/ML IV SOLN
INTRAVENOUS | Status: AC
  Filled 2023-12-02: qty 2

## 2023-12-02 MED ORDER — LABETALOL HCL 5 MG/ML IV SOLN: 10.0000 mg | INTRAVENOUS | Status: DC | PRN

## 2023-12-02 MED ORDER — LIDOCAINE HCL (PF) 1 % IJ SOLN
INTRAMUSCULAR | Status: DC | PRN
  Administered 2023-12-02: 2 mL

## 2023-12-02 MED ORDER — LIDOCAINE HCL (PF) 1 % IJ SOLN
INTRAMUSCULAR | Status: AC
  Filled 2023-12-02: qty 30

## 2023-12-02 MED ORDER — MIDAZOLAM HCL 2 MG/2ML IJ SOLN
INTRAMUSCULAR | Status: AC
  Filled 2023-12-02: qty 2

## 2023-12-02 MED ORDER — ONDANSETRON HCL 4 MG/2ML IJ SOLN: 4.0000 mg | Freq: Four times a day (QID) | INTRAMUSCULAR | Status: DC | PRN

## 2023-12-02 MED ORDER — MIDAZOLAM HCL 2 MG/2ML IJ SOLN
INTRAMUSCULAR | Status: DC | PRN
  Administered 2023-12-02: 1 mg via INTRAVENOUS

## 2023-12-02 MED ORDER — NITROGLYCERIN 1 MG/10 ML FOR IR/CATH LAB
INTRA_ARTERIAL | Status: AC
  Filled 2023-12-02: qty 10

## 2023-12-02 MED ORDER — HEPARIN SODIUM (PORCINE) 1000 UNIT/ML IJ SOLN
INTRAMUSCULAR | Status: DC | PRN
  Administered 2023-12-02: 2000 [IU] via INTRAVENOUS
  Administered 2023-12-02: 7000 [IU] via INTRAVENOUS

## 2023-12-02 MED ORDER — VERAPAMIL HCL 2.5 MG/ML IV SOLN
INTRAVENOUS | Status: DC | PRN
  Administered 2023-12-02: 10 mL via INTRA_ARTERIAL

## 2023-12-02 MED ORDER — SODIUM CHLORIDE 0.9 % WEIGHT BASED INFUSION
3.0000 mL/kg/h | INTRAVENOUS | Status: AC
  Administered 2023-12-02: 3 mL/kg/h via INTRAVENOUS

## 2023-12-02 MED ORDER — SODIUM CHLORIDE 0.9% FLUSH
3.0000 mL | Freq: Two times a day (BID) | INTRAVENOUS | Status: DC
Start: 2023-12-02 — End: 2023-12-02

## 2023-12-02 MED ORDER — ACETAMINOPHEN 325 MG PO TABS
650.0000 mg | ORAL_TABLET | ORAL | Status: DC | PRN
Start: 2023-12-02 — End: 2023-12-02

## 2023-12-02 MED ORDER — HYDRALAZINE HCL 20 MG/ML IJ SOLN: 10.0000 mg | INTRAMUSCULAR | Status: DC | PRN

## 2023-12-02 MED ORDER — NITROGLYCERIN 0.4 MG SL SUBL
0.4000 mg | SUBLINGUAL_TABLET | SUBLINGUAL | 2 refills | Status: AC | PRN
  Filled 2023-12-02: qty 25, 7d supply, fill #0

## 2023-12-02 MED ORDER — IOHEXOL 350 MG/ML SOLN
INTRAVENOUS | Status: DC | PRN
  Administered 2023-12-02: 130 mL

## 2023-12-02 MED ORDER — SODIUM CHLORIDE 0.9 % WEIGHT BASED INFUSION: 1.0000 mL/kg/h | INTRAVENOUS | Status: DC

## 2023-12-02 MED ORDER — OMEGA-3-ACID ETHYL ESTERS 1 G PO CAPS
1.0000 g | ORAL_CAPSULE | Freq: Two times a day (BID) | ORAL | 11 refills | Status: DC
  Filled 2023-12-02 – 2023-12-23 (×2): qty 60, 30d supply, fill #0

## 2023-12-02 MED ORDER — HEPARIN (PORCINE) IN NACL 1000-0.9 UT/500ML-% IV SOLN
INTRAVENOUS | Status: DC | PRN
  Administered 2023-12-02 (×2): 500 mL

## 2023-12-02 MED ORDER — TICAGRELOR 90 MG PO TABS
90.0000 mg | ORAL_TABLET | Freq: Two times a day (BID) | ORAL | 1 refills | Status: DC
  Filled 2023-12-02 – 2023-12-23 (×2): qty 60, 30d supply, fill #0

## 2023-12-02 MED ORDER — FENTANYL CITRATE (PF) 100 MCG/2ML IJ SOLN
INTRAMUSCULAR | Status: AC
  Filled 2023-12-02: qty 2

## 2023-12-02 SURGICAL SUPPLY — 21 items
BALLN EMERGE MR 2.25X12 (BALLOONS) ×1
BALLN SAPPHIRE 2.75X20 (BALLOONS) ×1
BALLOON EMERGE MR 2.25X12 (BALLOONS) IMPLANT
BALLOON SAPPHIRE 2.75X20 (BALLOONS) IMPLANT
CATH VISTA GUIDE 6FR XBLAD3.5 (CATHETERS) IMPLANT
DEVICE RAD COMP TR BAND LRG (VASCULAR PRODUCTS) IMPLANT
GLIDESHEATH SLEND SS 6F .021 (SHEATH) IMPLANT
GUIDEWIRE INQWIRE 1.5J.035X260 (WIRE) IMPLANT
INQWIRE 1.5J .035X260CM (WIRE) ×1
KIT ENCORE 26 ADVANTAGE (KITS) IMPLANT
KIT SYRINGE INJ CVI SPIKEX1 (MISCELLANEOUS) IMPLANT
PACK CARDIAC CATHETERIZATION (CUSTOM PROCEDURE TRAY) ×1 IMPLANT
SET ATX-X65L (MISCELLANEOUS) IMPLANT
SHEATH PROBE COVER 6X72 (BAG) IMPLANT
STENT SYNERGY XD 2.25X20 (Permanent Stent) IMPLANT
STENT SYNERGY XD 2.50X48 (Permanent Stent) IMPLANT
SYNERGY XD 2.25X20 (Permanent Stent) ×1 IMPLANT
SYNERGY XD 2.50X48 (Permanent Stent) ×1 IMPLANT
TUBING CIL FLEX 10 FLL-RA (TUBING) IMPLANT
WIRE ASAHI PROWATER 300CM (WIRE) IMPLANT
WIRE RUNTHROUGH .014X180CM (WIRE) IMPLANT

## 2023-12-02 NOTE — Telephone Encounter (Signed)
   Transition of Care Follow-up Phone Call Request    Patient Name: Nathan Duncan Date of Birth: Oct 12, 1951 Date of Encounter: 12/02/2023  Primary Care Provider:  Koren Shiver, DO Primary Cardiologist:  Donato Schultz, MD  Nathan Duncan has been scheduled for a transition of care follow up appointment with a [facility] provider:  Robin Searing 1/3  Please reach out to Nathan Duncan within 48 hours of discharge to confirm appointment and review transition of care protocol questionnaire. Anticipated discharge date: 12/10  Nathan Page, NP  12/02/2023, 1:22 PM

## 2023-12-02 NOTE — Progress Notes (Signed)
TR BAND REMOVAL  LOCATION:    right radial  DEFLATED PER PROTOCOL:    Yes.    TIME BAND OFF / DRESSING APPLIED:    1035 Gauze dressing applied   SITE UPON ARRIVAL:    Level 0  SITE AFTER BAND REMOVAL:    Level 0  CIRCULATION SENSATION AND MOVEMENT:    Within Normal Limits   Yes.    COMMENTS:   no issues noted

## 2023-12-02 NOTE — Telephone Encounter (Signed)
Left voicemail to return call to office.

## 2023-12-02 NOTE — Telephone Encounter (Signed)
Pharmacy Patient Advocate Encounter   Received notification  that prior authorization for Omega-3-acid Ethyl Esters 1GM capsules is required/requested.   Insurance verification completed.   The patient is insured through [facility] .   Per test claim: PA required; PA submitted to above mentioned insurance via CoverMyMeds Key/confirmation #/EOC [id number] Status is pending

## 2023-12-02 NOTE — Progress Notes (Signed)
CARDIAC REHAB PHASE I     Post stent education including site care, restrictions, risk factors, exercise guidelines, NTG use, antiplatelet therapy importance, heart healthy diabetic diet and CRP2 reviewed. All questions and concerns addressed. Will refer to [facility] for CRP2. Plan for home later today.    [id number] Woodroe Chen, RN BSN 12/02/2023 12:29 PM

## 2023-12-02 NOTE — Telephone Encounter (Addendum)
Patient Product/process development scientist completed.    The patient is insured through [employer]. Patient has Medicare and is not eligible for a copay card, but may be able to apply for patient assistance, if available.    Ran test claim for Brilinta 90 mg and the current 30 day co-pay is $64.22.  Ran test claim for omega-3 acid ethyl esters (Lovaza) 1 g and Requires Prior Authorization  This test claim was processed through [facility]- copay amounts may vary at other pharmacies due to [employer], or as the patient moves through the different stages of their insurance plan.     Roland Earl, CPHT Pharmacy Technician III Certified Patient Advocate [facility] Patient Advocate Team Direct Number: [phone]  Fax: [phone]

## 2023-12-02 NOTE — Discharge Summary (Signed)
Discharge Summary for Same Day PCI   Patient ID: Nathan Duncan MRN: [id number]; DOB: 1951/01/18  Admit date: 12/02/2023 Discharge date: 12/02/2023  Primary Care Provider: Koren Shiver, DO  Primary Cardiologist: Donato Schultz, MD  Primary Electrophysiologist:  None   Discharge Diagnoses    Principal Problem:   CAD (coronary artery disease) Active Problems:   Dyslipidemia   Chest pain   Diagnostic Studies/Procedures    Cardiac Catheterization 12/02/2023:    Lesion #1: Ramus lesion is 85% stenosed.   A drug-eluting stent was successfully placed using a SYNERGY XD 2.25X20-postdilated to 2.4 mm..   Post intervention, there is a 0% residual stenosis.   ----------------------------------------------------------   Lesion Segment #2: Prox LAD lesion is 65% stenosed.  Prox LAD to Mid LAD lesion is 60% stenosed with 60% stenosed side branch in 1st Diag. 1st Diag lesion is 70% stenosed with 60% stenosed side branch in Lat 1st Diag.   A drug-eluting stent was successfully placed (from the proximal LAD across into 1st Diag, and crossing the  Lat 1st Diag into the mid Diag using a SYNERGY XD 2.50X48=> postdilated and taper fashion from 3.1 to 2.7 mm.   Post intervention, there is a 0% residual stenosis throughout the stented segment. Post intervention, the Lateral 1st Diag side branch was stable at 60% residual stenosis.   -----------------------   Mid LAD lesion is 100% stenosed. Mid LAD to Dist LAD lesion is 99% stenosed.  (With distal left to left collateralization filling the apical LAD.   Successful two-vessel PCI: Ostial-proximal RI 85% reduced to 0%: Synergy XD 2.25 mm x 20 mm deployed to 2.4 mm; TIMI-3 flow preserved Ostial and proximal LAD into D1 (65-70% reduced to 0%): Synergy XD 2.5 mg x 48 mm postdilated 3.0 to 2.7 mm; TIMI-3 flow preserved .     Recommendations:   In the absence of any other complications or medical issues, we expect the patient to be ready for  discharge from an interventional cardiology perspective on 12/02/2023.   He will follow-up with Dr. Donato Schultz   Recommend uninterrupted dual antiplatelet therapy with Aspirin 81mg  daily and Ticagrelor 90mg  twice daily for a minimum of 6 months (stable ischemic heart disease-Class I recommendation).   Would be okay to stop aspirin at 6 months and continue Brilinta/ticagrelor to complete 1 year at treatment dose 90 mg twice daily.   Based on length of stent in LAD, would recommend maintenance dose Brilinta 60 mg twice daily to complete year to as monotherapy (SAPT)   Bryan Lemma, MD  Diagnostic Dominance: Right  Intervention   _____________   History of Present Illness     Nathan Duncan is a 72 y.o. male with PMH of CAD on coronary CT, aortic atherosclerosis, DM, HLD who was referred to cardiology for chest pain. Underwent outpatient coronary CT on 11/10/2023 that showed a calcium score of 370, a subtotally occluded mid LAD involving the origin of a large first diagonal, and obstructive coronary disease in obtuse marginal one. He was seen back in the office on 11/22 with recommendations for cardiac cath discussed.   Hospital Course     The patient underwent cardiac cath as noted above with successful 2v PCI with o/pRI treated with DES x1, and o/pLAD treated with DES x1. Plan for DAPT with ASA/Brilinta for at least six months, then Brilinta 90mg  daily to total one year. The patient was seen by cardiac rehab while in short stay. There were no observed complications post  cath. Radial cath site was re-evaluated prior to discharge and found to be stable without any complications. Instructions/precautions regarding cath site care were given prior to discharge.  Nathan Duncan was seen by Dr. Herbie Baltimore and determined stable for discharge home. Follow up with our office has been arranged. Medications are listed below. Pertinent changes include addition of Brilinta, SL  NTG. _____________  Cath/PCI Registry Performance & Quality Measures: Aspirin prescribed? - Yes ADP Receptor Inhibitor (Plavix/Clopidogrel, Brilinta/Ticagrelor or Effient/Prasugrel) prescribed (includes medically managed patients)? - Yes High Intensity Statin (Lipitor 40-80mg  or Crestor 20-40mg ) prescribed? - Yes For EF <40%, was ACEI/ARB prescribed? - Not Applicable (EF >/= 40%) For EF <40%, Aldosterone Antagonist (Spironolactone or Eplerenone) prescribed? - Not Applicable (EF >/= 40%) Cardiac Rehab Phase II ordered (Included Medically managed Patients)? - Yes  _____________   Discharge Vitals Blood pressure 116/66, pulse (!) 59, temperature 97.7 F (36.5 C), temperature source Oral, resp. rate 13, height 5\' 11"  (1.803 m), weight 84.8 kg, SpO2 97%.  Filed Weights   12/02/23 0544  Weight: 84.8 kg    Last Labs & Radiologic Studies    CBC Recent Labs    12/02/23 0625  WBC 6.7  HGB 13.8  HCT 40.5  MCV 86.0  PLT 217   Basic Metabolic Panel Recent Labs    29/52/84 0625  NA 135  K 4.3  CL 107  CO2 19*  GLUCOSE 139*  BUN 36*  CREATININE 1.29*  CALCIUM 9.0   Liver Function Tests No results for input(s): "AST", "ALT", "ALKPHOS", "BILITOT", "PROT", "ALBUMIN" in the last 72 hours. No results for input(s): "LIPASE", "AMYLASE" in the last 72 hours. High Sensitivity Troponin:   No results for input(s): "TROPONINIHS" in the last 720 hours.  BNP Invalid input(s): "POCBNP" D-Dimer No results for input(s): "DDIMER" in the last 72 hours. Hemoglobin A1C No results for input(s): "HGBA1C" in the last 72 hours. Fasting Lipid Panel Recent Labs    12/02/23 0630  CHOL 144  HDL 31*  LDLCALC 65  TRIG 132*  CHOLHDL 4.6   Thyroid Function Tests No results for input(s): "TSH", "T4TOTAL", "T3FREE", "THYROIDAB" in the last 72 hours.  Invalid input(s): "FREET3" _____________  CARDIAC CATHETERIZATION  Result Date: 12/02/2023   Lesion #1: Ramus lesion is 85% stenosed.   A  drug-eluting stent was successfully placed using a SYNERGY XD 2.25X20-postdilated to 2.4 mm..   Post intervention, there is a 0% residual stenosis.   ----------------------------------------------------------   Lesion Segment #2: Prox LAD lesion is 65% stenosed.  Prox LAD to Mid LAD lesion is 60% stenosed with 60% stenosed side branch in 1st Diag. 1st Diag lesion is 70% stenosed with 60% stenosed side branch in Lat 1st Diag.   A drug-eluting stent was successfully placed (from the proximal LAD across into 1st Diag, and crossing the  Lat 1st Diag into the mid Diag using a SYNERGY XD 2.50X48=> postdilated and taper fashion from 3.1 to 2.7 mm.   Post intervention, there is a 0% residual stenosis throughout the stented segment. Post intervention, the Lateral 1st Diag side branch was stable at 60% residual stenosis.   -----------------------   Mid LAD lesion is 100% stenosed. Mid LAD to Dist LAD lesion is 99% stenosed.  (With distal left to left collateralization filling the apical LAD. Successful two-vessel PCI: Ostial-proximal RI 85% reduced to 0%: Synergy XD 2.25 mm x 20 mm deployed to 2.4 mm; TIMI-3 flow preserved Ostial and proximal LAD into D1 (65-70% reduced to 0%): Synergy XD 2.5  mg x 48 mm postdilated 3.0 to 2.7 mm; TIMI-3 flow preserved . Recommendations:   In the absence of any other complications or medical issues, we expect the patient to be ready for discharge from an interventional cardiology perspective on 12/02/2023.   He will follow-up with Dr. Donato Schultz   Recommend uninterrupted dual antiplatelet therapy with Aspirin 81mg  daily and Ticagrelor 90mg  twice daily for a minimum of 6 months (stable ischemic heart disease-Class I recommendation).   Would be okay to stop aspirin at 6 months and continue Brilinta/ticagrelor to complete 1 year at treatment dose 90 mg twice daily.   Based on length of stent in LAD, would recommend maintenance dose Brilinta 60 mg twice daily to complete year to as monotherapy  (SAPT) Bryan Lemma, MD  CT CORONARY [facility] W/CTA COR W/SCORE Vallarie Mare W/CM &/OR WO/CM  Addendum Date: 11/27/2023   ADDENDUM REPORT: 11/27/2023 13:11 EXAM: OVER-READ INTERPRETATION  CT CHEST The following report is an over-read performed by radiologist Dr. Curly Shores [facility] on 11/27/2023. This over-read does not include interpretation of cardiac or coronary anatomy or pathology. The coronary CTA interpretation by the cardiologist is attached. COMPARISON:  None. FINDINGS: Cardiovascular:  See findings discussed in the body of the report. Mediastinum/Nodes: No suspicious adenopathy identified. Imaged mediastinal structures are unremarkable. Lungs/Pleura: Imaged lungs are clear. No pleural effusion or pneumothorax. Upper Abdomen: No acute abnormality. Musculoskeletal: No chest wall abnormality. No acute osseous findings. There are thoracic degenerative changes. IMPRESSION: No acute extracardiac incidental findings. Electronically Signed   By: Layla Maw M.D.   On: 11/27/2023 13:11   Result Date: 11/27/2023 CLINICAL DATA:  Chest pain EXAM: Cardiac CTA MEDICATIONS: Sub lingual nitro.  4mg  TECHNIQUE: The patient was scanned on a Siemens Force 192 slice scanner. Gantry rotation speed was 250 msecs. Collimation was .6 mm. A 120 kV prospective scan was triggered in the ascending thoracic aorta at 140 HU's Full mA was used between 35% and 75% of the R-R interval. Average HR during the scan was 48 bpm. The 3D data set was interpreted on a dedicated work station using MPR, MIP and VRT modes. A total of 80 cc of contrast was used. FINDINGS: Non-cardiac: See separate report from [facility]. No significant findings on limited lung and soft tissue windows. Calcium Score: 3 vessel calcium noted LM 0 LAD 153 LCX 29.5 RCA 188 Total 370 Coronary Arteries: Right dominant with no anomalies LM: Normal LAD: 50-69% calcified plaque proximally, 50-69% calcified plaque mid vessel subtotally occluded  after take off of large D1 D1: Ostium involved with obstructive plaque in LAD Circumflex: Normal OM1: High take off > 70% mixed plaque in proximal and mid vessel OM2: Normal OM3: Normal RCA: 25-49% calcified plaque proximally, 1-24% calcified plaque mid vessel PDA: Normal PLA: Normal Two large RV branches present IMPRESSION: 1. Calcium score 370 which is 60 th percentile for age/sex 2.  Normal ascending thoracic aorta 3.5 cm 3. CAD RADS 5? Subtotally occluded mid LAD involving origin of large D1 Possible obstructive CAD in OM1 study sent for [facility] Charlton Haws Electronically Signed: By: Charlton Haws M.D. On: 11/10/2023 13:46   CARDIAC CATHETERIZATION  Addendum Date: 11/25/2023     Prox RCA lesion is 20% stenosed.  Mid RCA lesion is 30% stenosed.   Prox LAD to Mid LAD lesion is 60% stenosed with 60% stenosed side branch in 1st Diag.  Prox LAD lesion is 65% stenosed. Mid LAD lesion is 100% stenosed. Mid LAD to Dist LAD lesion  is 99% stenosed. ->  Distal LAD fills via left to left and right left collaterals   1st Diag lesion is 70% stenosed with 60% stenosed side branch in Lat 1st Diag.   Ramus lesion is 85% stenosed.   LV end diastolic pressure is normal.   There is no aortic valve stenosis.   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  Distal LAD fills via collaterals from RV marginal branch as well as large D1 branch. RECOMMENDATIONS   In the absence of any other complications or medical issues, we expect the patient to be ready for discharge from a cath perspective on 11/25/2023.   Plan will be to heart team discussion on Friday morning 11/28/2023 -> to determine best course of action of PCI versus CABG.  We will notify the patient and family that afternoon of the decision.   Will need to determine best course of action of PCI versus CABG.  Hold off on antiplatelet until that  decision is made. Bryan Lemma, MD   Result Date: 11/25/2023   Prox RCA lesion is 20% stenosed.  Mid RCA lesion is 30% stenosed.   Prox LAD to Mid LAD lesion is 60% stenosed with 60% stenosed side branch in 1st Diag.  Prox LAD lesion is 65% stenosed. Mid LAD lesion is 100% stenosed. Mid LAD to Dist LAD lesion is 99% stenosed. ->  Distal LAD fills via left to left and right left collaterals   1st Diag lesion is 70% stenosed with 60% stenosed side branch in Lat 1st Diag.   Ramus lesion is 85% stenosed.   LV end diastolic pressure is normal.   There is no aortic valve stenosis.   In the absence of any other complications or medical issues, we expect the patient to be ready for discharge from a cath perspective on 11/25/2023.   Plan will be to heart team discussion on Friday morning 11/28/2023 -> to determine best course of action of PCI versus CABG.  We will notify the patient and family that afternoon of the decision.   Will need to determine best course of action of PCI versus CABG.  Hold off on antiplatelet until that decision is made.   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  Distal LAD fills via collaterals from RV marginal branch as well as large D1 branch. RECOMMENDATIONS   In the absence of any other complications or medical issues, we expect the patient to be ready for discharge from a cath perspective on 11/25/2023.   Plan will be to heart team discussion on Friday morning 11/28/2023 -> to determine best course of action of PCI versus CABG.  We will notify the patient and family that afternoon of the decision.   Will need to determine best course of action of PCI versus CABG.  Hold off on antiplatelet until that decision is made.   Normal LVEDP with normal EF by echo   ECHOCARDIOGRAM COMPLETE  Result Date: 11/17/2023    ECHOCARDIOGRAM REPORT   Patient Name:   Nathan Duncan Date of Exam: 11/17/2023 Medical Rec #:  [id number]         Height:       71.0 in Accession #:    [id number]  Weight:       192.0 lb Date of Birth:  09-Jul-1951         BSA:          2.072 m Patient Age:    72 years          BP:           130/70 mmHg Patient Gender: M                 HR:           59 bpm. Exam Location:  [address] Procedure: 2D Echo, Cardiac Doppler and Color Doppler Indications:    R07.9 Chest pain  History:        Patient has no prior history of Echocardiogram examinations.                 CAD, Signs/Symptoms:Chest Pain; Risk Factors:Diabetes and Former                 Smoker.  Sonographer:    Samule Ohm RDCS Referring Phys: [id number] MARK C SKAINS IMPRESSIONS  1. Left ventricular ejection fraction, by estimation, is 60 to 65%. The left ventricle has normal function. The left ventricle has no regional wall motion abnormalities. There is mild concentric left ventricular hypertrophy. Left ventricular diastolic parameters are consistent with Grade I diastolic dysfunction (impaired relaxation).  2. Right ventricular systolic function is normal. The right ventricular size is normal.  3. Left atrial size was mildly dilated.  4. The mitral valve is normal in structure. Trivial mitral valve regurgitation. No evidence of mitral stenosis.  5. The aortic valve is tricuspid. There is mild calcification of the aortic valve. Aortic valve regurgitation is not visualized. No aortic stenosis is present.  6. The inferior vena cava is normal in size with greater than 50% respiratory variability, suggesting right atrial pressure of 3 mmHg. FINDINGS  Left Ventricle: Left ventricular ejection fraction, by estimation, is 60 to 65%. The left ventricle has normal function. The left ventricle has no regional wall motion abnormalities. The left ventricular internal cavity size was normal in size. There is  mild concentric left ventricular hypertrophy. Left ventricular diastolic parameters are consistent  with Grade I diastolic dysfunction (impaired relaxation). Right Ventricle: The right ventricular size is normal. No increase in right ventricular wall thickness. Right ventricular systolic function is normal. Left Atrium: Left atrial size was mildly dilated. Right Atrium: Right atrial size was normal in size. Pericardium: There is no evidence of pericardial effusion. Mitral Valve: The mitral valve is normal in structure. Trivial mitral valve regurgitation. No evidence of mitral valve stenosis. Tricuspid Valve: The tricuspid valve is normal in structure. Tricuspid valve regurgitation is trivial. No evidence of tricuspid stenosis. Aortic Valve: The aortic valve is tricuspid. There is mild calcification of the aortic valve. Aortic valve regurgitation is not visualized. No aortic stenosis is present. Pulmonic Valve: The pulmonic valve was normal in structure. Pulmonic valve regurgitation is trivial. No evidence of pulmonic stenosis. Aorta: The aortic root is normal in size and structure. Venous: The inferior vena cava is normal in size with greater than 50% respiratory variability, suggesting right atrial pressure of 3 mmHg. IAS/Shunts: No atrial level shunt detected by color flow Doppler.  LEFT VENTRICLE PLAX 2D LVIDd:         3.70 cm   Diastology LVIDs:         2.50 cm   LV e' medial:    9.14 cm/s LV PW:  1.40 cm   LV E/e' medial:  10.9 LV IVS:        1.30 cm   LV e' lateral:   10.13 cm/s LVOT diam:     2.00 cm   LV E/e' lateral: 9.9 LV SV:         68 LV SV Index:   33 LVOT Area:     3.14 cm  RIGHT VENTRICLE             IVC RV S prime:     12.40 cm/s  IVC diam: 1.10 cm TAPSE (M-mode): 1.9 cm RVSP:           25.3 mmHg LEFT ATRIUM             Index        RIGHT ATRIUM           Index LA diam:        4.30 cm 2.07 cm/m   RA Pressure: 3.00 mmHg LA Vol (A2C):   48.3 ml 23.31 ml/m  RA Area:     14.90 cm LA Vol (A4C):   43.1 ml 20.80 ml/m  RA Volume:   39.10 ml  18.87 ml/m LA Biplane Vol: 48.6 ml 23.45 ml/m   AORTIC VALVE LVOT Vmax:   96.00 cm/s LVOT Vmean:  64.267 cm/s LVOT VTI:    0.218 m  AORTA Ao Root diam: 3.20 cm Ao Asc diam:  3.50 cm MITRAL VALVE                TRICUSPID VALVE MV Area (PHT): 3.31 cm     TR Peak grad:   22.3 mmHg MV Decel Time: 229 msec     TR Vmax:        236.00 cm/s MV E velocity: 100.04 cm/s  Estimated RAP:  3.00 mmHg MV A velocity: 99.14 cm/s   RVSP:           25.3 mmHg MV E/A ratio:  1.01                             SHUNTS                             Systemic VTI:  0.22 m                             Systemic Diam: 2.00 cm Arvilla Meres MD Electronically signed by Arvilla Meres MD Signature Date/Time: 11/17/2023/8:41:51 AM    Final    CT CORONARY FFR DATA PREP & FLUID ANALYSIS  Result Date: 11/10/2023 CLINICAL DATA:  CAD EXAM: FFR CT TECHNIQUE: The best systolic and diastolic phases of the patients gated cardiac CTA sent to [facility] for hemodynamic analysis FINDINGS: RCA normal LAD: Modeled as CTA Large diagonal branch positive 0.75 Large OM1 positive 0.73 IMPRESSION: FFR positive modeled as CTO LAD with obstructive disease in large D1 and OM1 Charlton Haws Electronically Signed   By: Charlton Haws M.D.   On: 11/10/2023 14:00    Disposition   Pt is being discharged home today in good condition.  Follow-up Plans & Appointments     Discharge Instructions     Amb Referral to Cardiac Rehabilitation   Complete by: As directed    Diagnosis: Coronary Stents   After initial evaluation and assessments completed: Virtual Based Care may be provided alone  or in conjunction with Phase 2 Cardiac Rehab based on patient barriers.: Yes   Intensive Cardiac Rehabilitation (ICR) [facility] location only OR Traditional Cardiac Rehabilitation (TCR) *If criteria for ICR are not met will enroll in TCR [facility] only): Yes        Discharge Medications   Allergies as of 12/02/2023       Reactions   Seasonal Ic [octacosanol] Other (See Comments)   Congestion   Semaglutide    REFLUX         Medication List     TAKE these medications    aspirin EC 81 MG tablet Take 81 mg by mouth at bedtime.   atorvastatin 40 MG tablet Commonly known as: LIPITOR Take 40 mg by mouth daily.   B-D UF III MINI PEN NEEDLES 31G X 5 MM Misc Generic drug: Insulin Pen Needle USE WITH INSULIN PEN/uses 3 to 4 a day   Brilinta 90 MG Tabs tablet Generic drug: ticagrelor Take 1 tablet (90 mg total) by mouth 2 (two) times daily.   empagliflozin 25 MG Tabs tablet Commonly known as: Jardiance Take 1 tablet (25 mg total) by mouth daily.   gabapentin 100 MG capsule Commonly known as: NEURONTIN Take 1 capsule (100 mg total) by mouth at bedtime.   glimepiride 2 MG tablet Commonly known as: AMARYL Take 1 tablet (2 mg total) by mouth daily with breakfast.   lisinopril 5 MG tablet Commonly known as: ZESTRIL Take 5 mg by mouth daily.   metFORMIN 500 MG 24 hr tablet Commonly known as: GLUCOPHAGE-XR TAKE 4 TABLETS BY MOUTH ONCE DAILY WITH BREAKFAST What changed: See the new instructions.   nitroGLYCERIN 0.4 MG SL tablet Commonly known as: Nitrostat Place 1 tablet (0.4 mg total) under the tongue every 5 (five) minutes as needed.   NovoLOG FlexPen 100 UNIT/ML FlexPen Generic drug: insulin aspart Inject 3 to 4 units into skin 3 times daily before meals   omega-3 acid ethyl esters 1 g capsule Commonly known as: Lovaza Take 1 capsule (1 g total) by mouth 2 (two) times daily.   Evaristo Bury FlexTouch 100 UNIT/ML FlexTouch Pen Generic drug: insulin degludec Inject once daily, adjust as directed with max dose 25 units           Allergies Allergies  Allergen Reactions   Seasonal Ic [Octacosanol] Other (See Comments)    Congestion   Semaglutide     REFLUX     Outstanding Labs/Studies   N/a   Duration of Discharge Encounter   Greater than 30 minutes including physician time.  Signed, Laverda Page, NP 12/02/2023, 2:35 PM

## 2023-12-02 NOTE — Interval H&P Note (Signed)
History and Physical Interval Note:  12/02/2023 7:27 AM  Patient: Nathan Duncan  DOB: 12/15/1951  MRN: [id number]    Date: 11/28/2023  1:08 PM      Attendees: Interventional Cardiology: Nathan Decamp, MD Nathan Swaziland, MD Nathan Peng, MD Nathan Batty, MD Nathan Bears, MD   Cardiothoracic Surgery: Nathan Greathouse, MD     Additional Attendees: Nathan Rockers, MD     Patient History: 72 year old patient with a history of cardiac catheterization 35 years ago, diabetes, and recently diagnosed with heart failure. The patient's calcium score is 370. 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                Risk Factors: Diabetes Mellitus Hyperlipidemia     CAD History: Abnormal Cardiac CT Angiogram with CTO of LAD & FFR + lesions in RI & D1. Cath confirmed these findings.      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  Noted distal LAD filling via collaterals from RV marginal branch as well as large D1 branch.     Discussion: After presentation, consideration of treatment options occurred including CABG, PCI, and medical therapy. It was felt that a possible CABG would not be recommended due to limited symptoms. After extensive discussion the team consensus was to treat with PCI to ramus lesion and +/- diagonal lesion.      Recommendations: PCI         Nathan Murray, RN  11/28/2023 8:29 AM       ATTENDING ATTESTATION   I attended the weekly Heart Team virtual conference discussing patient's Films.  His clinical presentation was provided by his primary  cardiologist-Dr. Anne Duncan.  We reviewed the images as a group and the consensus was to recommend PCI of the ramus intermedius as well as the diagonal branch.   Will plan to contact the patient today with plans to schedule for PCI next week.  Tentatively will schedule for 12/02/2023.     Informed Consent Shared Decision Making/Informed Consent The risks [stroke (1 in 1000), death (1 in 1000), kidney failure [usually temporary] (1 in 500), bleeding (1 in 200), allergic reaction [possibly serious] (1 in 200)], benefits (diagnostic support and management of coronary artery disease) and alternatives of a cardiac catheterization were discussed in detail with Nathan Duncan and he is willing to proceed.      Nathan Lemma, MD  11/28/2023 1:08 PM    Nathan Duncan  has presented today for surgery, with the diagnosis of cad.  The various methods of treatment have been discussed with the patient and family. After consideration of risks, benefits and other options for treatment, the patient has consented to  Procedure(s): CORONARY STENT INTERVENTION (N/A) as a surgical intervention.  The patient's history has been reviewed, patient examined, no change in status, stable for surgery.  I have reviewed the patient's chart and labs.  Questions were answered to the patient's satisfaction.     Nathan Duncan  Cath Lab Visit (complete for each Cath Lab visit)  Clinical Evaluation Leading to the Procedure:   ACS: No.  Non-ACS:    Anginal Classification: CCS III  Anti-ischemic medical therapy: Minimal Therapy (1 class of medications)  Non-Invasive Test Results: High-risk stress test findings: cardiac mortality >3%/year  Prior CABG: No previous CABG   Nathan Lemma, MD

## 2023-12-02 NOTE — Discharge Instructions (Signed)

## 2023-12-03 ENCOUNTER — Telehealth: Payer: Self-pay | Admitting: Cardiology

## 2023-12-03 NOTE — Telephone Encounter (Signed)
Pharmacy Patient Advocate Encounter  Received notification from [facility] that Prior Authorization for Omega-3-acid Ethyl Esters 1GM capsules  has been DENIED.  Full denial letter will be uploaded to the media tab. See denial reason below. Your plan does not allow coverage of this medication based on your prescriber answering No to the following  question(s): Prior to the start of treatment with a triglyceride lowering drug, has/had the patient's pretreatment triglyceride level been greater than or equal to 500 mg/dL?  PA #/Case ID/Reference #: [id number]

## 2023-12-03 NOTE — Telephone Encounter (Signed)
Patient contacted regarding discharge from [facility] on 12/02/23.  Patient understands to follow up with provider Robin Searing, NP on December 26, 2023 at 8:50 pm at [address]. Patient understands discharge instructions? Yes Patient understands medications and regiment? Yes Patient understands to bring all medications to this visit? Yes

## 2023-12-03 NOTE — Telephone Encounter (Signed)
Patient was returning phone call and also says that the patient has a huge bruise on him stomach from having procedure.

## 2023-12-03 NOTE — Telephone Encounter (Signed)
Called pt for TOC; pt reports attempted to reach our office d/t black and blue area to abdomen about the size of a grapefruit.  Gives himself insulin on the right and left lower part of abdomen. Noticed at 4 pm black and blue areas to abdomen.  Pt denies pain/ tenderness.  Recently released from [facility] s/p coronary artery stenting X2.  Advised pt he was more than likely given Heparin which thins the blood and bruising is common with this med.  Advised to continue to monitor if size of bruising increases or becomes tender or if begins to notice more bruising to body to call back.  Pt expresses understanding.

## 2023-12-05 ENCOUNTER — Other Ambulatory Visit (HOSPITAL_COMMUNITY): Payer: Self-pay

## 2023-12-09 ENCOUNTER — Telehealth (HOSPITAL_COMMUNITY): Payer: Self-pay

## 2023-12-09 NOTE — Telephone Encounter (Signed)
Called patient to see if he is interested in the Cardiac Rehab Program. Patient expressed interest. Explained scheduling process and went over insurance, patient verbalized understanding. Will contact patient for scheduling once f/u has been completed.  °

## 2023-12-11 DIAGNOSIS — L538 Other specified erythematous conditions: Secondary | ICD-10-CM | POA: Diagnosis not present

## 2023-12-11 DIAGNOSIS — Z789 Other specified health status: Secondary | ICD-10-CM | POA: Diagnosis not present

## 2023-12-11 DIAGNOSIS — L821 Other seborrheic keratosis: Secondary | ICD-10-CM | POA: Diagnosis not present

## 2023-12-11 DIAGNOSIS — Z85828 Personal history of other malignant neoplasm of skin: Secondary | ICD-10-CM | POA: Diagnosis not present

## 2023-12-11 DIAGNOSIS — Z08 Encounter for follow-up examination after completed treatment for malignant neoplasm: Secondary | ICD-10-CM | POA: Diagnosis not present

## 2023-12-11 DIAGNOSIS — L820 Inflamed seborrheic keratosis: Secondary | ICD-10-CM | POA: Diagnosis not present

## 2023-12-11 DIAGNOSIS — D485 Neoplasm of uncertain behavior of skin: Secondary | ICD-10-CM | POA: Diagnosis not present

## 2023-12-11 DIAGNOSIS — L814 Other melanin hyperpigmentation: Secondary | ICD-10-CM | POA: Diagnosis not present

## 2023-12-11 DIAGNOSIS — D225 Melanocytic nevi of trunk: Secondary | ICD-10-CM | POA: Diagnosis not present

## 2023-12-11 DIAGNOSIS — L2989 Other pruritus: Secondary | ICD-10-CM | POA: Diagnosis not present

## 2023-12-15 ENCOUNTER — Other Ambulatory Visit: Payer: Self-pay | Admitting: Endocrinology

## 2023-12-22 ENCOUNTER — Other Ambulatory Visit: Payer: Self-pay

## 2023-12-22 DIAGNOSIS — Z794 Long term (current) use of insulin: Secondary | ICD-10-CM

## 2023-12-22 MED ORDER — METFORMIN HCL ER 500 MG PO TB24: 1000.0000 mg | ORAL_TABLET | Freq: Two times a day (BID) | ORAL | 0 refills | Status: DC

## 2023-12-23 ENCOUNTER — Other Ambulatory Visit (HOSPITAL_COMMUNITY): Payer: Self-pay

## 2023-12-23 ENCOUNTER — Other Ambulatory Visit: Payer: Self-pay

## 2023-12-23 DIAGNOSIS — Z794 Long term (current) use of insulin: Secondary | ICD-10-CM

## 2023-12-24 NOTE — Progress Notes (Signed)
 Cardiology Office Note    Patient Name: Nathan Duncan Date of Encounter: 12/26/2023  Primary Care Provider:  Cindy Clotilda HERO, DO Primary Cardiologist:  Oneil Parchment, MD Primary Electrophysiologist: None   Past Medical History    Past Medical History:  Diagnosis Date   Diabetes [facility])    Dyslipidemia    History of Present Illness  Nathan Duncan is a 73 y.o. male with a PMH of CAD s/p LHC PCI/DES x 1 to P LAD proximal RI, HLD, HTN, OSA, DM type II, Cheyne-Stokes breathing, aortic atherosclerosis who presents today for post PCI follow-up.  Nathan Duncan was seen by Dr. Parchment on 10/17/2023 for evaluation of shortness of breath and precordial discomfort.  He was recently diagnosed with OSA and found to have Cheyne-Stokes breathing and underwent further evaluation with 2D echo and coronary CT.  The results of coronary CTA revealed calcium score 370 with subtotal occluded mid LAD involving the origin of the first diagonal.  2D echo was also completed showing EF of 60 to 65% with no RWMA and grade 1 DD.  He was seen back in follow-up on 11/14/2023 recommendation was made for outpatient LHC.  He underwent LHC on 11/25/2023 by Dr. Anner that revealed severe multivessel disease with 85% proximal RI and significant pLAD disease with 85% proximal RI and significant LAD disease involving D1 sidebranch before occlusion of the LAD after D1 filling via collaterals from RV marginal branch.  Following discussion with cardiology team decision was made to treat RI and LAD with PCI and DES to RI and LAD.  He was treated with DAPT (ASA 81 mg and Brilinta ) plan to stop aspirin  at 6 months with continuation of maintenance dose of Brilinta  60 mg twice daily for 1 year.  He was discharged following PCI in stable condition.  Nathan Duncan presents today for post heart catheterization follow-up.   He recently underwent a heart catheterization and had a stent placed due to an abnormal coronary CT. Since the  procedure, the patient reports increased fatigue and tiredness, which is a new symptom. The patient was previously active, walking three times a day, and plans to resume this routine. The patient is also considering cardiac rehab. The patient's medications include Jardiance , Lipitor, aspirin , Brilinta , and Lovaza  (omega-3). The patient has a history of smoking but quit 40 years ago. The patient also has a history of high blood pressure and is currently experiencing some shortness of breath. The patient has been experiencing some difficulty sleeping and has been taking Nyquil and melatonin to help with sleep. The patient also reports some bruising and bleeding due to blood thinner medication. Hedenies chest pain, palpitations, dyspnea, PND, orthopnea, nausea, vomiting, dizziness, syncope, edema, weight gain, or early satiety.   Review of Systems  Please see the history of present illness.    All other systems reviewed and are otherwise negative except as noted above.  Physical Exam    Wt Readings from Last 3 Encounters:  12/26/23 193 lb 9.6 oz (87.8 kg)  12/02/23 187 lb (84.8 kg)  11/25/23 185 lb (83.9 kg)   VS: Vitals:   12/26/23 0900  BP: (!) 118/58  Pulse: 62  SpO2: 98%  ,Body mass index is 27 kg/m. GEN: Well nourished, well developed in no acute distress Neck: No JVD; No carotid bruits Pulmonary: Clear to auscultation without rales, wheezing or rhonchi  Cardiovascular: Normal rate. Regular right radial site clean dry and intact with no signs of hematoma or ecchymosis present.  Rhythm.  Normal S1. Normal S2.   Murmurs: There is no murmur.  ABDOMEN: Soft, non-tender, non-distended EXTREMITIES:  No edema; No deformity   EKG/LABS/ Recent Cardiac Studies   ECG personally reviewed by me today -none completed today  Risk Assessment/Calculations:          Lab Results  Component Value Date   WBC 6.7 12/02/2023   HGB 13.8 12/02/2023   HCT 40.5 12/02/2023   MCV 86.0 12/02/2023   PLT  217 12/02/2023   Lab Results  Component Value Date   CREATININE 1.29 (H) 12/02/2023   BUN 36 (H) 12/02/2023   NA 135 12/02/2023   K 4.3 12/02/2023   CL 107 12/02/2023   CO2 19 (L) 12/02/2023   Lab Results  Component Value Date   CHOL 144 12/02/2023   HDL 31 (L) 12/02/2023   LDLCALC 65 12/02/2023   TRIG 238 (H) 12/02/2023   CHOLHDL 4.6 12/02/2023    Lab Results  Component Value Date   HGBA1C 7.9 (H) 12/25/2023   Assessment & Plan    1.  CAD: -s/p LHC revealing three-vessel disease and PCI/DES x 1 to P LAD proximal RI -Today patient reports no chest pain or shortness of breath since PCI procedure. -Continue current medications including Brilinta , Aspirin , Lipitor, and Jardiance . -Consider Cardiac Rehabilitation for exercise and lifestyle modification education. -Check cholesterol levels in 1 month. -Follow up in 3 months.   2.  Hypertriglyceridemia Elevated triglycerides. Insurance issues with prescribed Lovaza  (Omega-3). -Consider over-the-counter fish oil as an alternative. -Continue diet modifications to lower triglycerides.  3.  DM type II: -Continue current treatment plan per PCP with insulin  therapy -Continue Jardiance  25 mg  4.  Essential hypertension: -Patient's blood pressure today was controlled at 118/58 -Continue Zestril 5 mg daily  5.  History of OSA: -Encouraged to continue nightly CPAP management.  6.  Sleep disturbance:  -Currently using Nyquil and Melatonin nightly for sleep. -Discourage nightly use of Nyquil due to potential for hypertension and habit formation. -Consider other natural sleep aids and discuss with pharmacist for potential interaction  7. Family History of CAD: Patient is a former smoker with two sons. -Recommend sons get Lipoprotein A and Calcium Score tests for early detection of coronary disease.    Cardiac Rehabilitation Eligibility Assessment  The patient is ready to start cardiac rehabilitation from a cardiac  standpoint.    Disposition: Follow-up with Oneil Parchment, MD or APP in 3 months    Signed, Wyn Raddle, Jackee Shove, NP 12/26/2023, 10:49 AM [facility]

## 2023-12-25 ENCOUNTER — Other Ambulatory Visit: Payer: Medicare Other

## 2023-12-25 DIAGNOSIS — Z794 Long term (current) use of insulin: Secondary | ICD-10-CM | POA: Diagnosis not present

## 2023-12-25 DIAGNOSIS — E1169 Type 2 diabetes mellitus with other specified complication: Secondary | ICD-10-CM | POA: Diagnosis not present

## 2023-12-25 LAB — HEMOGLOBIN A1C
Hgb A1c MFr Bld: 7.9 %{Hb} — ABNORMAL HIGH (ref <5.7)
Mean Plasma Glucose: 180 mg/dL
eAG (mmol/L): 10 mmol/L

## 2023-12-26 ENCOUNTER — Ambulatory Visit: Payer: Medicare Other | Attending: Nurse Practitioner | Admitting: Nurse Practitioner

## 2023-12-26 ENCOUNTER — Telehealth: Payer: Self-pay

## 2023-12-26 ENCOUNTER — Encounter: Payer: Self-pay | Admitting: Nurse Practitioner

## 2023-12-26 ENCOUNTER — Other Ambulatory Visit (HOSPITAL_COMMUNITY): Payer: Self-pay

## 2023-12-26 ENCOUNTER — Telehealth: Payer: Self-pay | Admitting: Pharmacy Technician

## 2023-12-26 VITALS — BP 118/58 | HR 62 | Ht 71.0 in | Wt 193.6 lb

## 2023-12-26 DIAGNOSIS — E785 Hyperlipidemia, unspecified: Secondary | ICD-10-CM | POA: Insufficient documentation

## 2023-12-26 DIAGNOSIS — I10 Essential (primary) hypertension: Secondary | ICD-10-CM | POA: Insufficient documentation

## 2023-12-26 DIAGNOSIS — G4733 Obstructive sleep apnea (adult) (pediatric): Secondary | ICD-10-CM | POA: Insufficient documentation

## 2023-12-26 DIAGNOSIS — Z79899 Other long term (current) drug therapy: Secondary | ICD-10-CM | POA: Insufficient documentation

## 2023-12-26 DIAGNOSIS — Z955 Presence of coronary angioplasty implant and graft: Secondary | ICD-10-CM | POA: Insufficient documentation

## 2023-12-26 DIAGNOSIS — E119 Type 2 diabetes mellitus without complications: Secondary | ICD-10-CM | POA: Diagnosis not present

## 2023-12-26 DIAGNOSIS — I2510 Atherosclerotic heart disease of native coronary artery without angina pectoris: Secondary | ICD-10-CM | POA: Insufficient documentation

## 2023-12-26 MED ORDER — OMEGA-3-ACID ETHYL ESTERS 1 G PO CAPS: 1.0000 g | ORAL_CAPSULE | Freq: Two times a day (BID) | ORAL | 11 refills | Status: DC

## 2023-12-26 MED ORDER — TICAGRELOR 90 MG PO TABS: 90.0000 mg | ORAL_TABLET | Freq: Two times a day (BID) | ORAL | 3 refills | Status: DC

## 2023-12-26 NOTE — Patient Instructions (Signed)
 Medication Instructions:  We have submitted a prior authorization for Lovaza . Please call our office on Monday, 12/29/23 to follow up on the status of this authorization.  *If you need a refill on your cardiac medications before your next appointment, please call your pharmacy*   Lab Work: Please complete a FASTING lipid panel, an LP(a) and an ALT at any [facility] at your earliest convenience.  If you have labs (blood work) drawn today and your tests are completely normal, you will receive your results only by: MyChart Message (if you have MyChart) OR A paper copy in the mail If you have any lab test that is abnormal or we need to change your treatment, we will call you to review the results.   Testing/Procedures: None.   Follow-Up: At [facility], you and your health needs are our priority.  As part of our continuing mission to provide you with exceptional heart care, we have created designated Provider Care Teams.  These Care Teams include your primary Cardiologist (physician) and Advanced Practice Providers (APPs -  Physician Assistants and Nurse Practitioners) who all work together to provide you with the care you need, when you need it.  We recommend signing up for the patient portal called MyChart.  Sign up information is provided on this After Visit Summary.  MyChart is used to connect with patients for Virtual Visits (Telemedicine).  Patients are able to view lab/test results, encounter notes, upcoming appointments, etc.  Non-urgent messages can be sent to your provider as well.   To learn more about what you can do with MyChart, go to [email or web address].    Your next appointment:   3 month(s)  Provider:   Oneil Parchment, MD  or Jackee Alberts, NP   Other Instructions Please talk with your family members about completing an LP(a) test as well as a coronary calcium score CT. These are tests that can help identify cardiac risks.   Please talk to a pharmacist about  stopping Nyquil and melatonin for sleep and switching to Ashwaganda and Magnesium Glycinate instead.

## 2023-12-26 NOTE — Addendum Note (Signed)
 Addended by: Luellen Pucker on: 12/26/2023 10:25 AM   Modules accepted: Orders

## 2023-12-26 NOTE — Telephone Encounter (Signed)
 PA request has been Cancelled. New Encounter created for follow up. For additional info see Pharmacy Prior Auth telephone encounter from 1//3/25.

## 2023-12-26 NOTE — Telephone Encounter (Signed)
 Call to patient to advise that per Prior auth team, no prior auth is required when test claim is run through [facility] [employer] and copay is $5. Patient requests script be sent to [facility] on [address], advised that I cannot guarantee that price at [facility], pateitn states he wants to try one more time before he drives from [place] to a different pharmacy that is further away. Order placed, advised that if he doesn't get the same quoted price he may have to call our office back and have script sent to [facility]. Patient verbalizes understanding.

## 2023-12-26 NOTE — Telephone Encounter (Signed)
 Patient requesting prior auth for Lovaza, he states it will cost him less if he gets a prior [place].

## 2023-12-26 NOTE — Telephone Encounter (Signed)
 Pharmacy Patient Advocate Encounter   Received notification from Pt Calls Messages that prior authorization for Omega-3 1g capsules is required/requested.   Insurance verification completed.   The patient is insured through [employer] .   Per test claim: The current 30 day co-pay is, $5.00.  No PA needed at this time. This test claim was processed through [facility]- copay amounts may vary at other pharmacies due to pharmacy/plan contracts, or as the patient moves through the different stages of their insurance plan.

## 2024-01-01 ENCOUNTER — Ambulatory Visit: Payer: Medicare Other | Admitting: Endocrinology

## 2024-01-01 ENCOUNTER — Telehealth (HOSPITAL_COMMUNITY): Payer: Self-pay

## 2024-01-01 NOTE — Telephone Encounter (Signed)
 Called and spoke with pt in regards to CR, pt stated he was dealing with the death of his dog and would give CR a call back next week.   Placed pt ppw back in ready to schedule bin.

## 2024-01-05 ENCOUNTER — Encounter: Payer: Self-pay | Admitting: Endocrinology

## 2024-01-05 ENCOUNTER — Telehealth (HOSPITAL_COMMUNITY): Payer: Self-pay

## 2024-01-05 ENCOUNTER — Ambulatory Visit (INDEPENDENT_AMBULATORY_CARE_PROVIDER_SITE_OTHER): Payer: Medicare Other | Admitting: Endocrinology

## 2024-01-05 DIAGNOSIS — Z794 Long term (current) use of insulin: Secondary | ICD-10-CM

## 2024-01-05 DIAGNOSIS — E1169 Type 2 diabetes mellitus with other specified complication: Secondary | ICD-10-CM

## 2024-01-05 MED ORDER — NOVOLOG FLEXPEN 100 UNIT/ML ~~LOC~~ SOPN: PEN_INJECTOR | SUBCUTANEOUS | 4 refills | Status: DC

## 2024-01-05 NOTE — Progress Notes (Signed)
 Outpatient Endocrinology Note Nolan Lasser, MD  01/05/24  Patient's Name: Nathan Duncan    DOB: 09/26/1951    MRN: [id number]                                                    REASON OF VISIT: Follow up for type 2 diabetes mellitus  PCP: Cindy Clotilda HERO, DO  HISTORY OF PRESENT ILLNESS:   Nathan Duncan is a 73 y.o. old male with past medical history listed below, is here for follow up of type 2 diabetes mellitus.   Pertinent Diabetes History: Patient was diagnosed with type 2 diabetes mellitus in 1985.  Chronic Diabetes Complications : Retinopathy: no. Last ophthalmology exam was done on annually, reportedly. Nephropathy: no, on lisinopril. Peripheral neuropathy: yes numbness and tingling of the feet.  On gabapentin . Coronary artery disease: yes, status post stent. Stroke: no  Relevant comorbidities and cardiovascular risk factors: Obesity: no Body mass index is 26.36 kg/m.  Hypertension: yes Hyperlipidemia. Yes, on statin.   Current / Home Diabetic regimen includes: Tresiba  5-6 units at supper time.  Novolog  4-6 units with breakfast and lunch, not with supper. Jardiance  25 mg daily. Metformin  XR 500 mg 2 tabs two times a day with meals.  Glimepiride  2 mg daily.  Prior diabetic medications: Januvia , Rybelsus , Actos , Invokana  in the past.  Glycemic data:    CONTINUOUS GLUCOSE MONITORING SYSTEM (CGMS) INTERPRETATION: At today's visit, we reviewed CGM downloads. The full report is scanned in the media. Reviewing the CGM trends, blood glucose are as follows:  FreeStyle Libre 3 CGM-  Sensor Download (Sensor download was reviewed and summarized below.) Dates: December 31 to January 05, 2024 for 14 days Glucose Management Indicator: 7%  % data captured: 98%    Interpretation: Mostly acceptable blood sugar.  Occasional hyperglycemia with blood sugar up to 300 requiring additional dose of NovoLog .  No significant hypoglycemia.  Blood sugar overnight and in  between the meals are acceptable.  Hypoglycemia: Patient has minor hypoglycemic episodes. Patient has hypoglycemia awareness.  Factors modifying glucose control: 1.  Diabetic diet assessment: 3 meals a day.  2.  Staying active or exercising: No formal exercise.  Some yard work.  3.  Medication compliance: compliant all of the time.  Interval history   CGM data as reviewed above.  Mostly acceptable blood sugar.  GMI on CGM 7%.  Recent hemoglobin A1c 7.9%.  He reports his diet was not great during the holiday time.  Since last visit he was diagnosed with coronary artery disease requiring stent.  He has complaints of numbness and tingling of the feet.  No other complaints today.  REVIEW OF SYSTEMS As per history of present illness.   PAST MEDICAL HISTORY: Past Medical History:  Diagnosis Date   Diabetes (HCC)    Dyslipidemia     PAST SURGICAL HISTORY: Past Surgical History:  Procedure Laterality Date   CORONARY STENT INTERVENTION N/A 12/02/2023   Procedure: CORONARY STENT INTERVENTION;  Surgeon: Anner Alm ORN, MD;  Location: [facility] INVASIVE CV LAB;  Service: Cardiovascular;  Laterality: N/A;   LEFT HEART CATH AND CORONARY ANGIOGRAPHY N/A 11/25/2023   Procedure: LEFT HEART CATH AND CORONARY ANGIOGRAPHY;  Surgeon: Anner Alm ORN, MD;  Location: [facility] INVASIVE CV LAB;  Service: Cardiovascular;  Laterality: N/A;    ALLERGIES: Allergies  Allergen Reactions  Seasonal Ic [Octacosanol] Other (See Comments)    Congestion   Semaglutide      REFLUX     FAMILY HISTORY:  Family History  Problem Relation Age of Onset   Diabetes Mother     SOCIAL HISTORY: Social History   Socioeconomic History   Marital status: Married    Spouse name: Not on file   Number of children: Not on file   Years of education: Not on file   Highest education level: Not on file  Occupational History   Not on file  Tobacco Use   Smoking status: Former   Smokeless tobacco: Never  Substance and Sexual  Activity   Alcohol use: Yes    Comment: occ or 1 per month   Drug use: Never   Sexual activity: Not on file  Other Topics Concern   Not on file  Social History Narrative   Not on file   Social Drivers of Health   Financial Resource Strain: Not on file  Food Insecurity: Not on file  Transportation Needs: Not on file  Physical Activity: Not on file  Stress: Not on file  Social Connections: Not on file    MEDICATIONS:  Current Outpatient Medications  Medication Sig Dispense Refill   aspirin  EC 81 MG tablet Take 81 mg by mouth at bedtime.     atorvastatin (LIPITOR) 40 MG tablet Take 40 mg by mouth daily.      fluticasone (FLONASE) 50 MCG/ACT nasal spray Place 1 spray into both nostrils daily.     gabapentin  (NEURONTIN ) 100 MG capsule Take 1 capsule (100 mg total) by mouth at bedtime. 90 capsule 3   glimepiride  (AMARYL ) 2 MG tablet Take 1 tablet (2 mg total) by mouth daily with breakfast. 90 tablet 4   insulin  degludec (TRESIBA  FLEXTOUCH) 100 UNIT/ML FlexTouch Pen Inject once daily, adjust as directed with max dose 25 units 6 mL 0   Insulin  Pen Needle (B-D UF III MINI PEN NEEDLES) 31G X 5 MM MISC USE WITH INSULIN  PEN/uses 3 to 4 a day 100 each 5   JARDIANCE  25 MG TABS tablet Take 1 tablet by mouth once daily 90 tablet 0   lisinopril (PRINIVIL,ZESTRIL) 5 MG tablet Take 5 mg by mouth daily.     metFORMIN  (GLUCOPHAGE -XR) 500 MG 24 hr tablet Take 2 tablets (1,000 mg total) by mouth 2 (two) times daily with a meal. 360 tablet 0   nitroGLYCERIN  (NITROSTAT ) 0.4 MG SL tablet Place 1 tablet (0.4 mg total) under the tongue every 5 (five) minutes as needed. 25 tablet 2   omega-3 acid ethyl esters (LOVAZA ) 1 g capsule Take 1 capsule (1 g total) by mouth 2 (two) times daily. 60 capsule 11   ticagrelor  (BRILINTA ) 90 MG TABS tablet Take 1 tablet (90 mg total) by mouth 2 (two) times daily. 180 tablet 3   insulin  aspart (NOVOLOG  FLEXPEN) 100 UNIT/ML FlexPen Inject 4-5 units into skin 3 times daily  before meals. 15 mL 4   No current facility-administered medications for this visit.    PHYSICAL EXAM: Vitals:   01/05/24 1419  BP: (!) 140/70  Pulse: 66  SpO2: 99%  Weight: 189 lb (85.7 kg)  Height: 5' 11 (1.803 m)    Body mass index is 26.36 kg/m.  Wt Readings from Last 3 Encounters:  01/05/24 189 lb (85.7 kg)  12/26/23 193 lb 9.6 oz (87.8 kg)  12/02/23 187 lb (84.8 kg)    General: Well developed, well nourished male in no apparent  distress.  HEENT: AT/[place], no external lesions.  Eyes: Conjunctiva clear and no icterus. Neck: Neck supple  Lungs: Respirations not labored Neurologic: Alert, oriented, normal speech Extremities / Skin: Dry.  Psychiatric: Does not appear depressed or anxious  Diabetic Foot Exam - Simple   Simple Foot Form Diabetic Foot exam was performed with the following findings: Yes 01/05/2024  2:36 PM  Visual Inspection No deformities, no ulcerations, no other skin breakdown bilaterally: Yes Sensation Testing Intact to touch and monofilament testing bilaterally: Yes Pulse Check Comments DP palpable bilaterally.     LABS Reviewed Lab Results  Component Value Date   HGBA1C 7.9 (H) 12/25/2023   HGBA1C 7.5 (H) 09/29/2023   HGBA1C 7.9 (H) 07/15/2023   Lab Results  Component Value Date   FRUCTOSAMINE 311 (H) 07/15/2023   FRUCTOSAMINE 350 (H) 03/04/2023   Lab Results  Component Value Date   CHOL 144 12/02/2023   HDL 31 (L) 12/02/2023   LDLCALC 65 12/02/2023   TRIG 238 (H) 12/02/2023   CHOLHDL 4.6 12/02/2023   Lab Results  Component Value Date   MICRALBCREAT 2.2 07/15/2023   MICRALBCREAT 4.4 06/27/2022   Lab Results  Component Value Date   CREATININE 1.29 (H) 12/02/2023   Lab Results  Component Value Date   GFR 69.04 09/29/2023    ASSESSMENT / PLAN  1. Type 2 diabetes mellitus with other specified complication, with long-term current use of insulin  (HCC)      Diabetes Mellitus type 2, complicated by diabetic neuropathy and  CAD - Diabetic status / severity: Uncontrolled, improving.  Lab Results  Component Value Date   HGBA1C 7.9 (H) 12/25/2023    - Hemoglobin A1c goal : <7%  GMI on CGM 7.0% mostly acceptable.  - Medications: See below. Diabetes regimen: No change. Tresiba  /  Degludec 5 units daily in the morning / fixed dose.  Novlog 4-6 units with meals three times a day, adjust based on meals size. Continue jardiance  25mg  daily. Metfromin XR 500 mg 2 tablets two times a day.  Continue glimepiride  2 mg daily.   - Home glucose testing: CGM and check as needed. - Discussed/ Gave Hypoglycemia treatment plan.  # Consult : not required at this time.   # Annual urine for microalbuminuria/ creatinine ratio, no microalbuminuria currently, continue ACE/ARB /lisinopril. Last  Lab Results  Component Value Date   MICRALBCREAT 2.2 07/15/2023    # Foot check nightly / neuropathy, continue gabapentin .  # Annual dilated diabetic eye exams.   - Diet: Make healthy diabetic food choices - Life style / activity / exercise: Discussed.  2. Blood pressure  -  BP Readings from Last 1 Encounters:  01/05/24 (!) 140/70    - Control is in target.  - No change in current plans.  3. Lipid status / Hyperlipidemia - Last  Lab Results  Component Value Date   LDLCALC 65 12/02/2023   - Continue atorvastatin 40 mg daily.  Managed by primary care provider.  Mikell was seen today for follow-up.  Diagnoses and all orders for this visit:  Type 2 diabetes mellitus with other specified complication, with long-term current use of insulin  (HCC) -     insulin  aspart (NOVOLOG  FLEXPEN) 100 UNIT/ML FlexPen; Inject 4-5 units into skin 3 times daily before meals. -     BASIC METABOLIC PANEL WITH GFR -     Hemoglobin A1c     DISPOSITION Follow up in clinic in 3  months suggested.   All questions answered and  patient verbalized understanding of the plan.  Lainie Daubert, MD [facility] Endocrinology [facility] [address] [place], [address] [place], [place] [address] Phone # [phone]  At least part of this note was generated using voice recognition software. Inadvertent word errors may have occurred, which were not recognized during the proofreading process.

## 2024-01-05 NOTE — Telephone Encounter (Signed)
 Called and spoke with pt in regards to CR, pt stated he is not interested at this time.   Closed referral

## 2024-01-07 ENCOUNTER — Encounter: Payer: Self-pay | Admitting: Endocrinology

## 2024-01-20 ENCOUNTER — Telehealth: Payer: Self-pay

## 2024-01-20 ENCOUNTER — Other Ambulatory Visit (HOSPITAL_COMMUNITY): Payer: Self-pay

## 2024-01-20 NOTE — Telephone Encounter (Signed)
Pharmacy Patient Advocate Encounter   Received notification from Fax that prior authorization for LOVAZA is required/requested.   Insurance verification completed.   The patient is insured through [employer] .   Per test claim:  ICOSAPENT ETHYL 1 GRAM is preferred by the insurance.  If suggested medication is appropriate, Please send in a new RX and discontinue this one. If not, please advise as to why it's not appropriate so that we may request a Prior Authorization. Please note, some preferred medications may still require a PA.  If the suggested medications have not been trialed and there are no contraindications to their use, the PA will not be submitted, as it will not be approved. $5 COPAY ON GENERIC VASCEPA WITH NO PA REQUIRED

## 2024-01-23 MED ORDER — ICOSAPENT ETHYL 1 G PO CAPS: 2.0000 g | ORAL_CAPSULE | Freq: Two times a day (BID) | ORAL | 11 refills | Status: AC

## 2024-01-23 NOTE — Addendum Note (Signed)
Addended by: Malena Peer D on: 01/23/2024 04:21 PM   Modules accepted: Orders

## 2024-01-26 ENCOUNTER — Telehealth (HOSPITAL_COMMUNITY): Payer: Self-pay

## 2024-01-26 NOTE — Telephone Encounter (Signed)
Pt insurance is active and benefits verified through Medicare A/B. Co-pay $0.00, DED $257.00/$257.00 met, out of pocket $0.00/$0.00 met, co-insurance 20%. No pre-authorization required. Passport, 01/26/24 @ 11:45AM, REF#[id number]   How many CR sessions are covered? (36 visits for TCR, 72 visits for ICR)72 Is this a lifetime maximum or an annual maximum? Lifetime Has the member used any of these services to date? No Is there a time limit (weeks/months) on start of program and/or program completion? No   2ndary insurance is active and benefits verified through [employer]. Co-pay $0.00, DED $350.00/$0.00 met, out of pocket $6,000.00/$181.75 met, co-insurance 35%. No pre-authorization required. Passport, 01/26/24 @ 11:53AM, REF#[id number]

## 2024-01-26 NOTE — Telephone Encounter (Signed)
Pt returned CR and stated he is interested.  Patient will come in for orientation on 01/28/24 @ 130PM and will attend the 1015AM exercise class.   Pensions consultant.

## 2024-01-28 ENCOUNTER — Other Ambulatory Visit: Payer: Self-pay | Admitting: Endocrinology

## 2024-01-28 ENCOUNTER — Encounter (HOSPITAL_COMMUNITY)
Admission: RE | Admit: 2024-01-28 | Discharge: 2024-01-28 | Disposition: A | Discharge status: Home / Self Care | Payer: Medicare Other | Source: Ambulatory Visit | Attending: Cardiology | Admitting: Cardiology

## 2024-01-28 VITALS — Ht 71.0 in | Wt 194.0 lb

## 2024-01-28 DIAGNOSIS — Z955 Presence of coronary angioplasty implant and graft: Secondary | ICD-10-CM | POA: Diagnosis not present

## 2024-01-28 DIAGNOSIS — Z48812 Encounter for surgical aftercare following surgery on the circulatory system: Secondary | ICD-10-CM | POA: Diagnosis not present

## 2024-01-28 NOTE — Progress Notes (Signed)
 Cardiac Individual Treatment Plan  Patient Details  Name: Nathan Duncan MRN: [id number] Date of Birth: 1951-08-15 Referring Provider:   Flowsheet Row INTENSIVE CARDIAC REHAB ORIENT from 01/28/2024 in [facility]  Referring Provider Jeffrie Anes       Initial Encounter Date:  Flowsheet Row INTENSIVE CARDIAC REHAB ORIENT from 01/28/2024 in [facility]  Date 01/28/24       Visit Diagnosis: 12/02/23 S/P coronary artery stent placement  Patient's Home Medications on Admission:  Current Outpatient Medications:    aspirin  EC 81 MG tablet, Take 81 mg by mouth at bedtime., Disp: , Rfl:    atorvastatin (LIPITOR) 40 MG tablet, Take 40 mg by mouth daily. , Disp: , Rfl:    fluticasone (FLONASE) 50 MCG/ACT nasal spray, Place 1 spray into both nostrils daily., Disp: , Rfl:    gabapentin  (NEURONTIN ) 100 MG capsule, Take 1 capsule (100 mg total) by mouth at bedtime., Disp: 90 capsule, Rfl: 3   glimepiride  (AMARYL ) 2 MG tablet, Take 1 tablet (2 mg total) by mouth daily with breakfast., Disp: 90 tablet, Rfl: 4   icosapent  Ethyl (VASCEPA ) 1 g capsule, Take 2 capsules (2 g total) by mouth 2 (two) times daily., Disp: 120 capsule, Rfl: 11   insulin  aspart (NOVOLOG  FLEXPEN) 100 UNIT/ML FlexPen, Inject 4-5 units into skin 3 times daily before meals., Disp: 15 mL, Rfl: 4   insulin  degludec (TRESIBA  FLEXTOUCH) 100 UNIT/ML FlexTouch Pen, Inject once daily, adjust as directed with max dose 25 units, Disp: 6 mL, Rfl: 0   Insulin  Pen Needle (B-D UF III MINI PEN NEEDLES) 31G X 5 MM MISC, USE WITH INSULIN  PEN/uses 3 to 4 a day, Disp: 100 each, Rfl: 5   JARDIANCE  25 MG TABS tablet, Take 1 tablet by mouth once daily, Disp: 90 tablet, Rfl: 0   lisinopril (PRINIVIL,ZESTRIL) 5 MG tablet, Take 5 mg by mouth daily., Disp: , Rfl:    metFORMIN  (GLUCOPHAGE -XR) 500 MG 24 hr tablet, Take 2 tablets (1,000 mg  total) by mouth 2 (two) times daily with a meal., Disp: 360 tablet, Rfl: 0   nitroGLYCERIN  (NITROSTAT ) 0.4 MG SL tablet, Place 1 tablet (0.4 mg total) under the tongue every 5 (five) minutes as needed., Disp: 25 tablet, Rfl: 2   ticagrelor  (BRILINTA ) 90 MG TABS tablet, Take 1 tablet (90 mg total) by mouth 2 (two) times daily., Disp: 180 tablet, Rfl: 3  Past Medical History: Past Medical History:  Diagnosis Date   Diabetes (HCC)    Dyslipidemia     Tobacco Use: Social History   Tobacco Use  Smoking Status Former  Smokeless Tobacco Never    Labs: Review Flowsheet  More data exists      Latest Ref Rng & Units 05/07/2023 07/15/2023 09/29/2023 12/02/2023 12/25/2023  Labs for ITP Cardiac and Pulmonary Rehab  Cholestrol 0 - 200 mg/dL - - - 855  -  LDL (calc) 0 - 99 mg/dL - - - 65  -  HDL-C >59 mg/dL - - - 31  -  Trlycerides <150 mg/dL - - - 761  -  Hemoglobin A1c <5.7 % of total Hgb 8.0  7.9  7.5  - 7.9     Capillary Blood Glucose: Lab Results  Component Value Date   GLUCAP 124 (H) 12/02/2023   GLUCAP 140 (H) 12/02/2023   GLUCAP 136 (H) 11/25/2023   GLUCAP 150 (H) 11/25/2023  Exercise Target Goals: Exercise Program Goal: Individual exercise prescription set using results from initial 6 min walk test and THRR while considering  patient's activity barriers and safety.   Exercise Prescription Goal: Initial exercise prescription builds to 30-45 minutes a day of aerobic activity, 2-3 days per week.  Home exercise guidelines will be given to patient during program as part of exercise prescription that the participant will acknowledge.  Activity Barriers & Risk Stratification:  Activity Barriers & Cardiac Risk Stratification - 01/28/24 1553       Activity Barriers & Cardiac Risk Stratification   Activity Barriers None    Cardiac Risk Stratification High             6 Minute Walk:  6 Minute Walk     Row Name 01/28/24 1543         6 Minute Walk   Phase Initial      Distance 1080 feet     Walk Time 6 minutes     # of Rest Breaks 0     MPH 2.04     METS 2.4     RPE 11     Perceived Dyspnea  0     VO2 Peak 8.42     Symptoms No     Resting HR 65 bpm     Resting BP 138/54     Resting Oxygen Saturation  96 %     Max Ex. HR 102 bpm     Max Ex. BP 154/50     2 Minute Post BP 130/50              Oxygen Initial Assessment:   Oxygen Re-Evaluation:   Oxygen Discharge (Final Oxygen Re-Evaluation):   Initial Exercise Prescription:  Initial Exercise Prescription - 01/28/24 1500       Date of Initial Exercise RX and Referring Provider   Date 01/28/24    Referring Provider Jeffrie Anes    Expected Discharge Date 05/21/24      Treadmill   MPH 2    Grade 0    Minutes 15    METs 2.5      Bike   Level 1.5    Watts 15    Minutes 15    METs 2.6      Prescription Details   Frequency (times per week) 3days/week    Duration Progress to 30 minutes of continuous aerobic without signs/symptoms of physical distress      Intensity   THRR 40-80% of Max Heartrate 58-117    Ratings of Perceived Exertion 11-13    Perceived Dyspnea 0-4      Progression   Progression Continue progressive overload as per policy without signs/symptoms or physical distress.      Resistance Training   Training Prescription Yes    Weight 4lbs    Reps 10-15             Perform Capillary Blood Glucose checks as needed.  Exercise Prescription Changes:   Exercise Comments:   Exercise Comments     Row Name 01/28/24 1547           Exercise Comments Orientation, Pt performed w/o unusual s/s                Exercise Goals and Review:   Exercise Goals     Row Name 01/28/24 1548             Exercise Goals   Increase Physical Activity Yes  Intervention Provide advice, education, support and counseling about physical activity/exercise needs.;Develop an individualized exercise prescription for aerobic and resistive training  based on initial evaluation findings, risk stratification, comorbidities and participant's personal goals.       Expected Outcomes Short Term: Attend rehab on a regular basis to increase amount of physical activity.;Long Term: Exercising regularly at least 3-5 days a week.;Long Term: Add in home exercise to make exercise part of routine and to increase amount of physical activity.       Increase Strength and Stamina Yes       Intervention Provide advice, education, support and counseling about physical activity/exercise needs.;Develop an individualized exercise prescription for aerobic and resistive training based on initial evaluation findings, risk stratification, comorbidities and participant's personal goals.       Expected Outcomes Short Term: Increase workloads from initial exercise prescription for resistance, speed, and METs.;Long Term: Improve cardiorespiratory fitness, muscular endurance and strength as measured by increased METs and functional capacity ( );Short Term: Perform resistance training exercises routinely during rehab and add in resistance training at home       Able to understand and use rate of perceived exertion (RPE) scale Yes       Intervention Provide education and explanation on how to use RPE scale       Expected Outcomes Short Term: Able to use RPE daily in rehab to express subjective intensity level;Long Term:  Able to use RPE to guide intensity level when exercising independently       Knowledge and understanding of Target Heart Rate Range (THRR) Yes       Intervention Provide education and explanation of THRR including how the numbers were predicted and where they are located for reference       Expected Outcomes Short Term: Able to state/look up THRR;Long Term: Able to use THRR to govern intensity when exercising independently;Short Term: Able to use daily as guideline for intensity in rehab       Understanding of Exercise Prescription Yes       Intervention Provide  education, explanation, and written materials on patient's individual exercise prescription       Expected Outcomes Short Term: Able to explain program exercise prescription;Long Term: Able to explain home exercise prescription to exercise independently                Exercise Goals Re-Evaluation :   Discharge Exercise Prescription (Final Exercise Prescription Changes):   Nutrition:  Target Goals: Understanding of nutrition guidelines, daily intake of sodium 1500mg , cholesterol 200mg , calories 30% from fat and 7% or less from saturated fats, daily to have 5 or more servings of fruits and vegetables.  Biometrics:  Pre Biometrics - 01/28/24 1547       Pre Biometrics   Height 5' 11 (1.803 m)    Weight 88 kg    Waist Circumference 44 inches    Hip Circumference 42 inches    Waist to Hip Ratio 1.05 %    BMI (Calculated) 27.07    Triceps Skinfold 14 mm    % Body Fat 28.9 %    Grip Strength 42 kg    Flexibility 14 in    Single Leg Stand 7.97 seconds              Nutrition Therapy Plan and Nutrition Goals:   Nutrition Assessments:  MEDIFICTS Score Key: >=70 Need to make dietary changes  40-70 Heart Healthy Diet <= 40 Therapeutic Level Cholesterol Diet    Picture Your Plate  Scores: <40 Unhealthy dietary pattern with much room for improvement. 41-50 Dietary pattern unlikely to meet recommendations for good health and room for improvement. 51-60 More healthful dietary pattern, with some room for improvement.  >60 Healthy dietary pattern, although there may be some specific behaviors that could be improved.    Nutrition Goals Re-Evaluation:   Nutrition Goals Re-Evaluation:   Nutrition Goals Discharge (Final Nutrition Goals Re-Evaluation):   Psychosocial: Target Goals: Acknowledge presence or absence of significant depression and/or stress, maximize coping skills, provide positive support system. Participant is able to verbalize types and ability to use  techniques and skills needed for reducing stress and depression.  Initial Review & Psychosocial Screening:   Quality of Life Scores:  Quality of Life - 01/28/24 1542       Quality of Life   Select Quality of Life      Quality of Life Scores   Health/Function Pre 24.3 %    Socioeconomic Pre 25.25 %    Psych/Spiritual Pre 23.71 %    Family Pre 30 %    GLOBAL Pre 25.21 %            Scores of 19 and below usually indicate a poorer quality of life in these areas.  A difference of  2-3 points is a clinically meaningful difference.  A difference of 2-3 points in the total score of the Quality of Life Index has been associated with significant improvement in overall quality of life, self-image, physical symptoms, and general health in studies assessing change in quality of life.  PHQ-9: Review Flowsheet       01/28/2024 05/13/2022 11/07/2016  Depression screen PHQ 2/9  Decreased Interest 3 0 0 0  Down, Depressed, Hopeless 1 0 0 1  PHQ - 2 Score 4 0 0 1  Altered sleeping 1 - -  Tired, decreased energy 1 - -  Change in appetite 0 - -  Feeling bad or failure about yourself  0 - -  Trouble concentrating 0 - -  Moving slowly or fidgety/restless 1 - -  Suicidal thoughts 0 - -  PHQ-9 Score 7 - -  Difficult doing work/chores Not difficult at all - -    Details       Multiple values from one day are sorted in reverse-chronological order        Interpretation of Total Score  Total Score Depression Severity:  1-4 = Minimal depression, 5-9 = Mild depression, 10-14 = Moderate depression, 15-19 = Moderately severe depression, 20-27 = Severe depression   Psychosocial Evaluation and Intervention:   Psychosocial Re-Evaluation:   Psychosocial Discharge (Final Psychosocial Re-Evaluation):   Vocational Rehabilitation: Provide vocational rehab assistance to qualifying candidates.   Vocational Rehab Evaluation & Intervention:  Vocational Rehab - 01/28/24 1542       Initial  Vocational Rehab Evaluation & Intervention   Assessment shows need for Vocational Rehabilitation No             Education: Education Goals: Education classes will be provided on a weekly basis, covering required topics. Participant will state understanding/return demonstration of topics presented.     Core Videos: Exercise    Move It!  Clinical staff conducted group or individual video education with verbal and written material and guidebook.  Patient learns the recommended Pritikin exercise program. Exercise with the goal of living a long, healthy life. Some of the health benefits of exercise include controlled diabetes, healthier blood pressure levels, improved cholesterol levels, improved heart and lung capacity,  improved sleep, and better body composition. Everyone should speak with their doctor before starting or changing an exercise routine.  Biomechanical Limitations Clinical staff conducted group or individual video education with verbal and written material and guidebook.  Patient learns how biomechanical limitations can impact exercise and how we can mitigate and possibly overcome limitations to have an impactful and balanced exercise routine.  Body Composition Clinical staff conducted group or individual video education with verbal and written material and guidebook.  Patient learns that body composition (ratio of muscle mass to fat mass) is a key component to assessing overall fitness, rather than body weight alone. Increased fat mass, especially visceral belly fat, can put us  at increased risk for metabolic syndrome, type 2 diabetes, heart disease, and even death. It is recommended to combine diet and exercise (cardiovascular and resistance training) to improve your body composition. Seek guidance from your physician and exercise physiologist before implementing an exercise routine.  Exercise Action Plan Clinical staff conducted group or individual video education with  verbal and written material and guidebook.  Patient learns the recommended strategies to achieve and enjoy long-term exercise adherence, including variety, self-motivation, self-efficacy, and positive decision making. Benefits of exercise include fitness, good health, weight management, more energy, better sleep, less stress, and overall well-being.  Medical   Heart Disease Risk Reduction Clinical staff conducted group or individual video education with verbal and written material and guidebook.  Patient learns our heart is our most vital organ as it circulates oxygen, nutrients, white blood cells, and hormones throughout the entire body, and carries waste away. Data supports a plant-based eating plan like the Pritikin Program for its effectiveness in slowing progression of and reversing heart disease. The video provides a number of recommendations to address heart disease.   Metabolic Syndrome and Belly Fat  Clinical staff conducted group or individual video education with verbal and written material and guidebook.  Patient learns what metabolic syndrome is, how it leads to heart disease, and how one can reverse it and keep it from coming back. You have metabolic syndrome if you have 3 of the following 5 criteria: abdominal obesity, high blood pressure, high triglycerides, low HDL cholesterol, and high blood sugar.  Hypertension and Heart Disease Clinical staff conducted group or individual video education with verbal and written material and guidebook.  Patient learns that high blood pressure, or hypertension, is very common in the [place] . Hypertension is largely due to excessive salt intake, but other important risk factors include being overweight, physical inactivity, drinking too much alcohol, smoking, and not eating enough potassium from fruits and vegetables. High blood pressure is a leading risk factor for heart attack, stroke, congestive heart failure, dementia, kidney failure, and  premature death. Long-term effects of excessive salt intake include stiffening of the arteries and thickening of heart muscle and organ damage. Recommendations include ways to reduce hypertension and the risk of heart disease.  Diseases of Our Time - Focusing on Diabetes Clinical staff conducted group or individual video education with verbal and written material and guidebook.  Patient learns why the best way to stop diseases of our time is prevention, through food and other lifestyle changes. Medicine (such as prescription pills and surgeries) is often only a Band-Aid on the problem, not a long-term solution. Most common diseases of our time include obesity, type 2 diabetes, hypertension, heart disease, and cancer. The Pritikin Program is recommended and has been proven to help reduce, reverse, and/or prevent the damaging effects of metabolic syndrome.  Nutrition   Overview of the Pritikin Eating Plan  Clinical staff conducted group or individual video education with verbal and written material and guidebook.  Patient learns about the Pritikin Eating Plan for disease risk reduction. The Pritikin Eating Plan emphasizes a wide variety of unrefined, minimally-processed carbohydrates, like fruits, vegetables, whole grains, and legumes. Go, Caution, and Stop food choices are explained. Plant-based and lean animal proteins are emphasized. Rationale provided for low sodium intake for blood pressure control, low added sugars for blood sugar stabilization, and low added fats and oils for coronary artery disease risk reduction and weight management.  Calorie Density  Clinical staff conducted group or individual video education with verbal and written material and guidebook.  Patient learns about calorie density and how it impacts the Pritikin Eating Plan. Knowing the characteristics of the food you choose will help you decide whether those foods will lead to weight gain or weight loss, and whether you want to  consume more or less of them. Weight loss is usually a side effect of the Pritikin Eating Plan because of its focus on low calorie-dense foods.  Label Reading  Clinical staff conducted group or individual video education with verbal and written material and guidebook.  Patient learns about the Pritikin recommended label reading guidelines and corresponding recommendations regarding calorie density, added sugars, sodium content, and whole grains.  Dining Out - Part 1  Clinical staff conducted group or individual video education with verbal and written material and guidebook.  Patient learns that restaurant meals can be sabotaging because they can be so high in calories, fat, sodium, and/or sugar. Patient learns recommended strategies on how to positively address this and avoid unhealthy pitfalls.  Facts on Fats  Clinical staff conducted group or individual video education with verbal and written material and guidebook.  Patient learns that lifestyle modifications can be just as effective, if not more so, as many medications for lowering your risk of heart disease. A Pritikin lifestyle can help to reduce your risk of inflammation and atherosclerosis (cholesterol build-up, or plaque, in the artery walls). Lifestyle interventions such as dietary choices and physical activity address the cause of atherosclerosis. A review of the types of fats and their impact on blood cholesterol levels, along with dietary recommendations to reduce fat intake is also included.  Nutrition Action Plan  Clinical staff conducted group or individual video education with verbal and written material and guidebook.  Patient learns how to incorporate Pritikin recommendations into their lifestyle. Recommendations include planning and keeping personal health goals in mind as an important part of their success.  Healthy Mind-Set    Healthy Minds, Bodies, Hearts  Clinical staff conducted group or individual video education with  verbal and written material and guidebook.  Patient learns how to identify when they are stressed. Video will discuss the impact of that stress, as well as the many benefits of stress management. Patient will also be introduced to stress management techniques. The way we think, act, and feel has an impact on our hearts.  How Our Thoughts Can Heal Our Hearts  Clinical staff conducted group or individual video education with verbal and written material and guidebook.  Patient learns that negative thoughts can cause depression and anxiety. This can result in negative lifestyle behavior and serious health problems. Cognitive behavioral therapy is an effective method to help control our thoughts in order to change and improve our emotional outlook.  Additional Videos:  Exercise    Improving Performance  Clinical staff conducted  group or individual video education with verbal and written material and guidebook.  Patient learns to use a non-linear approach by alternating intensity levels and lengths of time spent exercising to help burn more calories and lose more body fat. Cardiovascular exercise helps improve heart health, metabolism, hormonal balance, blood sugar control, and recovery from fatigue. Resistance training improves strength, endurance, balance, coordination, reaction time, metabolism, and muscle mass. Flexibility exercise improves circulation, posture, and balance. Seek guidance from your physician and exercise physiologist before implementing an exercise routine and learn your capabilities and proper form for all exercise.  Introduction to Yoga  Clinical staff conducted group or individual video education with verbal and written material and guidebook.  Patient learns about yoga, a discipline of the coming together of mind, breath, and body. The benefits of yoga include improved flexibility, improved range of motion, better posture and core strength, increased lung function, weight loss, and  positive self-image. Yoga's heart health benefits include lowered blood pressure, healthier heart rate, decreased cholesterol and triglyceride levels, improved immune function, and reduced stress. Seek guidance from your physician and exercise physiologist before implementing an exercise routine and learn your capabilities and proper form for all exercise.  Medical   Aging: Enhancing Your Quality of Life  Clinical staff conducted group or individual video education with verbal and written material and guidebook.  Patient learns key strategies and recommendations to stay in good physical health and enhance quality of life, such as prevention strategies, having an advocate, securing a Health Care Proxy and Power of Attorney, and keeping a list of medications and system for tracking them. It also discusses how to avoid risk for bone loss.  Biology of Weight Control  Clinical staff conducted group or individual video education with verbal and written material and guidebook.  Patient learns that weight gain occurs because we consume more calories than we burn (eating more, moving less). Even if your body weight is normal, you may have higher ratios of fat compared to muscle mass. Too much body fat puts you at increased risk for cardiovascular disease, heart attack, stroke, type 2 diabetes, and obesity-related cancers. In addition to exercise, following the Pritikin Eating Plan can help reduce your risk.  Decoding Lab Results  Clinical staff conducted group or individual video education with verbal and written material and guidebook.  Patient learns that lab test reflects one measurement whose values change over time and are influenced by many factors, including medication, stress, sleep, exercise, food, hydration, pre-existing medical conditions, and more. It is recommended to use the knowledge from this video to become more involved with your lab results and evaluate your numbers to speak with your  doctor.   Diseases of Our Time - Overview  Clinical staff conducted group or individual video education with verbal and written material and guidebook.  Patient learns that according to the CDC, 50% to 70% of chronic diseases (such as obesity, type 2 diabetes, elevated lipids, hypertension, and heart disease) are avoidable through lifestyle improvements including healthier food choices, listening to satiety cues, and increased physical activity.  Sleep Disorders Clinical staff conducted group or individual video education with verbal and written material and guidebook.  Patient learns how good quality and duration of sleep are important to overall health and well-being. Patient also learns about sleep disorders and how they impact health along with recommendations to address them, including discussing with a physician.  Nutrition  Dining Out - Part 2 Clinical staff conducted group or individual video education with  verbal and written material and guidebook.  Patient learns how to plan ahead and communicate in order to maximize their dining experience in a healthy and nutritious manner. Included are recommended food choices based on the type of restaurant the patient is visiting.   Fueling a Banker conducted group or individual video education with verbal and written material and guidebook.  There is a strong connection between our food choices and our health. Diseases like obesity and type 2 diabetes are very prevalent and are in large-part due to lifestyle choices. The Pritikin Eating Plan provides plenty of food and hunger-curbing satisfaction. It is easy to follow, affordable, and helps reduce health risks.  Menu Workshop  Clinical staff conducted group or individual video education with verbal and written material and guidebook.  Patient learns that restaurant meals can sabotage health goals because they are often packed with calories, fat, sodium, and sugar.  Recommendations include strategies to plan ahead and to communicate with the manager, chef, or server to help order a healthier meal.  Planning Your Eating Strategy  Clinical staff conducted group or individual video education with verbal and written material and guidebook.  Patient learns about the Pritikin Eating Plan and its benefit of reducing the risk of disease. The Pritikin Eating Plan does not focus on calories. Instead, it emphasizes high-quality, nutrient-rich foods. By knowing the characteristics of the foods, we choose, we can determine their calorie density and make informed decisions.  Targeting Your Nutrition Priorities  Clinical staff conducted group or individual video education with verbal and written material and guidebook.  Patient learns that lifestyle habits have a tremendous impact on disease risk and progression. This video provides eating and physical activity recommendations based on your personal health goals, such as reducing LDL cholesterol, losing weight, preventing or controlling type 2 diabetes, and reducing high blood pressure.  Vitamins and Minerals  Clinical staff conducted group or individual video education with verbal and written material and guidebook.  Patient learns different ways to obtain key vitamins and minerals, including through a recommended healthy diet. It is important to discuss all supplements you take with your doctor.   Healthy Mind-Set    Smoking Cessation  Clinical staff conducted group or individual video education with verbal and written material and guidebook.  Patient learns that cigarette smoking and tobacco addiction pose a serious health risk which affects millions of people. Stopping smoking will significantly reduce the risk of heart disease, lung disease, and many forms of cancer. Recommended strategies for quitting are covered, including working with your doctor to develop a successful plan.  Culinary   Becoming a Corporate Investment Banker conducted group or individual video education with verbal and written material and guidebook.  Patient learns that cooking at home can be healthy, cost-effective, quick, and puts them in control. Keys to cooking healthy recipes will include looking at your recipe, assessing your equipment needs, planning ahead, making it simple, choosing cost-effective seasonal ingredients, and limiting the use of added fats, salts, and sugars.  Cooking - Breakfast and Snacks  Clinical staff conducted group or individual video education with verbal and written material and guidebook.  Patient learns how important breakfast is to satiety and nutrition through the entire day. Recommendations include key foods to eat during breakfast to help stabilize blood sugar levels and to prevent overeating at meals later in the day. Planning ahead is also a key component.  Cooking - Educational Psychologist conducted  group or individual video education with verbal and written material and guidebook.  Patient learns eating strategies to improve overall health, including an approach to cook more at home. Recommendations include thinking of animal protein as a side on your plate rather than center stage and focusing instead on lower calorie dense options like vegetables, fruits, whole grains, and plant-based proteins, such as beans. Making sauces in large quantities to freeze for later and leaving the skin on your vegetables are also recommended to maximize your experience.  Cooking - Healthy Salads and Dressing Clinical staff conducted group or individual video education with verbal and written material and guidebook.  Patient learns that vegetables, fruits, whole grains, and legumes are the foundations of the Pritikin Eating Plan. Recommendations include how to incorporate each of these in flavorful and healthy salads, and how to create homemade salad dressings. Proper handling of ingredients is also covered.  Cooking - Soups and [employer] - Soups and Desserts Clinical staff conducted group or individual video education with verbal and written material and guidebook.  Patient learns that Pritikin soups and desserts make for easy, nutritious, and delicious snacks and meal components that are low in sodium, fat, sugar, and calorie density, while high in vitamins, minerals, and filling fiber. Recommendations include simple and healthy ideas for soups and desserts.   Overview     The Pritikin Solution Program Overview Clinical staff conducted group or individual video education with verbal and written material and guidebook.  Patient learns that the results of the Pritikin Program have been documented in more than 100 articles published in peer-reviewed journals, and the benefits include reducing risk factors for (and, in some cases, even reversing) high cholesterol, high blood pressure, type 2 diabetes, obesity, and more! An overview of the three key pillars of the Pritikin Program will be covered: eating well, doing regular exercise, and having a healthy mind-set.  WORKSHOPS  Exercise: Exercise Basics: Building Your Action Plan Clinical staff led group instruction and group discussion with PowerPoint presentation and patient guidebook. To enhance the learning environment the use of posters, models and videos may be added. At the conclusion of this workshop, patients will comprehend the difference between physical activity and exercise, as well as the benefits of incorporating both, into their routine. Patients will understand the FITT (Frequency, Intensity, Time, and Type) principle and how to use it to build an exercise action plan. In addition, safety concerns and other considerations for exercise and cardiac rehab will be addressed by the presenter. The purpose of this lesson is to promote a comprehensive and effective weekly exercise routine in order to improve patients' overall level of  fitness.   Managing Heart Disease: Your Path to a Healthier Heart Clinical staff led group instruction and group discussion with PowerPoint presentation and patient guidebook. To enhance the learning environment the use of posters, models and videos may be added.At the conclusion of this workshop, patients will understand the anatomy and physiology of the heart. Additionally, they will understand how Pritikin's three pillars impact the risk factors, the progression, and the management of heart disease.  The purpose of this lesson is to provide a high-level overview of the heart, heart disease, and how the Pritikin lifestyle positively impacts risk factors.  Exercise Biomechanics Clinical staff led group instruction and group discussion with PowerPoint presentation and patient guidebook. To enhance the learning environment the use of posters, models and videos may be added. Patients will learn how the structural parts of their bodies function  and how these functions impact their daily activities, movement, and exercise. Patients will learn how to promote a neutral spine, learn how to manage pain, and identify ways to improve their physical movement in order to promote healthy living. The purpose of this lesson is to expose patients to common physical limitations that impact physical activity. Participants will learn practical ways to adapt and manage aches and pains, and to minimize their effect on regular exercise. Patients will learn how to maintain good posture while sitting, walking, and lifting.  Balance Training and Fall Prevention  Clinical staff led group instruction and group discussion with PowerPoint presentation and patient guidebook. To enhance the learning environment the use of posters, models and videos may be added. At the conclusion of this workshop, patients will understand the importance of their sensorimotor skills (vision, proprioception, and the vestibular system)  in maintaining their ability to balance as they age. Patients will apply a variety of balancing exercises that are appropriate for their current level of function. Patients will understand the common causes for poor balance, possible solutions to these problems, and ways to modify their physical environment in order to minimize their fall risk. The purpose of this lesson is to teach patients about the importance of maintaining balance as they age and ways to minimize their risk of falling.  WORKSHOPS   Nutrition:  Fueling a Ship Broker led group instruction and group discussion with PowerPoint presentation and patient guidebook. To enhance the learning environment the use of posters, models and videos may be added. Patients will review the foundational principles of the Pritikin Eating Plan and understand what constitutes a serving size in each of the food groups. Patients will also learn Pritikin-friendly foods that are better choices when away from home and review make-ahead meal and snack options. Calorie density will be reviewed and applied to three nutrition priorities: weight maintenance, weight loss, and weight gain. The purpose of this lesson is to reinforce (in a group setting) the key concepts around what patients are recommended to eat and how to apply these guidelines when away from home by planning and selecting Pritikin-friendly options. Patients will understand how calorie density may be adjusted for different weight management goals.  Mindful Eating  Clinical staff led group instruction and group discussion with PowerPoint presentation and patient guidebook. To enhance the learning environment the use of posters, models and videos may be added. Patients will briefly review the concepts of the Pritikin Eating Plan and the importance of low-calorie dense foods. The concept of mindful eating will be introduced as well as the importance of paying attention to internal hunger  signals. Triggers for non-hunger eating and techniques for dealing with triggers will be explored. The purpose of this lesson is to provide patients with the opportunity to review the basic principles of the Pritikin Eating Plan, discuss the value of eating mindfully and how to measure internal cues of hunger and fullness using the Hunger Scale. Patients will also discuss reasons for non-hunger eating and learn strategies to use for controlling emotional eating.  Targeting Your Nutrition Priorities Clinical staff led group instruction and group discussion with PowerPoint presentation and patient guidebook. To enhance the learning environment the use of posters, models and videos may be added. Patients will learn how to determine their genetic susceptibility to disease by reviewing their family history. Patients will gain insight into the importance of diet as part of an overall healthy lifestyle in mitigating the impact of genetics and other environmental  insults. The purpose of this lesson is to provide patients with the opportunity to assess their personal nutrition priorities by looking at their family history, their own health history and current risk factors. Patients will also be able to discuss ways of prioritizing and modifying the Pritikin Eating Plan for their highest risk areas  Menu  Clinical staff led group instruction and group discussion with PowerPoint presentation and patient guidebook. To enhance the learning environment the use of posters, models and videos may be added. Using menus brought in from [employer], or printed from [employer], patients will apply the Pritikin dining out guidelines that were presented in the [employer] video. Patients will also be able to practice these guidelines in a variety of provided scenarios. The purpose of this lesson is to provide patients with the opportunity to practice hands-on learning of the Pritikin Dining Out guidelines  with actual menus and practice scenarios.  Label Reading Clinical staff led group instruction and group discussion with PowerPoint presentation and patient guidebook. To enhance the learning environment the use of posters, models and videos may be added. Patients will review and discuss the Pritikin label reading guidelines presented in Pritikin's Label Reading Educational series video. Using fool labels brought in from local grocery stores and markets, patients will apply the label reading guidelines and determine if the packaged food meet the Pritikin guidelines. The purpose of this lesson is to provide patients with the opportunity to review, discuss, and practice hands-on learning of the Pritikin Label Reading guidelines with actual packaged food labels. Cooking School  Pritikin's [employer] are designed to teach patients ways to prepare quick, simple, and affordable recipes at home. The importance of nutrition's role in chronic disease risk reduction is reflected in its emphasis in the overall Pritikin program. By learning how to prepare essential core Pritikin Eating Plan recipes, patients will increase control over what they eat; be able to customize the flavor of foods without the use of added salt, sugar, or fat; and improve the quality of the food they consume. By learning a set of core recipes which are easily assembled, quickly prepared, and affordable, patients are more likely to prepare more healthy foods at home. These workshops focus on convenient breakfasts, simple entres, side dishes, and desserts which can be prepared with minimal effort and are consistent with nutrition recommendations for cardiovascular risk reduction. Cooking [employer] are taught by a armed forces logistics/support/administrative officer (RD) who has been trained by the [employer]. The chef or RD has a clear understanding of the importance of minimizing - if not completely eliminating - added fat, sugar, and  sodium in recipes. Throughout the series of Cooking School Workshop sessions, patients will learn about healthy ingredients and efficient methods of cooking to build confidence in their capability to prepare    Cooking School weekly topics:  Adding Flavor- Sodium-Free  Fast and Healthy Breakfasts  Powerhouse Plant-Based Proteins  Satisfying Salads and Dressings  Simple Sides and Sauces  International Cuisine-Spotlight on the [employer] Zones  Delicious Desserts  Savory Soups  [employer] - Meals in a Astronomer Appetizers and Snacks  Comforting Weekend Breakfasts  One-Pot Wonders   Fast Evening Meals  Landscape Architect Your Pritikin Plate  WORKSHOPS   Healthy Mindset (Psychosocial):  Focused Goals, Sustainable Changes Clinical staff led group instruction and group discussion with PowerPoint presentation and patient guidebook. To enhance the learning environment the use of posters, models and videos  may be added. Patients will be able to apply effective goal setting strategies to establish at least one personal goal, and then take consistent, meaningful action toward that goal. They will learn to identify common barriers to achieving personal goals and develop strategies to overcome them. Patients will also gain an understanding of how our mind-set can impact our ability to achieve goals and the importance of cultivating a positive and growth-oriented mind-set. The purpose of this lesson is to provide patients with a deeper understanding of how to set and achieve personal goals, as well as the tools and strategies needed to overcome common obstacles which may arise along the way.  From Head to Heart: The Power of a Healthy Outlook  Clinical staff led group instruction and group discussion with PowerPoint presentation and patient guidebook. To enhance the learning environment the use of posters, models and videos may be added. Patients will be able to recognize and  describe the impact of emotions and mood on physical health. They will discover the importance of self-care and explore self-care practices which may work for them. Patients will also learn how to utilize the 4 C's to cultivate a healthier outlook and better manage stress and challenges. The purpose of this lesson is to demonstrate to patients how a healthy outlook is an essential part of maintaining good health, especially as they continue their cardiac rehab journey.  Healthy Sleep for a Healthy Heart Clinical staff led group instruction and group discussion with PowerPoint presentation and patient guidebook. To enhance the learning environment the use of posters, models and videos may be added. At the conclusion of this workshop, patients will be able to demonstrate knowledge of the importance of sleep to overall health, well-being, and quality of life. They will understand the symptoms of, and treatments for, common sleep disorders. Patients will also be able to identify daytime and nighttime behaviors which impact sleep, and they will be able to apply these tools to help manage sleep-related challenges. The purpose of this lesson is to provide patients with a general overview of sleep and outline the importance of quality sleep. Patients will learn about a few of the most common sleep disorders. Patients will also be introduced to the concept of "sleep hygiene," and discover ways to self-manage certain sleeping problems through simple daily behavior changes. Finally, the workshop will motivate patients by clarifying the links between quality sleep and their goals of heart-healthy living.   Recognizing and Reducing Stress Clinical staff led group instruction and group discussion with PowerPoint presentation and patient guidebook. To enhance the learning environment the use of posters, models and videos may be added. At the conclusion of this workshop, patients will be able to understand the types of stress  reactions, differentiate between acute and chronic stress, and recognize the impact that chronic stress has on their health. They will also be able to apply different coping mechanisms, such as reframing negative self-talk. Patients will have the opportunity to practice a variety of stress management techniques, such as deep abdominal breathing, progressive muscle relaxation, and/or guided imagery.  The purpose of this lesson is to educate patients on the role of stress in their lives and to provide healthy techniques for coping with it.  Learning Barriers/Preferences:  Learning Barriers/Preferences - 01/28/24 1542       Learning Barriers/Preferences   Learning Barriers Sight    Learning Preferences Group Instruction;Individual Instruction;Pictoral;Skilled Demonstration;Verbal Instruction             Education Topics:  Knowledge Questionnaire Score:  Knowledge Questionnaire Score - 01/28/24 1538       Knowledge Questionnaire Score   Pre Score 23/24             Core Components/Risk Factors/Patient Goals at Admission:  Personal Goals and Risk Factors at Admission - 01/28/24 1354       Core Components/Risk Factors/Patient Goals on Admission    Weight Management Weight Loss    Tobacco Cessation --   Quit Smoking 40 years ago   Diabetes Yes    Intervention Provide education about signs/symptoms and action to take for hypo/hyperglycemia.;Provide education about proper nutrition, including hydration, and aerobic/resistive exercise prescription along with prescribed medications to achieve blood glucose in normal ranges: Fasting glucose 65-99 mg/dL    Expected Outcomes Short Term: Participant verbalizes understanding of the signs/symptoms and immediate care of hyper/hypoglycemia, proper foot care and importance of medication, aerobic/resistive exercise and nutrition plan for blood glucose control.;Long Term: Attainment of HbA1C < 7%.    Hypertension Yes    Intervention Provide  education on lifestyle modifcations including regular physical activity/exercise, weight management, moderate sodium restriction and increased consumption of fresh fruit, vegetables, and low fat dairy, alcohol moderation, and smoking cessation.;Monitor prescription use compliance.    Expected Outcomes Short Term: Continued assessment and intervention until BP is < 140/88mm HG in hypertensive participants. < 130/29mm HG in hypertensive participants with diabetes, heart failure or chronic kidney disease.;Long Term: Maintenance of blood pressure at goal levels.    Lipids Yes    Intervention Provide education and support for participant on nutrition & aerobic/resistive exercise along with prescribed medications to achieve LDL 70mg , HDL >40mg .    Expected Outcomes Short Term: Participant states understanding of desired cholesterol values and is compliant with medications prescribed. Participant is following exercise prescription and nutrition guidelines.;Long Term: Cholesterol controlled with medications as prescribed, with individualized exercise RX and with personalized nutrition plan. Value goals: LDL < 70mg , HDL > 40 mg.    Stress Yes   self-imposed   Intervention Offer individual and/or small group education and counseling on adjustment to heart disease, stress management and health-related lifestyle change. Teach and support self-help strategies.;Refer participants experiencing significant psychosocial distress to appropriate mental health specialists for further evaluation and treatment. When possible, include family members and significant others in education/counseling sessions.    Expected Outcomes Short Term: Participant demonstrates changes in health-related behavior, relaxation and other stress management skills, ability to obtain effective social support, and compliance with psychotropic medications if prescribed.;Long Term: Emotional wellbeing is indicated by absence of clinically significant  psychosocial distress or social isolation.             Core Components/Risk Factors/Patient Goals Review:    Core Components/Risk Factors/Patient Goals at Discharge (Final Review):    ITP Comments:  ITP Comments     Row Name 01/28/24 1347           ITP Comments Dr. Wilbert Bihari medical director. Introduction to pritikin education/intensive cardiac rehab. Initial orientation packet reviewed with patient.                Comments: Pt completed CR orientation   Service Time: 1325-1520

## 2024-01-28 NOTE — Progress Notes (Signed)
 Cardiac Rehab Medication Review   Does the patient  feel that his/her medications are working for him/her?  Yes  Has the patient been experiencing any side effects to the medications prescribed?  Rare Light headiness    Does the patient measure his/her own blood pressure or blood glucose at home?   Yes  Does the patient have any problems obtaining medications due to transportation or finances?   No  Understanding of regimen: excellent Understanding of indications: excellent Potential of compliance: excellent    Comments: Meds Reviewed     Nathan Duncan Candy 01/28/2024  3:54 PM

## 2024-01-28 NOTE — Telephone Encounter (Signed)
 Medication refill request complete

## 2024-02-02 ENCOUNTER — Encounter (HOSPITAL_COMMUNITY)
Admission: RE | Admit: 2024-02-02 | Discharge: 2024-02-02 | Disposition: A | Discharge status: Home / Self Care | Payer: Medicare Other | Source: Ambulatory Visit | Attending: Cardiology

## 2024-02-02 DIAGNOSIS — Z48812 Encounter for surgical aftercare following surgery on the circulatory system: Secondary | ICD-10-CM | POA: Diagnosis not present

## 2024-02-02 DIAGNOSIS — Z955 Presence of coronary angioplasty implant and graft: Secondary | ICD-10-CM

## 2024-02-02 LAB — GLUCOSE, CAPILLARY
Glucose-Capillary: 287 mg/dL — ABNORMAL HIGH (ref 70–99)
Glucose-Capillary: 256 mg/dL — ABNORMAL HIGH (ref 70–99)

## 2024-02-02 NOTE — Progress Notes (Signed)
 Daily Session Note  Patient Details  Name: Nathan Duncan MRN: [id number] Date of Birth: 1951/06/20 Referring Provider:   Flowsheet Row INTENSIVE CARDIAC REHAB Duncan from 01/28/2024 in [facility]  Referring Provider Nathan Duncan       Encounter Date: 02/02/2024  Check In:  Session Check In - 02/02/24 1036       Check-In   Supervising physician immediately available to respond to emergencies [facility] MD immediately available    Physician(s) Spero Dye NP    Location [facility]    Staff Present Bella Bowman, MS, Exercise Physiologist;David Makemson, MS, ACSM-CEP, CCRP, Exercise Physiologist;Olinty Gaylene Kays, MS, ACSM-CEP, Exercise Physiologist;Maria Whitaker, RN, Emerick Hanlon, MS, Exercise Physiologist;Jetta Walker BS, ACSM-CEP, Exercise Physiologist    Virtual Visit No    Medication changes reported     No    Fall or balance concerns reported    No    Tobacco Cessation No Change    Warm-up and Cool-down Performed as group-led instruction    Resistance Training Performed Yes    VAD Patient? No    PAD/SET Patient? No      Pain Assessment   Currently in Pain? No/denies    Multiple Pain Sites No             Capillary Blood Glucose: Results for orders placed or performed during the hospital encounter of 02/02/24 (from the past 24 hours)  Glucose, capillary     Status: Abnormal   Collection Time: 02/02/24 11:28 AM  Result Value Ref Range   Glucose-Capillary 256 (H) 70 - 99 mg/dL     Exercise Prescription Changes - 02/02/24 1027       Response to Exercise   Blood Pressure (Admit) 118/64    Blood Pressure (Exercise) 152/68    Blood Pressure (Exit) 122/60    Heart Rate (Admit) 82 bpm    Heart Rate (Exercise) 118 bpm    Heart Rate (Exit) 91 bpm    Rating of Perceived Exertion (Exercise) 11    Symptoms None    Comments Off to a good start with exercise.    Duration Continue with 30  min of aerobic exercise without signs/symptoms of physical distress.    Intensity THRR unchanged      Progression   Progression Continue to progress workloads to maintain intensity without signs/symptoms of physical distress.    Average METs 2.7      Resistance Training   Training Prescription Yes    Weight 4lbs    Reps 10-15    Time 10 Minutes      Interval Training   Interval Training No      Treadmill   MPH 2.5    Grade 0    Minutes 15    METs 2.91      Bike   Level 1.5    Watts 18    Minutes 15    METs 2.5             Social History   Tobacco Use  Smoking Status Former  Smokeless Tobacco Never    Goals Met:  Exercise tolerated well No report of concerns or symptoms today Strength training completed today  Goals Unmet:  Not Applicable  Comments: [name] started cardiac rehab today. Pt tolerated light exercise without difficulty. VSS, telemetry NSR.  Medication list reconciled. Pt denies barriers to medicaiton compliance. PSYCHOSOCIAL ASSESSMENT:  PHQ 2 & 9 scores are 0 & 1. Pt  denies nay needs at this time. He stated he has good support from his family. He denies any needs or interventions at this time. 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 Pt oriented to exercise equipment and routine. Understanding verbalized.       QUALITY OF LIFE SCORE REVIEW  Pt completed Quality of Life survey as a participant in Cardiac Rehab. Scores 21.0 or below are considered low. Overall 25.21, Health and Function 24.3, socioeconomic 25.25, physiological and spiritual 23.71, family 30. Patient excited to start CR program. Will continue to monitor, re-evaluate, and intervene as necessary.        Dr. Gaylyn Keas is Medical Director for Cardiac Rehab at [facility].

## 2024-02-04 ENCOUNTER — Encounter (HOSPITAL_COMMUNITY)
Admission: RE | Admit: 2024-02-04 | Discharge: 2024-02-04 | Disposition: A | Discharge status: Home / Self Care | Payer: Medicare Other | Source: Ambulatory Visit | Attending: Cardiology | Admitting: Cardiology

## 2024-02-04 DIAGNOSIS — Z955 Presence of coronary angioplasty implant and graft: Secondary | ICD-10-CM

## 2024-02-04 DIAGNOSIS — Z48812 Encounter for surgical aftercare following surgery on the circulatory system: Secondary | ICD-10-CM | POA: Diagnosis not present

## 2024-02-06 ENCOUNTER — Encounter (HOSPITAL_COMMUNITY)
Admission: RE | Admit: 2024-02-06 | Discharge: 2024-02-06 | Disposition: A | Discharge status: Home / Self Care | Payer: Medicare Other | Source: Ambulatory Visit | Attending: Cardiology | Admitting: Cardiology

## 2024-02-06 DIAGNOSIS — Z955 Presence of coronary angioplasty implant and graft: Secondary | ICD-10-CM | POA: Diagnosis not present

## 2024-02-06 DIAGNOSIS — Z48812 Encounter for surgical aftercare following surgery on the circulatory system: Secondary | ICD-10-CM | POA: Diagnosis not present

## 2024-02-09 ENCOUNTER — Encounter (HOSPITAL_COMMUNITY)
Admission: RE | Admit: 2024-02-09 | Discharge: 2024-02-09 | Disposition: A | Discharge status: Home / Self Care | Payer: Medicare Other | Source: Ambulatory Visit | Attending: Cardiology | Admitting: Cardiology

## 2024-02-09 DIAGNOSIS — Z48812 Encounter for surgical aftercare following surgery on the circulatory system: Secondary | ICD-10-CM | POA: Diagnosis not present

## 2024-02-09 DIAGNOSIS — Z955 Presence of coronary angioplasty implant and graft: Secondary | ICD-10-CM | POA: Diagnosis not present

## 2024-02-11 ENCOUNTER — Encounter (HOSPITAL_COMMUNITY)
Admission: RE | Admit: 2024-02-11 | Discharge: 2024-02-11 | Disposition: A | Discharge status: Home / Self Care | Payer: Medicare Other | Source: Ambulatory Visit | Attending: Cardiology | Admitting: Cardiology

## 2024-02-11 DIAGNOSIS — Z48812 Encounter for surgical aftercare following surgery on the circulatory system: Secondary | ICD-10-CM | POA: Diagnosis not present

## 2024-02-11 DIAGNOSIS — Z955 Presence of coronary angioplasty implant and graft: Secondary | ICD-10-CM | POA: Diagnosis not present

## 2024-02-13 ENCOUNTER — Encounter (HOSPITAL_COMMUNITY)
Admission: RE | Admit: 2024-02-13 | Discharge: 2024-02-13 | Disposition: A | Discharge status: Home / Self Care | Payer: Medicare Other | Source: Ambulatory Visit | Attending: Cardiology | Admitting: Cardiology

## 2024-02-13 DIAGNOSIS — Z955 Presence of coronary angioplasty implant and graft: Secondary | ICD-10-CM

## 2024-02-13 DIAGNOSIS — Z48812 Encounter for surgical aftercare following surgery on the circulatory system: Secondary | ICD-10-CM | POA: Diagnosis not present

## 2024-02-16 ENCOUNTER — Encounter (HOSPITAL_COMMUNITY)
Admission: RE | Admit: 2024-02-16 | Discharge: 2024-02-16 | Disposition: A | Discharge status: Home / Self Care | Payer: Medicare Other | Source: Ambulatory Visit | Attending: Cardiology | Admitting: Cardiology

## 2024-02-16 DIAGNOSIS — Z48812 Encounter for surgical aftercare following surgery on the circulatory system: Secondary | ICD-10-CM | POA: Diagnosis not present

## 2024-02-16 DIAGNOSIS — Z955 Presence of coronary angioplasty implant and graft: Secondary | ICD-10-CM | POA: Diagnosis not present

## 2024-02-18 ENCOUNTER — Encounter (HOSPITAL_COMMUNITY)
Admission: RE | Admit: 2024-02-18 | Discharge: 2024-02-18 | Disposition: A | Discharge status: Home / Self Care | Payer: Medicare Other | Source: Ambulatory Visit | Attending: Cardiology | Admitting: Cardiology

## 2024-02-18 DIAGNOSIS — Z955 Presence of coronary angioplasty implant and graft: Secondary | ICD-10-CM

## 2024-02-18 DIAGNOSIS — Z48812 Encounter for surgical aftercare following surgery on the circulatory system: Secondary | ICD-10-CM | POA: Diagnosis not present

## 2024-02-20 ENCOUNTER — Encounter (HOSPITAL_COMMUNITY)
Admission: RE | Admit: 2024-02-20 | Discharge: 2024-02-20 | Disposition: A | Discharge status: Home / Self Care | Payer: Medicare Other | Source: Ambulatory Visit | Attending: Cardiology | Admitting: Cardiology

## 2024-02-20 DIAGNOSIS — Z955 Presence of coronary angioplasty implant and graft: Secondary | ICD-10-CM | POA: Diagnosis not present

## 2024-02-20 DIAGNOSIS — Z48812 Encounter for surgical aftercare following surgery on the circulatory system: Secondary | ICD-10-CM | POA: Diagnosis not present

## 2024-02-23 ENCOUNTER — Encounter (HOSPITAL_COMMUNITY): Payer: Medicare Other

## 2024-02-24 NOTE — Progress Notes (Signed)
 Cardiac Individual Treatment Plan  Patient Details  Name: Nathan Duncan MRN: [id number] Date of Birth: [birth year] Referring Provider:   Flowsheet Row INTENSIVE CARDIAC REHAB ORIENT from 01/28/2024 in [facility]  Referring Provider Donato Schultz       Initial Encounter Date:  Flowsheet Row INTENSIVE CARDIAC REHAB ORIENT from 01/28/2024 in [facility]  Date 01/28/24       Visit Diagnosis: 12/02/23 S/P coronary artery stent placement  Patient's Home Medications on Admission:  Current Outpatient Medications:    aspirin EC 81 MG tablet, Take 81 mg by mouth at bedtime., Disp: , Rfl:    atorvastatin (LIPITOR) 40 MG tablet, Take 40 mg by mouth daily. , Disp: , Rfl:    fluticasone (FLONASE) 50 MCG/ACT nasal spray, Place 1 spray into both nostrils daily., Disp: , Rfl:    gabapentin (NEURONTIN) 100 MG capsule, Take 1 capsule (100 mg total) by mouth at bedtime., Disp: 90 capsule, Rfl: 3   glimepiride (AMARYL) 2 MG tablet, Take 1 tablet (2 mg total) by mouth daily with breakfast., Disp: 90 tablet, Rfl: 4   icosapent Ethyl (VASCEPA) 1 g capsule, Take 2 capsules (2 g total) by mouth 2 (two) times daily., Disp: 120 capsule, Rfl: 11   insulin aspart (NOVOLOG FLEXPEN) 100 UNIT/ML FlexPen, Inject 4-5 units into skin 3 times daily before meals., Disp: 15 mL, Rfl: 4   insulin degludec (TRESIBA FLEXTOUCH) 100 UNIT/ML FlexTouch Pen, Inject once daily, adjust as directed with max dose 25 units, Disp: 6 mL, Rfl: 0   Insulin Pen Needle (B-D UF III MINI PEN NEEDLES) 31G X 5 MM MISC, USE WITH INSULIN PEN/uses 3 to 4 a day, Disp: 100 each, Rfl: 5   JARDIANCE 25 MG TABS tablet, Take 1 tablet by mouth once daily, Disp: 90 tablet, Rfl: 0   lisinopril (PRINIVIL,ZESTRIL) 5 MG tablet, Take 5 mg by mouth daily., Disp: , Rfl:    metFORMIN (GLUCOPHAGE-XR) 500 MG 24 hr tablet, Take 2 tablets (1,000 mg  total) by mouth 2 (two) times daily with a meal., Disp: 360 tablet, Rfl: 0   nitroGLYCERIN (NITROSTAT) 0.4 MG SL tablet, Place 1 tablet (0.4 mg total) under the tongue every 5 (five) minutes as needed., Disp: 25 tablet, Rfl: 2   ticagrelor (BRILINTA) 90 MG TABS tablet, Take 1 tablet (90 mg total) by mouth 2 (two) times daily., Disp: 180 tablet, Rfl: 3  Past Medical History: Past Medical History:  Diagnosis Date   Diabetes (HCC)    Dyslipidemia     Tobacco Use: Social History   Tobacco Use  Smoking Status Former  Smokeless Tobacco Never    Labs: Review Flowsheet  More data exists      Latest Ref Rng & Units 05/07/2023 07/15/2023 09/29/2023 12/02/2023 12/25/2023  Labs for ITP Cardiac and Pulmonary Rehab  Cholestrol 0 - 200 mg/dL - - - 841  -  LDL (calc) 0 - 99 mg/dL - - - 65  -  HDL-C >66 mg/dL - - - 31  -  Trlycerides <150 mg/dL - - - 063  -  Hemoglobin A1c <5.7 % of total Hgb 8.0  7.9  7.5  - 7.9     Capillary Blood Glucose: Lab Results  Component Value Date   GLUCAP 256 (H) 02/02/2024   GLUCAP 287 (H) 02/02/2024   GLUCAP 124 (H) 12/02/2023   GLUCAP 140 (H) 12/02/2023  GLUCAP 136 (H) 11/25/2023     Exercise Target Goals: Exercise Program Goal: Individual exercise prescription set using results from initial 6 min walk test and THRR while considering  patient's activity barriers and safety.   Exercise Prescription Goal: Initial exercise prescription builds to 30-45 minutes a day of aerobic activity, 2-3 days per week.  Home exercise guidelines will be given to patient during program as part of exercise prescription that the participant will acknowledge.  Activity Barriers & Risk Stratification:  Activity Barriers & Cardiac Risk Stratification - 01/28/24 1553       Activity Barriers & Cardiac Risk Stratification   Activity Barriers None    Cardiac Risk Stratification High             6 Minute Walk:  6 Minute Walk     Row Name 01/28/24 1543         6  Minute Walk   Phase Initial     Distance 1080 feet     Walk Time 6 minutes     # of Rest Breaks 0     MPH 2.04     METS 2.4     RPE 11     Perceived Dyspnea  0     VO2 Peak 8.42     Symptoms No     Resting HR 65 bpm     Resting BP 138/54     Resting Oxygen Saturation  96 %     Max Ex. HR 102 bpm     Max Ex. BP 154/50     2 Minute Post BP 130/50              Oxygen Initial Assessment:   Oxygen Re-Evaluation:   Oxygen Discharge (Final Oxygen Re-Evaluation):   Initial Exercise Prescription:  Initial Exercise Prescription - 01/28/24 1500       Date of Initial Exercise RX and Referring Provider   Date 01/28/24    Referring Provider Donato Schultz    Expected Discharge Date 05/21/24      Treadmill   MPH 2    Grade 0    Minutes 15    METs 2.5      Bike   Level 1.5    Watts 15    Minutes 15    METs 2.6      Prescription Details   Frequency (times per week) 3days/week    Duration Progress to 30 minutes of continuous aerobic without signs/symptoms of physical distress      Intensity   THRR 40-80% of Max Heartrate 58-117    Ratings of Perceived Exertion 11-13    Perceived Dyspnea 0-4      Progression   Progression Continue progressive overload as per policy without signs/symptoms or physical distress.      Resistance Training   Training Prescription Yes    Weight 4lbs    Reps 10-15             Perform Capillary Blood Glucose checks as needed.  Exercise Prescription Changes:   Exercise Prescription Changes     Row Name 02/02/24 1027 02/09/24 1031 02/20/24 1027         Response to Exercise   Blood Pressure (Admit) 118/64 122/60 130/56     Blood Pressure (Exercise) 152/68 148/62 148/62     Blood Pressure (Exit) 122/60 104/60 126/71     Heart Rate (Admit) 82 bpm 73 bpm 58 bpm     Heart Rate (Exercise) 118 bpm 126 bpm 116  bpm     Heart Rate (Exit) 91 bpm 96 bpm 85 bpm     Rating of Perceived Exertion (Exercise) 11 11 10      Symptoms None  None None     Comments Off to a good start with exercise. Increased workload on bike. --     Duration Continue with 30 min of aerobic exercise without signs/symptoms of physical distress. Continue with 30 min of aerobic exercise without signs/symptoms of physical distress. Continue with 30 min of aerobic exercise without signs/symptoms of physical distress.     Intensity THRR unchanged THRR unchanged THRR unchanged       Progression   Progression Continue to progress workloads to maintain intensity without signs/symptoms of physical distress. Continue to progress workloads to maintain intensity without signs/symptoms of physical distress. Continue to progress workloads to maintain intensity without signs/symptoms of physical distress.     Average METs 2.7 4 3.9       Resistance Training   Training Prescription Yes Yes Yes     Weight 4lbs 4lbs 4lbs     Reps 10-15 10-15 10-15     Time 10 Minutes 10 Minutes 10 Minutes       Interval Training   Interval Training No No No       Treadmill   MPH 2.5 2.9 3.3     Grade 0 1 1.5     Minutes 15 15 15      METs 2.91 3.62 4.21       Bike   Level 1.5 2.5 3.5     Watts 18 43 26     Minutes 15 15 15      METs 2.5 4.4 3.7              Exercise Comments:   Exercise Comments     Row Name 01/28/24 1547 02/02/24 1132         Exercise Comments Orientation, Pt performed w/o unusual s/s Nathan Duncan tolerated first session of exercise well without symptoms. Oriented him to the exercise and stretching routine.               Exercise Goals and Review:   Exercise Goals     Row Name 01/28/24 1548             Exercise Goals   Increase Physical Activity Yes       Intervention Provide advice, education, support and counseling about physical activity/exercise needs.;Develop an individualized exercise prescription for aerobic and resistive training based on initial evaluation findings, risk stratification, comorbidities and participant's  personal goals.       Expected Outcomes Short Term: Attend rehab on a regular basis to increase amount of physical activity.;Long Term: Exercising regularly at least 3-5 days a week.;Long Term: Add in home exercise to make exercise part of routine and to increase amount of physical activity.       Increase Strength and Stamina Yes       Intervention Provide advice, education, support and counseling about physical activity/exercise needs.;Develop an individualized exercise prescription for aerobic and resistive training based on initial evaluation findings, risk stratification, comorbidities and participant's personal goals.       Expected Outcomes Short Term: Increase workloads from initial exercise prescription for resistance, speed, and METs.;Long Term: Improve cardiorespiratory fitness, muscular endurance and strength as measured by increased METs and functional capacity ( );Short Term: Perform resistance training exercises routinely during rehab and add in resistance training at home       Able  to understand and use rate of perceived exertion (RPE) scale Yes       Intervention Provide education and explanation on how to use RPE scale       Expected Outcomes Short Term: Able to use RPE daily in rehab to express subjective intensity level;Long Term:  Able to use RPE to guide intensity level when exercising independently       Knowledge and understanding of Target Heart Rate Range (THRR) Yes       Intervention Provide education and explanation of THRR including how the numbers were predicted and where they are located for reference       Expected Outcomes Short Term: Able to state/look up THRR;Long Term: Able to use THRR to govern intensity when exercising independently;Short Term: Able to use daily as guideline for intensity in rehab       Understanding of Exercise Prescription Yes       Intervention Provide education, explanation, and written materials on patient's individual exercise prescription        Expected Outcomes Short Term: Able to explain program exercise prescription;Long Term: Able to explain home exercise prescription to exercise independently                Exercise Goals Re-Evaluation :  Exercise Goals Re-Evaluation     Row Name 02/02/24 1132             Exercise Goal Re-Evaluation   Exercise Goals Review Increase Physical Activity;Increase Strength and Stamina;Able to understand and use rate of perceived exertion (RPE) scale       Comments Nathan Duncan was able to understand and use RPE scale appropriately.       Expected Outcomes Progress workloads as tolerated to help increase strength and stamina.                Discharge Exercise Prescription (Final Exercise Prescription Changes):  Exercise Prescription Changes - 02/20/24 1027       Response to Exercise   Blood Pressure (Admit) 130/56    Blood Pressure (Exercise) 148/62    Blood Pressure (Exit) 126/71    Heart Rate (Admit) 58 bpm    Heart Rate (Exercise) 116 bpm    Heart Rate (Exit) 85 bpm    Rating of Perceived Exertion (Exercise) 10    Symptoms None    Duration Continue with 30 min of aerobic exercise without signs/symptoms of physical distress.    Intensity THRR unchanged      Progression   Progression Continue to progress workloads to maintain intensity without signs/symptoms of physical distress.    Average METs 3.9      Resistance Training   Training Prescription Yes    Weight 4lbs    Reps 10-15    Time 10 Minutes      Interval Training   Interval Training No      Treadmill   MPH 3.3    Grade 1.5    Minutes 15    METs 4.21      Bike   Level 3.5    Watts 26    Minutes 15    METs 3.7             Nutrition:  Target Goals: Understanding of nutrition guidelines, daily intake of sodium 1500mg , cholesterol 200mg , calories 30% from fat and 7% or less from saturated fats, daily to have 5 or more servings of fruits and vegetables.  Biometrics:  Pre Biometrics - 01/28/24  1547       Pre  Biometrics   Height 5\' 11"  (1.803 m)    Weight 88 kg    Waist Circumference 44 inches    Hip Circumference 42 inches    Waist to Hip Ratio 1.05 %    BMI (Calculated) 27.07    Triceps Skinfold 14 mm    % Body Fat 28.9 %    Grip Strength 42 kg    Flexibility 14 in    Single Leg Stand 7.97 seconds              Nutrition Therapy Plan and Nutrition Goals:  Nutrition Therapy & Goals - 02/19/24 1028       Nutrition Therapy   Diet Heart healthy/carbohydrate consistent diet    Drug/Food Interactions Statins/Certain Fruits      Personal Nutrition Goals   Nutrition Goal Patient to identify strategies for reducing cardiovascular risk by attending the [facility] education and nutrition series weekly.   goal in progress.   Personal Goal #2 Patient to improve diet quality by using the plate method as a guide for meal planning to include lean protein/plant protein, fruits, vegetables, whole grains, nonfat dairy as part of a well-balanced diet.   goal in progress.   Comments Goals in progress. Nathan Duncan has medical history of s/p coronary artery stent placement, HTN, hyperlipidemia, DM2, OSA. Triglycerides remain elevated (vascepa). LDL is at goal. A1c is not at goal. Nathan Duncan does continue to attend the [facility] education and nutrition series regularly. He is up 6# since starting with our program. Patient will benefit from participation in intensive cardiac rehab for nutrition, exercise, and lifestyle modification.      Intervention Plan   Intervention Prescribe, educate and counsel regarding individualized specific dietary modifications aiming towards targeted core components such as weight, hypertension, lipid management, diabetes, heart failure and other comorbidities.;Nutrition handout(s) given to patient.    Expected Outcomes Long Term Goal: Adherence to prescribed nutrition plan.;Short Term Goal: Understand basic principles of dietary content, such as calories, fat, sodium,  cholesterol and nutrients.             Nutrition Assessments:  MEDIFICTS Score Key: >=70 Need to make dietary changes  40-70 Heart Healthy Diet <= 40 Therapeutic Level Cholesterol Diet    Picture Your Plate Scores: <16 Unhealthy dietary pattern with much room for improvement. 41-50 Dietary pattern unlikely to meet recommendations for good health and room for improvement. 51-60 More healthful dietary pattern, with some room for improvement.  >60 Healthy dietary pattern, although there may be some specific behaviors that could be improved.    Nutrition Goals Re-Evaluation:  Nutrition Goals Re-Evaluation     Row Name 02/19/24 1028             Goals   Current Weight 194 lb 14.2 oz (88.4 kg)       Comment A1c 7.9, triglycerides 238, HDL 38, LDL 65       Expected Outcome Goals in progress. Nathan Duncan has medical history of s/p coronary artery stent placement, HTN, hyperlipidemia, DM2, OSA. Triglycerides remain elevated (vascepa). LDL is at goal. A1c is not at goal. Nathan Duncan does continue to attend the [facility] education and nutrition series regularly. He is up 6# since starting with our program. Patient will benefit from participation in intensive cardiac rehab for nutrition, exercise, and lifestyle modification.                Nutrition Goals Re-Evaluation:  Nutrition Goals Re-Evaluation     Row Name 02/19/24 1028  Goals   Current Weight 194 lb 14.2 oz (88.4 kg)       Comment A1c 7.9, triglycerides 238, HDL 38, LDL 65       Expected Outcome Goals in progress. Nathan Duncan has medical history of s/p coronary artery stent placement, HTN, hyperlipidemia, DM2, OSA. Triglycerides remain elevated (vascepa). LDL is at goal. A1c is not at goal. Nathan Duncan does continue to attend the [facility] education and nutrition series regularly. He is up 6# since starting with our program. Patient will benefit from participation in intensive cardiac rehab for nutrition, exercise, and lifestyle  modification.                Nutrition Goals Discharge (Final Nutrition Goals Re-Evaluation):  Nutrition Goals Re-Evaluation - 02/19/24 1028       Goals   Current Weight 194 lb 14.2 oz (88.4 kg)    Comment A1c 7.9, triglycerides 238, HDL 38, LDL 65    Expected Outcome Goals in progress. Nathan Duncan has medical history of s/p coronary artery stent placement, HTN, hyperlipidemia, DM2, OSA. Triglycerides remain elevated (vascepa). LDL is at goal. A1c is not at goal. Nathan Duncan does continue to attend the [facility] education and nutrition series regularly. He is up 6# since starting with our program. Patient will benefit from participation in intensive cardiac rehab for nutrition, exercise, and lifestyle modification.             Psychosocial: Target Goals: Acknowledge presence or absence of significant depression and/or stress, maximize coping skills, provide positive support system. Participant is able to verbalize types and ability to use techniques and skills needed for reducing stress and depression.  Initial Review & Psychosocial Screening:   Quality of Life Scores:  Quality of Life - 01/28/24 1542       Quality of Life   Select Quality of Life      Quality of Life Scores   Health/Function Pre 24.3 %    Socioeconomic Pre 25.25 %    Psych/Spiritual Pre 23.71 %    Family Pre 30 %    GLOBAL Pre 25.21 %            Scores of 19 and below usually indicate a poorer quality of life in these areas.  A difference of  2-3 points is a clinically meaningful difference.  A difference of 2-3 points in the total score of the Quality of Life Index has been associated with significant improvement in overall quality of life, self-image, physical symptoms, and general health in studies assessing change in quality of life.  PHQ-9: Review Flowsheet       02/02/2024 01/28/2024 05/13/2022 11/07/2016  Depression screen PHQ 2/9  Decreased Interest 0 3 0 0 0  Down, Depressed, Hopeless 0 1 0 0 1  PHQ -  2 Score 0 4 0 0 1  Altered sleeping 0 1 - -  Tired, decreased energy 1 1 - -  Change in appetite 0 0 - -  Feeling bad or failure about yourself  0 0 - -  Trouble concentrating 0 0 - -  Moving slowly or fidgety/restless 0 1 - -  Suicidal thoughts 0 0 - -  PHQ-9 Score 1 7 - -  Difficult doing work/chores Not difficult at all Not difficult at all - -    Details       Multiple values from one day are sorted in reverse-chronological order        Interpretation of Total Score  Total Score Depression Severity:  1-4 =  Minimal depression, 5-9 = Mild depression, 10-14 = Moderate depression, 15-19 = Moderately severe depression, 20-27 = Severe depression   Psychosocial Evaluation and Intervention:   Psychosocial Re-Evaluation:  Psychosocial Re-Evaluation     Row Name 02/03/24 1347 02/23/24 0956           Psychosocial Re-Evaluation   Current issues with None Identified None Identified      Interventions Encouraged to attend Cardiac Rehabilitation for the exercise Encouraged to attend Cardiac Rehabilitation for the exercise      Continue Psychosocial Services  No Follow up required No Follow up required               Psychosocial Discharge (Final Psychosocial Re-Evaluation):  Psychosocial Re-Evaluation - 02/23/24 0956       Psychosocial Re-Evaluation   Current issues with None Identified    Interventions Encouraged to attend Cardiac Rehabilitation for the exercise    Continue Psychosocial Services  No Follow up required             Vocational Rehabilitation: Provide vocational rehab assistance to qualifying candidates.   Vocational Rehab Evaluation & Intervention:  Vocational Rehab - 01/28/24 1542       Initial Vocational Rehab Evaluation & Intervention   Assessment shows need for Vocational Rehabilitation No             Education: Education Goals: Education classes will be provided on a weekly basis, covering required topics. Participant will state  understanding/return demonstration of topics presented.    Education     Row Name 02/02/24 1100     Education   Cardiac Education Topics Pritikin   Select Workshops     Workshops   Educator Exercise Physiologist   Select Exercise   Exercise Workshop Location manager and Fall Prevention   Instruction Review Code 1- Verbalizes Understanding   Class Start Time 1142   Class Stop Time 1224   Class Time Calculation (min) 42 min    Row Name 02/04/24 1000     Education   Cardiac Education Topics Pritikin   Set designer Exercise Physiologist   Weekly Topic Adding Flavor - Sodium-Free   Instruction Review Code 1- Verbalizes Understanding   Class Start Time 1145   Class Stop Time 1225   Class Time Calculation (min) 40 min    Row Name 02/06/24 1500     Education   Cardiac Education Topics Pritikin   Licensed conveyancer Nutrition   Nutrition Overview of the Pritikin Eating Plan   Instruction Review Code 1- Verbalizes Understanding   Class Start Time 1145   Class Stop Time 1238   Class Time Calculation (min) 53 min    Row Name 02/09/24 1100     Education   Cardiac Education Topics Pritikin   Nurse, children's Exercise Physiologist   Select Psychosocial   Psychosocial Healthy Minds, Bodies, Hearts   Instruction Review Code 1- Verbalizes Understanding   Class Start Time 1155   Class Stop Time 1240   Class Time Calculation (min) 45 min    Row Name 02/11/24 1200     Education   Cardiac Education Topics Pritikin   Dentist   Weekly Topic Fast and Healthy Breakfasts   Instruction Review Code 1- [employer] Understanding  Class Start Time 1143   Class Stop Time 1215   Class Time Calculation (min) 32 min            Core Videos: Exercise    Move It!  Clinical staff conducted group or individual  video education with verbal and written material and guidebook.  Patient learns the recommended Pritikin exercise program. Exercise with the goal of living a long, healthy life. Some of the health benefits of exercise include controlled diabetes, healthier blood pressure levels, improved cholesterol levels, improved heart and lung capacity, improved sleep, and better body composition. Everyone should speak with their doctor before starting or changing an exercise routine.  Biomechanical Limitations Clinical staff conducted group or individual video education with verbal and written material and guidebook.  Patient learns how biomechanical limitations can impact exercise and how we can mitigate and possibly overcome limitations to have an impactful and balanced exercise routine.  Body Composition Clinical staff conducted group or individual video education with verbal and written material and guidebook.  Patient learns that body composition (ratio of muscle mass to fat mass) is a key component to assessing overall fitness, rather than body weight alone. Increased fat mass, especially visceral belly fat, can put [place] at increased risk for metabolic syndrome, type 2 diabetes, heart disease, and even death. It is recommended to combine diet and exercise (cardiovascular and resistance training) to improve your body composition. Seek guidance from your physician and exercise physiologist before implementing an exercise routine.  Exercise Action Plan Clinical staff conducted group or individual video education with verbal and written material and guidebook.  Patient learns the recommended strategies to achieve and enjoy long-term exercise adherence, including variety, self-motivation, self-efficacy, and positive decision making. Benefits of exercise include fitness, good health, weight management, more energy, better sleep, less stress, and overall well-being.  Medical   Heart Disease Risk Reduction Clinical  staff conducted group or individual video education with verbal and written material and guidebook.  Patient learns our heart is our most vital organ as it circulates oxygen, nutrients, white blood cells, and hormones throughout the entire body, and carries waste away. Data supports a plant-based eating plan like the Pritikin Program for its effectiveness in slowing progression of and reversing heart disease. The video provides a number of recommendations to address heart disease.   Metabolic Syndrome and Belly Fat  Clinical staff conducted group or individual video education with verbal and written material and guidebook.  Patient learns what metabolic syndrome is, how it leads to heart disease, and how one can reverse it and keep it from coming back. You have metabolic syndrome if you have 3 of the following 5 criteria: abdominal obesity, high blood pressure, high triglycerides, low HDL cholesterol, and high blood sugar.  Hypertension and Heart Disease Clinical staff conducted group or individual video education with verbal and written material and guidebook.  Patient learns that high blood pressure, or hypertension, is very common in the [place]. Hypertension is largely due to excessive salt intake, but other important risk factors include being overweight, physical inactivity, drinking too much alcohol, smoking, and not eating enough potassium from fruits and vegetables. High blood pressure is a leading risk factor for heart attack, stroke, congestive heart failure, dementia, kidney failure, and premature death. Long-term effects of excessive salt intake include stiffening of the arteries and thickening of heart muscle and organ damage. Recommendations include ways to reduce hypertension and the risk of heart disease.  Diseases of Our Time - Focusing on  Diabetes Clinical staff conducted group or individual video education with verbal and written material and guidebook.  Patient learns why the  best way to stop diseases of our time is prevention, through food and other lifestyle changes. Medicine (such as prescription pills and surgeries) is often only a Band-Aid on the problem, not a long-term solution. Most common diseases of our time include obesity, type 2 diabetes, hypertension, heart disease, and cancer. The Pritikin Program is recommended and has been proven to help reduce, reverse, and/or prevent the damaging effects of metabolic syndrome.  Nutrition   Overview of the Pritikin Eating Plan  Clinical staff conducted group or individual video education with verbal and written material and guidebook.  Patient learns about the Pritikin Eating Plan for disease risk reduction. The Pritikin Eating Plan emphasizes a wide variety of unrefined, minimally-processed carbohydrates, like fruits, vegetables, whole grains, and legumes. Go, Caution, and Stop food choices are explained. Plant-based and lean animal proteins are emphasized. Rationale provided for low sodium intake for blood pressure control, low added sugars for blood sugar stabilization, and low added fats and oils for coronary artery disease risk reduction and weight management.  Calorie Density  Clinical staff conducted group or individual video education with verbal and written material and guidebook.  Patient learns about calorie density and how it impacts the Pritikin Eating Plan. Knowing the characteristics of the food you choose will help you decide whether those foods will lead to weight gain or weight loss, and whether you want to consume more or less of them. Weight loss is usually a side effect of the Pritikin Eating Plan because of its focus on low calorie-dense foods.  Label Reading  Clinical staff conducted group or individual video education with verbal and written material and guidebook.  Patient learns about the Pritikin recommended label reading guidelines and corresponding recommendations regarding calorie density,  added sugars, sodium content, and whole grains.  Dining Out - Part 1  Clinical staff conducted group or individual video education with verbal and written material and guidebook.  Patient learns that restaurant meals can be sabotaging because they can be so high in calories, fat, sodium, and/or sugar. Patient learns recommended strategies on how to positively address this and avoid unhealthy pitfalls.  Facts on Fats  Clinical staff conducted group or individual video education with verbal and written material and guidebook.  Patient learns that lifestyle modifications can be just as effective, if not more so, as many medications for lowering your risk of heart disease. A Pritikin lifestyle can help to reduce your risk of inflammation and atherosclerosis (cholesterol build-up, or plaque, in the artery walls). Lifestyle interventions such as dietary choices and physical activity address the cause of atherosclerosis. A review of the types of fats and their impact on blood cholesterol levels, along with dietary recommendations to reduce fat intake is also included.  Nutrition Action Plan  Clinical staff conducted group or individual video education with verbal and written material and guidebook.  Patient learns how to incorporate Pritikin recommendations into their lifestyle. Recommendations include planning and keeping personal health goals in mind as an important part of their success.  Healthy Mind-Set    Healthy Minds, Bodies, Hearts  Clinical staff conducted group or individual video education with verbal and written material and guidebook.  Patient learns how to identify when they are stressed. Video will discuss the impact of that stress, as well as the many benefits of stress management. Patient will also be introduced to stress management techniques.  The way we think, act, and feel has an impact on our hearts.  How Our Thoughts Can Heal Our Hearts  Clinical staff conducted group or  individual video education with verbal and written material and guidebook.  Patient learns that negative thoughts can cause depression and anxiety. This can result in negative lifestyle behavior and serious health problems. Cognitive behavioral therapy is an effective method to help control our thoughts in order to change and improve our emotional outlook.  Additional Videos:  Exercise    Improving Performance  Clinical staff conducted group or individual video education with verbal and written material and guidebook.  Patient learns to use a non-linear approach by alternating intensity levels and lengths of time spent exercising to help burn more calories and lose more body fat. Cardiovascular exercise helps improve heart health, metabolism, hormonal balance, blood sugar control, and recovery from fatigue. Resistance training improves strength, endurance, balance, coordination, reaction time, metabolism, and muscle mass. Flexibility exercise improves circulation, posture, and balance. Seek guidance from your physician and exercise physiologist before implementing an exercise routine and learn your capabilities and proper form for all exercise.  Introduction to Yoga  Clinical staff conducted group or individual video education with verbal and written material and guidebook.  Patient learns about yoga, a discipline of the coming together of mind, breath, and body. The benefits of yoga include improved flexibility, improved range of motion, better posture and core strength, increased lung function, weight loss, and positive self-image. Yoga's heart health benefits include lowered blood pressure, healthier heart rate, decreased cholesterol and triglyceride levels, improved immune function, and reduced stress. Seek guidance from your physician and exercise physiologist before implementing an exercise routine and learn your capabilities and proper form for all exercise.  Medical   Aging: Enhancing Your  Quality of Life  Clinical staff conducted group or individual video education with verbal and written material and guidebook.  Patient learns key strategies and recommendations to stay in good physical health and enhance quality of life, such as prevention strategies, having an advocate, securing a Health Care Proxy and Power of Attorney, and keeping a list of medications and system for tracking them. It also discusses how to avoid risk for bone loss.  Biology of Weight Control  Clinical staff conducted group or individual video education with verbal and written material and guidebook.  Patient learns that weight gain occurs because we consume more calories than we burn (eating more, moving less). Even if your body weight is normal, you may have higher ratios of fat compared to muscle mass. Too much body fat puts you at increased risk for cardiovascular disease, heart attack, stroke, type 2 diabetes, and obesity-related cancers. In addition to exercise, following the Pritikin Eating Plan can help reduce your risk.  Decoding Lab Results  Clinical staff conducted group or individual video education with verbal and written material and guidebook.  Patient learns that lab test reflects one measurement whose values change over time and are influenced by many factors, including medication, stress, sleep, exercise, food, hydration, pre-existing medical conditions, and more. It is recommended to use the knowledge from this video to become more involved with your lab results and evaluate your numbers to speak with your doctor.   Diseases of Our Time - Overview  Clinical staff conducted group or individual video education with verbal and written material and guidebook.  Patient learns that according to the CDC, 50% to 70% of chronic diseases (such as obesity, type 2 diabetes, elevated lipids, hypertension,  and heart disease) are avoidable through lifestyle improvements including healthier food choices,  listening to satiety cues, and increased physical activity.  Sleep Disorders Clinical staff conducted group or individual video education with verbal and written material and guidebook.  Patient learns how good quality and duration of sleep are important to overall health and well-being. Patient also learns about sleep disorders and how they impact health along with recommendations to address them, including discussing with a physician.  Nutrition  Dining Out - Part 2 Clinical staff conducted group or individual video education with verbal and written material and guidebook.  Patient learns how to plan ahead and communicate in order to maximize their dining experience in a healthy and nutritious manner. Included are recommended food choices based on the type of restaurant the patient is visiting.   Fueling a Banker conducted group or individual video education with verbal and written material and guidebook.  There is a strong connection between our food choices and our health. Diseases like obesity and type 2 diabetes are very prevalent and are in large-part due to lifestyle choices. The Pritikin Eating Plan provides plenty of food and hunger-curbing satisfaction. It is easy to follow, affordable, and helps reduce health risks.  Menu Workshop  Clinical staff conducted group or individual video education with verbal and written material and guidebook.  Patient learns that restaurant meals can sabotage health goals because they are often packed with calories, fat, sodium, and sugar. Recommendations include strategies to plan ahead and to communicate with the manager, chef, or server to help order a healthier meal.  Planning Your Eating Strategy  Clinical staff conducted group or individual video education with verbal and written material and guidebook.  Patient learns about the Pritikin Eating Plan and its benefit of reducing the risk of disease. The Pritikin Eating Plan does  not focus on calories. Instead, it emphasizes high-quality, nutrient-rich foods. By knowing the characteristics of the foods, we choose, we can determine their calorie density and make informed decisions.  Targeting Your Nutrition Priorities  Clinical staff conducted group or individual video education with verbal and written material and guidebook.  Patient learns that lifestyle habits have a tremendous impact on disease risk and progression. This video provides eating and physical activity recommendations based on your personal health goals, such as reducing LDL cholesterol, losing weight, preventing or controlling type 2 diabetes, and reducing high blood pressure.  Vitamins and Minerals  Clinical staff conducted group or individual video education with verbal and written material and guidebook.  Patient learns different ways to obtain key vitamins and minerals, including through a recommended healthy diet. It is important to discuss all supplements you take with your doctor.   Healthy Mind-Set    Smoking Cessation  Clinical staff conducted group or individual video education with verbal and written material and guidebook.  Patient learns that cigarette smoking and tobacco addiction pose a serious health risk which affects millions of people. Stopping smoking will significantly reduce the risk of heart disease, lung disease, and many forms of cancer. Recommended strategies for quitting are covered, including working with your doctor to develop a successful plan.  Culinary   Becoming a Set designer conducted group or individual video education with verbal and written material and guidebook.  Patient learns that cooking at home can be healthy, cost-effective, quick, and puts them in control. Keys to cooking healthy recipes will include looking at your recipe, assessing your equipment needs, planning ahead, making  it simple, choosing cost-effective seasonal ingredients, and  limiting the use of added fats, salts, and sugars.  Cooking - Breakfast and Snacks  Clinical staff conducted group or individual video education with verbal and written material and guidebook.  Patient learns how important breakfast is to satiety and nutrition through the entire day. Recommendations include key foods to eat during breakfast to help stabilize blood sugar levels and to prevent overeating at meals later in the day. Planning ahead is also a key component.  Cooking - Educational psychologist conducted group or individual video education with verbal and written material and guidebook.  Patient learns eating strategies to improve overall health, including an approach to cook more at home. Recommendations include thinking of animal protein as a side on your plate rather than center stage and focusing instead on lower calorie dense options like vegetables, fruits, whole grains, and plant-based proteins, such as beans. Making sauces in large quantities to freeze for later and leaving the skin on your vegetables are also recommended to maximize your experience.  Cooking - Healthy Salads and Dressing Clinical staff conducted group or individual video education with verbal and written material and guidebook.  Patient learns that vegetables, fruits, whole grains, and legumes are the foundations of the Pritikin Eating Plan. Recommendations include how to incorporate each of these in flavorful and healthy salads, and how to create homemade salad dressings. Proper handling of ingredients is also covered. Cooking - Soups and [employer] - Soups and Desserts Clinical staff conducted group or individual video education with verbal and written material and guidebook.  Patient learns that Pritikin soups and desserts make for easy, nutritious, and delicious snacks and meal components that are low in sodium, fat, sugar, and calorie density, while high in vitamins, minerals, and filling fiber.  Recommendations include simple and healthy ideas for soups and desserts.   Overview     The Pritikin Solution Program Overview Clinical staff conducted group or individual video education with verbal and written material and guidebook.  Patient learns that the results of the Pritikin Program have been documented in more than 100 articles published in peer-reviewed journals, and the benefits include reducing risk factors for (and, in some cases, even reversing) high cholesterol, high blood pressure, type 2 diabetes, obesity, and more! An overview of the three key pillars of the Pritikin Program will be covered: eating well, doing regular exercise, and having a healthy mind-set.  WORKSHOPS  Exercise: Exercise Basics: Building Your Action Plan Clinical staff led group instruction and group discussion with PowerPoint presentation and patient guidebook. To enhance the learning environment the use of posters, models and videos may be added. At the conclusion of this workshop, patients will comprehend the difference between physical activity and exercise, as well as the benefits of incorporating both, into their routine. Patients will understand the FITT (Frequency, Intensity, Time, and Type) principle and how to use it to build an exercise action plan. In addition, safety concerns and other considerations for exercise and cardiac rehab will be addressed by the presenter. The purpose of this lesson is to promote a comprehensive and effective weekly exercise routine in order to improve patients' overall level of fitness.   Managing Heart Disease: Your Path to a Healthier Heart Clinical staff led group instruction and group discussion with PowerPoint presentation and patient guidebook. To enhance the learning environment the use of posters, models and videos may be added.At the conclusion of this workshop, patients will understand the anatomy  and physiology of the heart. Additionally, they will  understand how Pritikin's three pillars impact the risk factors, the progression, and the management of heart disease.  The purpose of this lesson is to provide a high-level overview of the heart, heart disease, and how the Pritikin lifestyle positively impacts risk factors.  Exercise Biomechanics Clinical staff led group instruction and group discussion with PowerPoint presentation and patient guidebook. To enhance the learning environment the use of posters, models and videos may be added. Patients will learn how the structural parts of their bodies function and how these functions impact their daily activities, movement, and exercise. Patients will learn how to promote a neutral spine, learn how to manage pain, and identify ways to improve their physical movement in order to promote healthy living. The purpose of this lesson is to expose patients to common physical limitations that impact physical activity. Participants will learn practical ways to adapt and manage aches and pains, and to minimize their effect on regular exercise. Patients will learn how to maintain good posture while sitting, walking, and lifting.  Balance Training and Fall Prevention  Clinical staff led group instruction and group discussion with PowerPoint presentation and patient guidebook. To enhance the learning environment the use of posters, models and videos may be added. At the conclusion of this workshop, patients will understand the importance of their sensorimotor skills (vision, proprioception, and the vestibular system) in maintaining their ability to balance as they age. Patients will apply a variety of balancing exercises that are appropriate for their current level of function. Patients will understand the common causes for poor balance, possible solutions to these problems, and ways to modify their physical environment in order to minimize their fall risk. The purpose of this lesson is to teach patients  about the importance of maintaining balance as they age and ways to minimize their risk of falling.  WORKSHOPS   Nutrition:  Fueling a Ship broker led group instruction and group discussion with PowerPoint presentation and patient guidebook. To enhance the learning environment the use of posters, models and videos may be added. Patients will review the foundational principles of the Pritikin Eating Plan and understand what constitutes a serving size in each of the food groups. Patients will also learn Pritikin-friendly foods that are better choices when away from home and review make-ahead meal and snack options. Calorie density will be reviewed and applied to three nutrition priorities: weight maintenance, weight loss, and weight gain. The purpose of this lesson is to reinforce (in a group setting) the key concepts around what patients are recommended to eat and how to apply these guidelines when away from home by planning and selecting Pritikin-friendly options. Patients will understand how calorie density may be adjusted for different weight management goals.  Mindful Eating  Clinical staff led group instruction and group discussion with PowerPoint presentation and patient guidebook. To enhance the learning environment the use of posters, models and videos may be added. Patients will briefly review the concepts of the Pritikin Eating Plan and the importance of low-calorie dense foods. The concept of mindful eating will be introduced as well as the importance of paying attention to internal hunger signals. Triggers for non-hunger eating and techniques for dealing with triggers will be explored. The purpose of this lesson is to provide patients with the opportunity to review the basic principles of the Pritikin Eating Plan, discuss the value of eating mindfully and how to measure internal cues of hunger and fullness using the  Hunger Scale. Patients will also discuss reasons for non-hunger  eating and learn strategies to use for controlling emotional eating.  Targeting Your Nutrition Priorities Clinical staff led group instruction and group discussion with PowerPoint presentation and patient guidebook. To enhance the learning environment the use of posters, models and videos may be added. Patients will learn how to determine their genetic susceptibility to disease by reviewing their family history. Patients will gain insight into the importance of diet as part of an overall healthy lifestyle in mitigating the impact of genetics and other environmental insults. The purpose of this lesson is to provide patients with the opportunity to assess their personal nutrition priorities by looking at their family history, their own health history and current risk factors. Patients will also be able to discuss ways of prioritizing and modifying the Pritikin Eating Plan for their highest risk areas  Menu  Clinical staff led group instruction and group discussion with PowerPoint presentation and patient guidebook. To enhance the learning environment the use of posters, models and videos may be added. Using menus brought in from [employer], or printed from [employer], patients will apply the Pritikin dining out guidelines that were presented in the [employer] video. Patients will also be able to practice these guidelines in a variety of provided scenarios. The purpose of this lesson is to provide patients with the opportunity to practice hands-on learning of the Pritikin Dining Out guidelines with actual menus and practice scenarios.  Label Reading Clinical staff led group instruction and group discussion with PowerPoint presentation and patient guidebook. To enhance the learning environment the use of posters, models and videos may be added. Patients will review and discuss the Pritikin label reading guidelines presented in Pritikin's Label Reading Educational series video.  Using fool labels brought in from local grocery stores and markets, patients will apply the label reading guidelines and determine if the packaged food meet the Pritikin guidelines. The purpose of this lesson is to provide patients with the opportunity to review, discuss, and practice hands-on learning of the Pritikin Label Reading guidelines with actual packaged food labels. Cooking School  Pritikin's [employer] are designed to teach patients ways to prepare quick, simple, and affordable recipes at home. The importance of nutrition's role in chronic disease risk reduction is reflected in its emphasis in the overall Pritikin program. By learning how to prepare essential core Pritikin Eating Plan recipes, patients will increase control over what they eat; be able to customize the flavor of foods without the use of added salt, sugar, or fat; and improve the quality of the food they consume. By learning a set of core recipes which are easily assembled, quickly prepared, and affordable, patients are more likely to prepare more healthy foods at home. These workshops focus on convenient breakfasts, simple entres, side dishes, and desserts which can be prepared with minimal effort and are consistent with nutrition recommendations for cardiovascular risk reduction. Cooking [employer] are taught by a Armed forces logistics/support/administrative officer (RD) who has been trained by the [employer]. The chef or RD has a clear understanding of the importance of minimizing - if not completely eliminating - added fat, sugar, and sodium in recipes. Throughout the series of Cooking School Workshop sessions, patients will learn about healthy ingredients and efficient methods of cooking to build confidence in their capability to prepare    Cooking School weekly topics:  Adding Flavor- Sodium-Free  Fast and Healthy Breakfasts  Powerhouse Plant-Based Proteins  Satisfying Salads and Dressings  Simple Sides and  Sauces  International Cuisine-Spotlight on the [employer] Zones  Delicious Desserts  Savory Soups  [employer] - Meals in a Snap  Tasty Appetizers and Snacks  Comforting Weekend Breakfasts  One-Pot Wonders   Fast Evening Meals  Landscape architect Your Pritikin Plate  WORKSHOPS   Healthy Mindset (Psychosocial):  Focused Goals, Sustainable Changes Clinical staff led group instruction and group discussion with PowerPoint presentation and patient guidebook. To enhance the learning environment the use of posters, models and videos may be added. Patients will be able to apply effective goal setting strategies to establish at least one personal goal, and then take consistent, meaningful action toward that goal. They will learn to identify common barriers to achieving personal goals and develop strategies to overcome them. Patients will also gain an understanding of how our mind-set can impact our ability to achieve goals and the importance of cultivating a positive and growth-oriented mind-set. The purpose of this lesson is to provide patients with a deeper understanding of how to set and achieve personal goals, as well as the tools and strategies needed to overcome common obstacles which may arise along the way.  From Head to Heart: The Power of a Healthy Outlook  Clinical staff led group instruction and group discussion with PowerPoint presentation and patient guidebook. To enhance the learning environment the use of posters, models and videos may be added. Patients will be able to recognize and describe the impact of emotions and mood on physical health. They will discover the importance of self-care and explore self-care practices which may work for them. Patients will also learn how to utilize the 4 C's to cultivate a healthier outlook and better manage stress and challenges. The purpose of this lesson is to demonstrate to patients how a healthy outlook is an essential part of  maintaining good health, especially as they continue their cardiac rehab journey.  Healthy Sleep for a Healthy Heart Clinical staff led group instruction and group discussion with PowerPoint presentation and patient guidebook. To enhance the learning environment the use of posters, models and videos may be added. At the conclusion of this workshop, patients will be able to demonstrate knowledge of the importance of sleep to overall health, well-being, and quality of life. They will understand the symptoms of, and treatments for, common sleep disorders. Patients will also be able to identify daytime and nighttime behaviors which impact sleep, and they will be able to apply these tools to help manage sleep-related challenges. The purpose of this lesson is to provide patients with a general overview of sleep and outline the importance of quality sleep. Patients will learn about a few of the most common sleep disorders. Patients will also be introduced to the concept of "sleep hygiene," and discover ways to self-manage certain sleeping problems through simple daily behavior changes. Finally, the workshop will motivate patients by clarifying the links between quality sleep and their goals of heart-healthy living.   Recognizing and Reducing Stress Clinical staff led group instruction and group discussion with PowerPoint presentation and patient guidebook. To enhance the learning environment the use of posters, models and videos may be added. At the conclusion of this workshop, patients will be able to understand the types of stress reactions, differentiate between acute and chronic stress, and recognize the impact that chronic stress has on their health. They will also be able to apply different coping mechanisms, such as reframing negative self-talk. Patients will have the opportunity  to practice a variety of stress management techniques, such as deep abdominal breathing, progressive muscle relaxation, and/or  guided imagery.  The purpose of this lesson is to educate patients on the role of stress in their lives and to provide healthy techniques for coping with it.  Learning Barriers/Preferences:  Learning Barriers/Preferences - 01/28/24 1542       Learning Barriers/Preferences   Learning Barriers Sight    Learning Preferences Group Instruction;Individual Instruction;Pictoral;Skilled Demonstration;Verbal Instruction             Education Topics:  Knowledge Questionnaire Score:  Knowledge Questionnaire Score - 01/28/24 1538       Knowledge Questionnaire Score   Pre Score 23/24             Core Components/Risk Factors/Patient Goals at Admission:  Personal Goals and Risk Factors at Admission - 01/28/24 1354       Core Components/Risk Factors/Patient Goals on Admission    Weight Management Weight Loss    Tobacco Cessation --   Quit Smoking 40 years ago   Diabetes Yes    Intervention Provide education about signs/symptoms and action to take for hypo/hyperglycemia.;Provide education about proper nutrition, including hydration, and aerobic/resistive exercise prescription along with prescribed medications to achieve blood glucose in normal ranges: Fasting glucose 65-99 mg/dL    Expected Outcomes Short Term: Participant verbalizes understanding of the signs/symptoms and immediate care of hyper/hypoglycemia, proper foot care and importance of medication, aerobic/resistive exercise and nutrition plan for blood glucose control.;Long Term: Attainment of HbA1C < 7%.    Hypertension Yes    Intervention Provide education on lifestyle modifcations including regular physical activity/exercise, weight management, moderate sodium restriction and increased consumption of fresh fruit, vegetables, and low fat dairy, alcohol moderation, and smoking cessation.;Monitor prescription use compliance.    Expected Outcomes Short Term: Continued assessment and intervention until BP is < 140/63mm HG in  hypertensive participants. < 130/5mm HG in hypertensive participants with diabetes, heart failure or chronic kidney disease.;Long Term: Maintenance of blood pressure at goal levels.    Lipids Yes    Intervention Provide education and support for participant on nutrition & aerobic/resistive exercise along with prescribed medications to achieve LDL 70mg , HDL >40mg .    Expected Outcomes Short Term: Participant states understanding of desired cholesterol values and is compliant with medications prescribed. Participant is following exercise prescription and nutrition guidelines.;Long Term: Cholesterol controlled with medications as prescribed, with individualized exercise RX and with personalized nutrition plan. Value goals: LDL < 70mg , HDL > 40 mg.    Stress Yes   self-imposed   Intervention Offer individual and/or small group education and counseling on adjustment to heart disease, stress management and health-related lifestyle change. Teach and support self-help strategies.;Refer participants experiencing significant psychosocial distress to appropriate mental health specialists for further evaluation and treatment. When possible, include family members and significant others in education/counseling sessions.    Expected Outcomes Short Term: Participant demonstrates changes in health-related behavior, relaxation and other stress management skills, ability to obtain effective social support, and compliance with psychotropic medications if prescribed.;Long Term: Emotional wellbeing is indicated by absence of clinically significant psychosocial distress or social isolation.             Core Components/Risk Factors/Patient Goals Review:   Goals and Risk Factor Review     Row Name 02/03/24 1351 02/23/24 0956           Core Components/Risk Factors/Patient Goals Review   Personal Goals Review Weight Management/Obesity;Lipids;Hypertension;Diabetes Weight Management/Obesity;Lipids;Hypertension;Diabetes  Review Nathan Duncan started cardiac rehab on 02/03/24. Nathan Duncan did well with exercise. Vital signs were stable. CBG's were elevated as Nathan Duncan did not take his insulin as he was running late. Nathan Duncan says he will be back on track on Wed. Nathan Duncan is doing  well exercise at cardiac rehab.  Vital signs have been stable. CBG's have been variable.      Expected Outcomes Geofrey will continue to participate in cardiac rehab for exercise, nutrition and lifestyle modifications Xeng will continue to participate in cardiac rehab for exercise, nutrition and lifestyle modifications               Core Components/Risk Factors/Patient Goals at Discharge (Final Review):   Goals and Risk Factor Review - 02/23/24 0956       Core Components/Risk Factors/Patient Goals Review   Personal Goals Review Weight Management/Obesity;Lipids;Hypertension;Diabetes    Review Nathan Duncan is doing  well exercise at cardiac rehab.  Vital signs have been stable. CBG's have been variable.    Expected Outcomes Nathan Duncan will continue to participate in cardiac rehab for exercise, nutrition and lifestyle modifications             ITP Comments:  ITP Comments     Row Name 01/28/24 1347 02/23/24 0954         ITP Comments Dr. Armanda Magic medical director. Introduction to [facility] education/intensive cardiac rehab. Initial orientation packet reviewed with patient. 30 Day ITP Review. Irvin has good attendance and participation in cardiac rehab.               Comments: See ITP Comments

## 2024-02-25 ENCOUNTER — Encounter (HOSPITAL_COMMUNITY): Payer: Medicare Other

## 2024-02-27 ENCOUNTER — Encounter (HOSPITAL_COMMUNITY): Payer: Medicare Other

## 2024-03-01 ENCOUNTER — Encounter (HOSPITAL_COMMUNITY): Admission: RE | Admit: 2024-03-01 | Payer: Medicare Other | Source: Ambulatory Visit

## 2024-03-01 ENCOUNTER — Telehealth (HOSPITAL_COMMUNITY): Payer: Self-pay | Admitting: *Deleted

## 2024-03-01 NOTE — Telephone Encounter (Signed)
 Left message to call cardiac rehab.Thayer Headings RN BSN

## 2024-03-03 ENCOUNTER — Telehealth (HOSPITAL_COMMUNITY): Payer: Self-pay

## 2024-03-03 ENCOUNTER — Encounter (HOSPITAL_COMMUNITY)
Admission: RE | Admit: 2024-03-03 | Discharge: 2024-03-03 | Disposition: A | Discharge status: Home / Self Care | Payer: Medicare Other | Source: Ambulatory Visit | Attending: Cardiology | Admitting: Cardiology

## 2024-03-03 DIAGNOSIS — Z48812 Encounter for surgical aftercare following surgery on the circulatory system: Secondary | ICD-10-CM | POA: Insufficient documentation

## 2024-03-03 DIAGNOSIS — Z955 Presence of coronary angioplasty implant and graft: Secondary | ICD-10-CM | POA: Insufficient documentation

## 2024-03-03 NOTE — Telephone Encounter (Signed)
 Outgoing phone call made to pt regarding absence from [facility]. Pt reported that he has been sick since Sunday, he tested negative for COVID and will take flu test. Pt hopes to return Monday if symptoms improve.  Faustino Congress MS, ACSM-CEP 03/03/2024 3:15 PM

## 2024-03-05 ENCOUNTER — Encounter (HOSPITAL_COMMUNITY): Payer: Medicare Other

## 2024-03-08 ENCOUNTER — Encounter (HOSPITAL_COMMUNITY): Admission: RE | Admit: 2024-03-08 | Payer: Medicare Other | Source: Ambulatory Visit

## 2024-03-08 ENCOUNTER — Telehealth (HOSPITAL_COMMUNITY): Payer: Self-pay | Admitting: *Deleted

## 2024-03-08 NOTE — Telephone Encounter (Signed)
 [name] called out for this week with lingering cold symptoms that he can not seem to shake.  Does not want to come to Cardiac rehab until he feels better.  Asked about making up sessions.  Advised once he returns we can look at it.  We do have hard stop dates that we can not go beyond.  Verbalized understanding. Alanson Aly, BSN Cardiac and Emergency planning/management officer

## 2024-03-10 ENCOUNTER — Encounter (HOSPITAL_COMMUNITY): Payer: Medicare Other

## 2024-03-10 ENCOUNTER — Other Ambulatory Visit: Payer: Self-pay

## 2024-03-10 DIAGNOSIS — E1169 Type 2 diabetes mellitus with other specified complication: Secondary | ICD-10-CM

## 2024-03-10 MED ORDER — TRESIBA FLEXTOUCH 100 UNIT/ML ~~LOC~~ SOPN: PEN_INJECTOR | SUBCUTANEOUS | 0 refills | Status: DC

## 2024-03-12 ENCOUNTER — Encounter (HOSPITAL_COMMUNITY): Payer: Medicare Other

## 2024-03-15 ENCOUNTER — Encounter (HOSPITAL_COMMUNITY)
Admission: RE | Admit: 2024-03-15 | Discharge: 2024-03-15 | Disposition: A | Discharge status: Home / Self Care | Payer: Medicare Other | Source: Ambulatory Visit | Attending: Cardiology

## 2024-03-15 DIAGNOSIS — Z48812 Encounter for surgical aftercare following surgery on the circulatory system: Secondary | ICD-10-CM | POA: Diagnosis not present

## 2024-03-15 DIAGNOSIS — Z955 Presence of coronary angioplasty implant and graft: Secondary | ICD-10-CM

## 2024-03-16 ENCOUNTER — Other Ambulatory Visit: Payer: Self-pay | Admitting: Endocrinology

## 2024-03-16 DIAGNOSIS — E1169 Type 2 diabetes mellitus with other specified complication: Secondary | ICD-10-CM

## 2024-03-17 ENCOUNTER — Encounter (HOSPITAL_COMMUNITY)
Admission: RE | Admit: 2024-03-17 | Discharge: 2024-03-17 | Disposition: A | Discharge status: Home / Self Care | Payer: Medicare Other | Source: Ambulatory Visit | Attending: Cardiology

## 2024-03-17 DIAGNOSIS — Z48812 Encounter for surgical aftercare following surgery on the circulatory system: Secondary | ICD-10-CM | POA: Diagnosis not present

## 2024-03-17 DIAGNOSIS — Z955 Presence of coronary angioplasty implant and graft: Secondary | ICD-10-CM | POA: Diagnosis not present

## 2024-03-17 NOTE — Progress Notes (Signed)
 Reviewed home exercise guidelines with Tarris including endpoints, temperature precautions, target heart rate and rate of perceived exertion. He is currently walking 3/4 of a mile in 12-15 minutes, 2 days/week as his mode of home exercise. Hashem voices understanding of instructions given.  Artist Pais, MS, ACSM CEP

## 2024-03-19 ENCOUNTER — Encounter (HOSPITAL_COMMUNITY)
Admission: RE | Admit: 2024-03-19 | Discharge: 2024-03-19 | Disposition: A | Discharge status: Home / Self Care | Payer: Medicare Other | Source: Ambulatory Visit | Attending: Cardiology | Admitting: Cardiology

## 2024-03-19 DIAGNOSIS — Z48812 Encounter for surgical aftercare following surgery on the circulatory system: Secondary | ICD-10-CM | POA: Diagnosis not present

## 2024-03-19 DIAGNOSIS — Z955 Presence of coronary angioplasty implant and graft: Secondary | ICD-10-CM

## 2024-03-22 ENCOUNTER — Encounter (HOSPITAL_COMMUNITY)
Admission: RE | Admit: 2024-03-22 | Discharge: 2024-03-22 | Disposition: A | Discharge status: Home / Self Care | Payer: Medicare Other | Source: Ambulatory Visit | Attending: Cardiology | Admitting: Cardiology

## 2024-03-22 DIAGNOSIS — Z955 Presence of coronary angioplasty implant and graft: Secondary | ICD-10-CM | POA: Diagnosis not present

## 2024-03-22 DIAGNOSIS — Z48812 Encounter for surgical aftercare following surgery on the circulatory system: Secondary | ICD-10-CM | POA: Diagnosis not present

## 2024-03-22 NOTE — Progress Notes (Signed)
 Cardiac Individual Treatment Plan  Patient Details  Name: Nathan Duncan MRN: [id number] Date of Birth: 07-12-1951 Referring Provider:   Flowsheet Row INTENSIVE CARDIAC REHAB ORIENT from 01/28/2024 in [facility]  Referring Provider Donato Schultz       Initial Encounter Date:  Flowsheet Row INTENSIVE CARDIAC REHAB ORIENT from 01/28/2024 in [facility]  Date 01/28/24       Visit Diagnosis: 12/02/23 S/P coronary artery stent placement  Patient's Home Medications on Admission:  Current Outpatient Medications:    aspirin EC 81 MG tablet, Take 81 mg by mouth at bedtime., Disp: , Rfl:    atorvastatin (LIPITOR) 40 MG tablet, Take 40 mg by mouth daily. , Disp: , Rfl:    fluticasone (FLONASE) 50 MCG/ACT nasal spray, Place 1 spray into both nostrils daily., Disp: , Rfl:    gabapentin (NEURONTIN) 100 MG capsule, Take 1 capsule (100 mg total) by mouth at bedtime., Disp: 90 capsule, Rfl: 3   glimepiride (AMARYL) 2 MG tablet, Take 1 tablet (2 mg total) by mouth daily with breakfast., Disp: 90 tablet, Rfl: 4   icosapent Ethyl (VASCEPA) 1 g capsule, Take 2 capsules (2 g total) by mouth 2 (two) times daily., Disp: 120 capsule, Rfl: 11   insulin aspart (NOVOLOG FLEXPEN) 100 UNIT/ML FlexPen, Inject 4-5 units into skin 3 times daily before meals., Disp: 15 mL, Rfl: 4   insulin degludec (TRESIBA FLEXTOUCH) 100 UNIT/ML FlexTouch Pen, Inject once daily, adjust as directed with max dose 25 units, Disp: 6 mL, Rfl: 0   Insulin Pen Needle (B-D UF III MINI PEN NEEDLES) 31G X 5 MM MISC, USE WITH INSULIN PEN/uses 3 to 4 a day, Disp: 100 each, Rfl: 5   JARDIANCE 25 MG TABS tablet, Take 1 tablet by mouth once daily, Disp: 90 tablet, Rfl: 0   lisinopril (PRINIVIL,ZESTRIL) 5 MG tablet, Take 5 mg by mouth daily., Disp: , Rfl:    metFORMIN (GLUCOPHAGE-XR) 500 MG 24 hr tablet, TAKE 2 TABLETS BY MOUTH  TWICE DAILY WITH A MEAL, Disp: 360 tablet, Rfl: 3   nitroGLYCERIN (NITROSTAT) 0.4 MG SL tablet, Place 1 tablet (0.4 mg total) under the tongue every 5 (five) minutes as needed., Disp: 25 tablet, Rfl: 2   ticagrelor (BRILINTA) 90 MG TABS tablet, Take 1 tablet (90 mg total) by mouth 2 (two) times daily., Disp: 180 tablet, Rfl: 3  Past Medical History: Past Medical History:  Diagnosis Date   Diabetes (HCC)    Dyslipidemia     Tobacco Use: Social History   Tobacco Use  Smoking Status Former  Smokeless Tobacco Never    Labs: Review Flowsheet  More data exists      Latest Ref Rng & Units 05/07/2023 07/15/2023 09/29/2023 12/02/2023 12/25/2023  Labs for ITP Cardiac and Pulmonary Rehab  Cholestrol 0 - 200 mg/dL - - - 409  -  LDL (calc) 0 - 99 mg/dL - - - 65  -  HDL-C >81 mg/dL - - - 31  -  Trlycerides <150 mg/dL - - - 191  -  Hemoglobin A1c <5.7 % of total Hgb 8.0  7.9  7.5  - 7.9     Capillary Blood Glucose: Lab Results  Component Value Date   GLUCAP 256 (H) 02/02/2024   GLUCAP 287 (H) 02/02/2024   GLUCAP 124 (H) 12/02/2023   GLUCAP 140 (H) 12/02/2023   GLUCAP 136 (H) 11/25/2023  Exercise Target Goals: Exercise Program Goal: Individual exercise prescription set using results from initial 6 min walk test and THRR while considering  patient's activity barriers and safety.   Exercise Prescription Goal: Initial exercise prescription builds to 30-45 minutes a day of aerobic activity, 2-3 days per week.  Home exercise guidelines will be given to patient during program as part of exercise prescription that the participant will acknowledge.  Activity Barriers & Risk Stratification:  Activity Barriers & Cardiac Risk Stratification - 01/28/24 1553       Activity Barriers & Cardiac Risk Stratification   Activity Barriers None    Cardiac Risk Stratification High             6 Minute Walk:  6 Minute Walk     Row Name 01/28/24 1543         6 Minute Walk   Phase Initial      Distance 1080 feet     Walk Time 6 minutes     # of Rest Breaks 0     MPH 2.04     METS 2.4     RPE 11     Perceived Dyspnea  0     VO2 Peak 8.42     Symptoms No     Resting HR 65 bpm     Resting BP 138/54     Resting Oxygen Saturation  96 %     Max Ex. HR 102 bpm     Max Ex. BP 154/50     2 Minute Post BP 130/50              Oxygen Initial Assessment:   Oxygen Re-Evaluation:   Oxygen Discharge (Final Oxygen Re-Evaluation):   Initial Exercise Prescription:  Initial Exercise Prescription - 01/28/24 1500       Date of Initial Exercise RX and Referring Provider   Date 01/28/24    Referring Provider Donato Schultz    Expected Discharge Date 05/21/24      Treadmill   MPH 2    Grade 0    Minutes 15    METs 2.5      Bike   Level 1.5    Watts 15    Minutes 15    METs 2.6      Prescription Details   Frequency (times per week) 3days/week    Duration Progress to 30 minutes of continuous aerobic without signs/symptoms of physical distress      Intensity   THRR 40-80% of Max Heartrate 58-117    Ratings of Perceived Exertion 11-13    Perceived Dyspnea 0-4      Progression   Progression Continue progressive overload as per policy without signs/symptoms or physical distress.      Resistance Training   Training Prescription Yes    Weight 4lbs    Reps 10-15             Perform Capillary Blood Glucose checks as needed.  Exercise Prescription Changes:   Exercise Prescription Changes     Row Name 02/02/24 1027 02/09/24 1031 02/20/24 1027 03/15/24 1200 03/17/24 1024     Response to Exercise   Blood Pressure (Admit) 118/64 122/60 130/56 120/56 110/58   Blood Pressure (Exercise) 152/68 148/62 148/62 -- 130/80   Blood Pressure (Exit) 122/60 104/60 126/71 102/58 94/60   Heart Rate (Admit) 82 bpm 73 bpm 58 bpm 81 bpm 78 bpm   Heart Rate (Exercise) 118 bpm 126 bpm 116 bpm 131 bpm 123 bpm  Heart Rate (Exit) 91 bpm 96 bpm 85 bpm 96 bpm 87 bpm   Rating  of Perceived Exertion (Exercise) 11 11 10 9 10    Symptoms None None None None None   Comments Off to a good start with exercise. Increased workload on bike. -- Reviewed Goals Reviewed home exercise guidelines with Ree Kida.   Duration Continue with 30 min of aerobic exercise without signs/symptoms of physical distress. Continue with 30 min of aerobic exercise without signs/symptoms of physical distress. Continue with 30 min of aerobic exercise without signs/symptoms of physical distress. Continue with 30 min of aerobic exercise without signs/symptoms of physical distress. Continue with 30 min of aerobic exercise without signs/symptoms of physical distress.   Intensity THRR unchanged THRR unchanged THRR unchanged THRR unchanged THRR unchanged     Progression   Progression Continue to progress workloads to maintain intensity without signs/symptoms of physical distress. Continue to progress workloads to maintain intensity without signs/symptoms of physical distress. Continue to progress workloads to maintain intensity without signs/symptoms of physical distress. Continue to progress workloads to maintain intensity without signs/symptoms of physical distress. Continue to progress workloads to maintain intensity without signs/symptoms of physical distress.   Average METs 2.7 4 3.9 4.4 4.2     Resistance Training   Training Prescription Yes Yes Yes Yes No   Weight 4lbs 4lbs 4lbs 4lbs Relaxation day, no weights.   Reps 10-15 10-15 10-15 10-15 --   Time 10 Minutes 10 Minutes 10 Minutes 10 Minutes --     Interval Training   Interval Training No No No -- --     Treadmill   MPH 2.5 2.9 3.3 3.3 3.3   Grade 0 1 1.5 1.5 1.5   Minutes 15 15 15 15 15    METs 2.91 3.62 4.21 4.21 4.21     Bike   Level 1.5 2.5 3.5 3.5 3.5   Watts 18 43 26 36 28   Minutes 15 15 15 15 15    METs 2.5 4.4 3.7 4.5 4.2     Home Exercise Plan   Plans to continue exercise at -- -- -- -- Home (comment)  Walking   Frequency -- -- --  -- Add 2 additional days to program exercise sessions.   Initial Home Exercises Provided -- -- -- -- 03/17/24    Row Name 03/22/24 1016             Response to Exercise   Blood Pressure (Admit) 120/52       Blood Pressure (Exit) 110/58       Heart Rate (Admit) 70 bpm       Heart Rate (Exercise) 112 bpm       Heart Rate (Exit) 79 bpm       Rating of Perceived Exertion (Exercise) 11       Symptoms None       Duration Continue with 30 min of aerobic exercise without signs/symptoms of physical distress.       Intensity THRR unchanged         Progression   Progression Continue to progress workloads to maintain intensity without signs/symptoms of physical distress.       Average METs 3.9         Resistance Training   Training Prescription Yes       Weight 6 lb wts       Reps 10-15       Time 10 Minutes         Interval Training  Interval Training No         Treadmill   MPH 3.1       Grade 1.5       Minutes 15       METs 4.01         Recumbant Bike   Level 4       RPM 54       Watts 52       Minutes 15       METs 3.8         Home Exercise Plan   Plans to continue exercise at Home (comment)  Walking       Frequency Add 2 additional days to program exercise sessions.       Initial Home Exercises Provided 03/17/24                Exercise Comments:   Exercise Comments     Row Name 01/28/24 1547 02/02/24 1132 03/15/24 1211 03/17/24 1052     Exercise Comments Orientation, Pt performed w/o unusual s/s Ree Kida tolerated first session of exercise well without symptoms. Oriented him to the exercise and stretching routine. Reviewed goals with patient. Prior to missing three weeks of CR d/t being out sick Trevone reported that his self-confidence and fitness was improving. Isak also started walking on Tuesday's and Thursday to continue his exercise. Unfortuantely Even became sick missed some time but he is looking forward to slowly progressing and getting back into  routine. Will continue to watch. Reviewed home exercise guidelines with Breion.             Exercise Goals and Review:   Exercise Goals     Row Name 01/28/24 1548             Exercise Goals   Increase Physical Activity Yes       Intervention Provide advice, education, support and counseling about physical activity/exercise needs.;Develop an individualized exercise prescription for aerobic and resistive training based on initial evaluation findings, risk stratification, comorbidities and participant's personal goals.       Expected Outcomes Short Term: Attend rehab on a regular basis to increase amount of physical activity.;Long Term: Exercising regularly at least 3-5 days a week.;Long Term: Add in home exercise to make exercise part of routine and to increase amount of physical activity.       Increase Strength and Stamina Yes       Intervention Provide advice, education, support and counseling about physical activity/exercise needs.;Develop an individualized exercise prescription for aerobic and resistive training based on initial evaluation findings, risk stratification, comorbidities and participant's personal goals.       Expected Outcomes Short Term: Increase workloads from initial exercise prescription for resistance, speed, and METs.;Long Term: Improve cardiorespiratory fitness, muscular endurance and strength as measured by increased METs and functional capacity ( );Short Term: Perform resistance training exercises routinely during rehab and add in resistance training at home       Able to understand and use rate of perceived exertion (RPE) scale Yes       Intervention Provide education and explanation on how to use RPE scale       Expected Outcomes Short Term: Able to use RPE daily in rehab to express subjective intensity level;Long Term:  Able to use RPE to guide intensity level when exercising independently       Knowledge and understanding of Target Heart Rate Range (THRR)  Yes       Intervention Provide education and  explanation of THRR including how the numbers were predicted and where they are located for reference       Expected Outcomes Short Term: Able to state/look up THRR;Long Term: Able to use THRR to govern intensity when exercising independently;Short Term: Able to use daily as guideline for intensity in rehab       Understanding of Exercise Prescription Yes       Intervention Provide education, explanation, and written materials on patient's individual exercise prescription       Expected Outcomes Short Term: Able to explain program exercise prescription;Long Term: Able to explain home exercise prescription to exercise independently                Exercise Goals Re-Evaluation :  Exercise Goals Re-Evaluation     Row Name 02/02/24 1132 03/15/24 1221 03/17/24 1052         Exercise Goal Re-Evaluation   Exercise Goals Review Increase Physical Activity;Increase Strength and Stamina;Able to understand and use rate of perceived exertion (RPE) scale Increase Physical Activity;Increase Strength and Stamina;Able to understand and use rate of perceived exertion (RPE) scale;Knowledge and understanding of Target Heart Rate Range (THRR);Understanding of Exercise Prescription Increase Physical Activity;Increase Strength and Stamina;Able to understand and use rate of perceived exertion (RPE) scale;Knowledge and understanding of Target Heart Rate Range (THRR);Understanding of Exercise Prescription     Comments Kyng was able to understand and use RPE scale appropriately. Reviewed goals with patient. Prior to missing three weeks of CR d/t being out sick Rigdon reported that his self-confidence and fitness was improving. Anthonyjames also started walking on Tuesday's and Thursday to continue his exercise. Unfortuantely Rafan became sick missed some time but he is looking forward to slowly progressing and getting back into routine. Will continue to watch. Reviewed exercise  prescription with Ree Kida.     Expected Outcomes Progress workloads as tolerated to help increase strength and stamina. Ansel will get back into his exercise routine. Burleigh will walk at least 12-15 minutes 2 days/week in addition to exercise at cardiac rehab.              Discharge Exercise Prescription (Final Exercise Prescription Changes):  Exercise Prescription Changes - 03/22/24 1016       Response to Exercise   Blood Pressure (Admit) 120/52    Blood Pressure (Exit) 110/58    Heart Rate (Admit) 70 bpm    Heart Rate (Exercise) 112 bpm    Heart Rate (Exit) 79 bpm    Rating of Perceived Exertion (Exercise) 11    Symptoms None    Duration Continue with 30 min of aerobic exercise without signs/symptoms of physical distress.    Intensity THRR unchanged      Progression   Progression Continue to progress workloads to maintain intensity without signs/symptoms of physical distress.    Average METs 3.9      Resistance Training   Training Prescription Yes    Weight 6 lb wts    Reps 10-15    Time 10 Minutes      Interval Training   Interval Training No      Treadmill   MPH 3.1    Grade 1.5    Minutes 15    METs 4.01      Recumbant Bike   Level 4    RPM 54    Watts 52    Minutes 15    METs 3.8      Home Exercise Plan   Plans to continue exercise at Home (  comment)   Walking   Frequency Add 2 additional days to program exercise sessions.    Initial Home Exercises Provided 03/17/24             Nutrition:  Target Goals: Understanding of nutrition guidelines, daily intake of sodium 1500mg , cholesterol 200mg , calories 30% from fat and 7% or less from saturated fats, daily to have 5 or more servings of fruits and vegetables.  Biometrics:  Pre Biometrics - 01/28/24 1547       Pre Biometrics   Height 5\' 11"  (1.803 m)    Weight 88 kg    Waist Circumference 44 inches    Hip Circumference 42 inches    Waist to Hip Ratio 1.05 %    BMI (Calculated) 27.07    Triceps  Skinfold 14 mm    % Body Fat 28.9 %    Grip Strength 42 kg    Flexibility 14 in    Single Leg Stand 7.97 seconds              Nutrition Therapy Plan and Nutrition Goals:  Nutrition Therapy & Goals - 03/19/24 1022       Nutrition Therapy   Diet Heart healthy/carbohydrate consistent diet    Drug/Food Interactions Statins/Certain Fruits      Personal Nutrition Goals   Nutrition Goal Patient to identify strategies for reducing cardiovascular risk by attending the [facility] education and nutrition series weekly.   goal not met.   Personal Goal #2 Patient to improve diet quality by using the plate method as a guide for meal planning to include lean protein/plant protein, fruits, vegetables, whole grains, nonfat dairy as part of a well-balanced diet.   goal in progress.   Comments Goals not met. Tripton has medical history of s/p coronary artery stent placement, HTN, hyperlipidemia, DM2, OSA. Triglycerides remain elevated (vascepa). LDL is at goal. A1c remains elevated above goal. Quandarius has not regularly attended the [facility] education and nutrition series over the last 30 days. He is up 2.2# since starting with our program. Patient will continue to benefit from participation in intensive cardiac rehab for nutrition, exercise, and lifestyle modification.      Intervention Plan   Intervention Prescribe, educate and counsel regarding individualized specific dietary modifications aiming towards targeted core components such as weight, hypertension, lipid management, diabetes, heart failure and other comorbidities.;Nutrition handout(s) given to patient.    Expected Outcomes Long Term Goal: Adherence to prescribed nutrition plan.;Short Term Goal: Understand basic principles of dietary content, such as calories, fat, sodium, cholesterol and nutrients.             Nutrition Assessments:  MEDIFICTS Score Key: >=70 Need to make dietary changes  40-70 Heart Healthy Diet <= 40 Therapeutic Level  Cholesterol Diet    Picture Your Plate Scores: <16 Unhealthy dietary pattern with much room for improvement. 41-50 Dietary pattern unlikely to meet recommendations for good health and room for improvement. 51-60 More healthful dietary pattern, with some room for improvement.  >60 Healthy dietary pattern, although there may be some specific behaviors that could be improved.    Nutrition Goals Re-Evaluation:  Nutrition Goals Re-Evaluation     Row Name 02/19/24 1028 03/19/24 1022           Goals   Current Weight 194 lb 14.2 oz (88.4 kg) 191 lb 2.2 oz (86.7 kg)      Comment A1c 7.9, triglycerides 238, HDL 38, LDL 65 no new labs; most recent labs A1c 7.9, triglycerides 238,  HDL 38, LDL 65      Expected Outcome Goals in progress. Hutson has medical history of s/p coronary artery stent placement, HTN, hyperlipidemia, DM2, OSA. Triglycerides remain elevated (vascepa). LDL is at goal. A1c is not at goal. Antuan does continue to attend the [facility] education and nutrition series regularly. He is up 6# since starting with our program. Patient will benefit from participation in intensive cardiac rehab for nutrition, exercise, and lifestyle modification. Goals not met. Ladarious has medical history of s/p coronary artery stent placement, HTN, hyperlipidemia, DM2, OSA. Triglycerides remain elevated (vascepa). LDL is at goal. A1c remains elevated above goal. Richrd has not regularly attended the [facility] education and nutrition series over the last 30 days. He is up 2.2# since starting with our program. Patient will continue to benefit from participation in intensive cardiac rehab for nutrition, exercise, and lifestyle modification.               Nutrition Goals Re-Evaluation:  Nutrition Goals Re-Evaluation     Row Name 02/19/24 1028 03/19/24 1022           Goals   Current Weight 194 lb 14.2 oz (88.4 kg) 191 lb 2.2 oz (86.7 kg)      Comment A1c 7.9, triglycerides 238, HDL 38, LDL 65 no new labs; most  recent labs A1c 7.9, triglycerides 238, HDL 38, LDL 65      Expected Outcome Goals in progress. Benigno has medical history of s/p coronary artery stent placement, HTN, hyperlipidemia, DM2, OSA. Triglycerides remain elevated (vascepa). LDL is at goal. A1c is not at goal. Mckenzie does continue to attend the [facility] education and nutrition series regularly. He is up 6# since starting with our program. Patient will benefit from participation in intensive cardiac rehab for nutrition, exercise, and lifestyle modification. Goals not met. Hubbert has medical history of s/p coronary artery stent placement, HTN, hyperlipidemia, DM2, OSA. Triglycerides remain elevated (vascepa). LDL is at goal. A1c remains elevated above goal. Takeshi has not regularly attended the [facility] education and nutrition series over the last 30 days. He is up 2.2# since starting with our program. Patient will continue to benefit from participation in intensive cardiac rehab for nutrition, exercise, and lifestyle modification.               Nutrition Goals Discharge (Final Nutrition Goals Re-Evaluation):  Nutrition Goals Re-Evaluation - 03/19/24 1022       Goals   Current Weight 191 lb 2.2 oz (86.7 kg)    Comment no new labs; most recent labs A1c 7.9, triglycerides 238, HDL 38, LDL 65    Expected Outcome Goals not met. Ashaz has medical history of s/p coronary artery stent placement, HTN, hyperlipidemia, DM2, OSA. Triglycerides remain elevated (vascepa). LDL is at goal. A1c remains elevated above goal. Thaine has not regularly attended the [facility] education and nutrition series over the last 30 days. He is up 2.2# since starting with our program. Patient will continue to benefit from participation in intensive cardiac rehab for nutrition, exercise, and lifestyle modification.             Psychosocial: Target Goals: Acknowledge presence or absence of significant depression and/or stress, maximize coping skills, provide positive support  system. Participant is able to verbalize types and ability to use techniques and skills needed for reducing stress and depression.  Initial Review & Psychosocial Screening:   Quality of Life Scores:  Quality of Life - 01/28/24 1542       Quality of Life  Select Quality of Life      Quality of Life Scores   Health/Function Pre 24.3 %    Socioeconomic Pre 25.25 %    Psych/Spiritual Pre 23.71 %    Family Pre 30 %    GLOBAL Pre 25.21 %            Scores of 19 and below usually indicate a poorer quality of life in these areas.  A difference of  2-3 points is a clinically meaningful difference.  A difference of 2-3 points in the total score of the Quality of Life Index has been associated with significant improvement in overall quality of life, self-image, physical symptoms, and general health in studies assessing change in quality of life.  PHQ-9: Review Flowsheet       02/02/2024 01/28/2024 05/13/2022 11/07/2016  Depression screen PHQ 2/9  Decreased Interest 0 3 0 0 0  Down, Depressed, Hopeless 0 1 0 0 1  PHQ - 2 Score 0 4 0 0 1  Altered sleeping 0 1 - -  Tired, decreased energy 1 1 - -  Change in appetite 0 0 - -  Feeling bad or failure about yourself  0 0 - -  Trouble concentrating 0 0 - -  Moving slowly or fidgety/restless 0 1 - -  Suicidal thoughts 0 0 - -  PHQ-9 Score 1 7 - -  Difficult doing work/chores Not difficult at all Not difficult at all - -    Details       Multiple values from one day are sorted in reverse-chronological order        Interpretation of Total Score  Total Score Depression Severity:  1-4 = Minimal depression, 5-9 = Mild depression, 10-14 = Moderate depression, 15-19 = Moderately severe depression, 20-27 = Severe depression   Psychosocial Evaluation and Intervention:   Psychosocial Re-Evaluation:  Psychosocial Re-Evaluation     Row Name 02/03/24 1347 02/23/24 0956 03/23/24 0802         Psychosocial Re-Evaluation   Current issues  with None Identified None Identified None Identified     Comments -- -- Hayven was absent due to cold symptoms and has returned without concerns     Interventions Encouraged to attend Cardiac Rehabilitation for the exercise Encouraged to attend Cardiac Rehabilitation for the exercise Encouraged to attend Cardiac Rehabilitation for the exercise     Continue Psychosocial Services  No Follow up required No Follow up required No Follow up required              Psychosocial Discharge (Final Psychosocial Re-Evaluation):  Psychosocial Re-Evaluation - 03/23/24 0802       Psychosocial Re-Evaluation   Current issues with None Identified    Comments Bevin was absent due to cold symptoms and has returned without concerns    Interventions Encouraged to attend Cardiac Rehabilitation for the exercise    Continue Psychosocial Services  No Follow up required             Vocational Rehabilitation: Provide vocational rehab assistance to qualifying candidates.   Vocational Rehab Evaluation & Intervention:  Vocational Rehab - 01/28/24 1542       Initial Vocational Rehab Evaluation & Intervention   Assessment shows need for Vocational Rehabilitation No             Education: Education Goals: Education classes will be provided on a weekly basis, covering required topics. Participant will state understanding/return demonstration of topics presented.    Education  Row Name 02/02/24 1100     Education   Cardiac Education Topics Pritikin   [employer] Workshops     Workshops   Educator Exercise Physiologist   Select Exercise   Exercise Workshop Location manager and Fall Prevention   Instruction Review Code 1- Verbalizes Understanding   Class Start Time 1142   Class Stop Time 1224   Class Time Calculation (min) 42 min    Row Name 02/04/24 1000     Education   Cardiac Education Topics Pritikin   Set designer Exercise Physiologist   Weekly  Topic Adding Flavor - Sodium-Free   Instruction Review Code 1- Verbalizes Understanding   Class Start Time 1145   Class Stop Time 1225   Class Time Calculation (min) 40 min    Row Name 02/06/24 1500     Education   Cardiac Education Topics Pritikin   Licensed conveyancer Nutrition   Nutrition Overview of the Pritikin Eating Plan   Instruction Review Code 1- Verbalizes Understanding   Class Start Time 1145   Class Stop Time 1238   Class Time Calculation (min) 53 min    Row Name 02/09/24 1100     Education   Cardiac Education Topics Pritikin   Nurse, children's Exercise Physiologist   Select Psychosocial   Psychosocial Healthy Minds, Bodies, Hearts   Instruction Review Code 1- Verbalizes Understanding   Class Start Time 1155   Class Stop Time 1240   Class Time Calculation (min) 45 min    Row Name 02/11/24 1200     Education   Cardiac Education Topics Pritikin   Dentist   Weekly Topic Fast and Healthy Breakfasts   Instruction Review Code 1- Verbalizes Understanding   Class Start Time 1143   Class Stop Time 1215   Class Time Calculation (min) 32 min    Row Name 03/15/24 1100     Education   Cardiac Education Topics Pritikin   [employer]     Workshops   Educator --   Physiological scientist --   Psychosocial Workshop --   Instruction Review Code --   Class Start Time --   Class Stop Time --   Class Time Calculation (min) --    Row Name 03/19/24 1100     Education   Cardiac Education Topics Pritikin   Psychologist, counselling   Nutrition Dining Out - Part 1   Instruction Review Code 1- Verbalizes Understanding   Class Start Time 1145   Class Stop Time 1222   Class Time Calculation (min) 37 min            Core Videos: Exercise    Move It!  Clinical staff conducted group or individual video  education with verbal and written material and guidebook.  Patient learns the recommended Pritikin exercise program. Exercise with the goal of living a long, healthy life. Some of the health benefits of exercise include controlled diabetes, healthier blood pressure levels, improved cholesterol levels, improved heart and lung capacity, improved sleep, and better body composition. Everyone should speak with their doctor before starting or changing an exercise routine.  Biomechanical Limitations Clinical staff conducted group or individual video education with verbal and  written material and guidebook.  Patient learns how biomechanical limitations can impact exercise and how we can mitigate and possibly overcome limitations to have an impactful and balanced exercise routine.  Body Composition Clinical staff conducted group or individual video education with verbal and written material and guidebook.  Patient learns that body composition (ratio of muscle mass to fat mass) is a key component to assessing overall fitness, rather than body weight alone. Increased fat mass, especially visceral belly fat, can put [place] at increased risk for metabolic syndrome, type 2 diabetes, heart disease, and even death. It is recommended to combine diet and exercise (cardiovascular and resistance training) to improve your body composition. Seek guidance from your physician and exercise physiologist before implementing an exercise routine.  Exercise Action Plan Clinical staff conducted group or individual video education with verbal and written material and guidebook.  Patient learns the recommended strategies to achieve and enjoy long-term exercise adherence, including variety, self-motivation, self-efficacy, and positive decision making. Benefits of exercise include fitness, good health, weight management, more energy, better sleep, less stress, and overall well-being.  Medical   Heart Disease Risk Reduction Clinical staff  conducted group or individual video education with verbal and written material and guidebook.  Patient learns our heart is our most vital organ as it circulates oxygen, nutrients, white blood cells, and hormones throughout the entire body, and carries waste away. Data supports a plant-based eating plan like the Pritikin Program for its effectiveness in slowing progression of and reversing heart disease. The video provides a number of recommendations to address heart disease.   Metabolic Syndrome and Belly Fat  Clinical staff conducted group or individual video education with verbal and written material and guidebook.  Patient learns what metabolic syndrome is, how it leads to heart disease, and how one can reverse it and keep it from coming back. You have metabolic syndrome if you have 3 of the following 5 criteria: abdominal obesity, high blood pressure, high triglycerides, low HDL cholesterol, and high blood sugar.  Hypertension and Heart Disease Clinical staff conducted group or individual video education with verbal and written material and guidebook.  Patient learns that high blood pressure, or hypertension, is very common in the [place]. Hypertension is largely due to excessive salt intake, but other important risk factors include being overweight, physical inactivity, drinking too much alcohol, smoking, and not eating enough potassium from fruits and vegetables. High blood pressure is a leading risk factor for heart attack, stroke, congestive heart failure, dementia, kidney failure, and premature death. Long-term effects of excessive salt intake include stiffening of the arteries and thickening of heart muscle and organ damage. Recommendations include ways to reduce hypertension and the risk of heart disease.  Diseases of Our Time - Focusing on Diabetes Clinical staff conducted group or individual video education with verbal and written material and guidebook.  Patient learns why the best  way to stop diseases of our time is prevention, through food and other lifestyle changes. Medicine (such as prescription pills and surgeries) is often only a Band-Aid on the problem, not a long-term solution. Most common diseases of our time include obesity, type 2 diabetes, hypertension, heart disease, and cancer. The Pritikin Program is recommended and has been proven to help reduce, reverse, and/or prevent the damaging effects of metabolic syndrome.  Nutrition   Overview of the Pritikin Eating Plan  Clinical staff conducted group or individual video education with verbal and written material and guidebook.  Patient learns about the Pritikin Eating  Plan for disease risk reduction. The Pritikin Eating Plan emphasizes a wide variety of unrefined, minimally-processed carbohydrates, like fruits, vegetables, whole grains, and legumes. Go, Caution, and Stop food choices are explained. Plant-based and lean animal proteins are emphasized. Rationale provided for low sodium intake for blood pressure control, low added sugars for blood sugar stabilization, and low added fats and oils for coronary artery disease risk reduction and weight management.  Calorie Density  Clinical staff conducted group or individual video education with verbal and written material and guidebook.  Patient learns about calorie density and how it impacts the Pritikin Eating Plan. Knowing the characteristics of the food you choose will help you decide whether those foods will lead to weight gain or weight loss, and whether you want to consume more or less of them. Weight loss is usually a side effect of the Pritikin Eating Plan because of its focus on low calorie-dense foods.  Label Reading  Clinical staff conducted group or individual video education with verbal and written material and guidebook.  Patient learns about the Pritikin recommended label reading guidelines and corresponding recommendations regarding calorie density, added  sugars, sodium content, and whole grains.  Dining Out - Part 1  Clinical staff conducted group or individual video education with verbal and written material and guidebook.  Patient learns that restaurant meals can be sabotaging because they can be so high in calories, fat, sodium, and/or sugar. Patient learns recommended strategies on how to positively address this and avoid unhealthy pitfalls.  Facts on Fats  Clinical staff conducted group or individual video education with verbal and written material and guidebook.  Patient learns that lifestyle modifications can be just as effective, if not more so, as many medications for lowering your risk of heart disease. A Pritikin lifestyle can help to reduce your risk of inflammation and atherosclerosis (cholesterol build-up, or plaque, in the artery walls). Lifestyle interventions such as dietary choices and physical activity address the cause of atherosclerosis. A review of the types of fats and their impact on blood cholesterol levels, along with dietary recommendations to reduce fat intake is also included.  Nutrition Action Plan  Clinical staff conducted group or individual video education with verbal and written material and guidebook.  Patient learns how to incorporate Pritikin recommendations into their lifestyle. Recommendations include planning and keeping personal health goals in mind as an important part of their success.  Healthy Mind-Set    Healthy Minds, Bodies, Hearts  Clinical staff conducted group or individual video education with verbal and written material and guidebook.  Patient learns how to identify when they are stressed. Video will discuss the impact of that stress, as well as the many benefits of stress management. Patient will also be introduced to stress management techniques. The way we think, act, and feel has an impact on our hearts.  How Our Thoughts Can Heal Our Hearts  Clinical staff conducted group or individual  video education with verbal and written material and guidebook.  Patient learns that negative thoughts can cause depression and anxiety. This can result in negative lifestyle behavior and serious health problems. Cognitive behavioral therapy is an effective method to help control our thoughts in order to change and improve our emotional outlook.  Additional Videos:  Exercise    Improving Performance  Clinical staff conducted group or individual video education with verbal and written material and guidebook.  Patient learns to use a non-linear approach by alternating intensity levels and lengths of time spent exercising to help  burn more calories and lose more body fat. Cardiovascular exercise helps improve heart health, metabolism, hormonal balance, blood sugar control, and recovery from fatigue. Resistance training improves strength, endurance, balance, coordination, reaction time, metabolism, and muscle mass. Flexibility exercise improves circulation, posture, and balance. Seek guidance from your physician and exercise physiologist before implementing an exercise routine and learn your capabilities and proper form for all exercise.  Introduction to Yoga  Clinical staff conducted group or individual video education with verbal and written material and guidebook.  Patient learns about yoga, a discipline of the coming together of mind, breath, and body. The benefits of yoga include improved flexibility, improved range of motion, better posture and core strength, increased lung function, weight loss, and positive self-image. Yoga's heart health benefits include lowered blood pressure, healthier heart rate, decreased cholesterol and triglyceride levels, improved immune function, and reduced stress. Seek guidance from your physician and exercise physiologist before implementing an exercise routine and learn your capabilities and proper form for all exercise.  Medical   Aging: Enhancing Your Quality of  Life  Clinical staff conducted group or individual video education with verbal and written material and guidebook.  Patient learns key strategies and recommendations to stay in good physical health and enhance quality of life, such as prevention strategies, having an advocate, securing a Health Care Proxy and Power of Attorney, and keeping a list of medications and system for tracking them. It also discusses how to avoid risk for bone loss.  Biology of Weight Control  Clinical staff conducted group or individual video education with verbal and written material and guidebook.  Patient learns that weight gain occurs because we consume more calories than we burn (eating more, moving less). Even if your body weight is normal, you may have higher ratios of fat compared to muscle mass. Too much body fat puts you at increased risk for cardiovascular disease, heart attack, stroke, type 2 diabetes, and obesity-related cancers. In addition to exercise, following the Pritikin Eating Plan can help reduce your risk.  Decoding Lab Results  Clinical staff conducted group or individual video education with verbal and written material and guidebook.  Patient learns that lab test reflects one measurement whose values change over time and are influenced by many factors, including medication, stress, sleep, exercise, food, hydration, pre-existing medical conditions, and more. It is recommended to use the knowledge from this video to become more involved with your lab results and evaluate your numbers to speak with your doctor.   Diseases of Our Time - Overview  Clinical staff conducted group or individual video education with verbal and written material and guidebook.  Patient learns that according to the CDC, 50% to 70% of chronic diseases (such as obesity, type 2 diabetes, elevated lipids, hypertension, and heart disease) are avoidable through lifestyle improvements including healthier food choices, listening to  satiety cues, and increased physical activity.  Sleep Disorders Clinical staff conducted group or individual video education with verbal and written material and guidebook.  Patient learns how good quality and duration of sleep are important to overall health and well-being. Patient also learns about sleep disorders and how they impact health along with recommendations to address them, including discussing with a physician.  Nutrition  Dining Out - Part 2 Clinical staff conducted group or individual video education with verbal and written material and guidebook.  Patient learns how to plan ahead and communicate in order to maximize their dining experience in a healthy and nutritious manner. Included are recommended food  choices based on the type of restaurant the patient is visiting.   Fueling a Banker conducted group or individual video education with verbal and written material and guidebook.  There is a strong connection between our food choices and our health. Diseases like obesity and type 2 diabetes are very prevalent and are in large-part due to lifestyle choices. The Pritikin Eating Plan provides plenty of food and hunger-curbing satisfaction. It is easy to follow, affordable, and helps reduce health risks.  Menu Workshop  Clinical staff conducted group or individual video education with verbal and written material and guidebook.  Patient learns that restaurant meals can sabotage health goals because they are often packed with calories, fat, sodium, and sugar. Recommendations include strategies to plan ahead and to communicate with the manager, chef, or server to help order a healthier meal.  Planning Your Eating Strategy  Clinical staff conducted group or individual video education with verbal and written material and guidebook.  Patient learns about the Pritikin Eating Plan and its benefit of reducing the risk of disease. The Pritikin Eating Plan does not focus on  calories. Instead, it emphasizes high-quality, nutrient-rich foods. By knowing the characteristics of the foods, we choose, we can determine their calorie density and make informed decisions.  Targeting Your Nutrition Priorities  Clinical staff conducted group or individual video education with verbal and written material and guidebook.  Patient learns that lifestyle habits have a tremendous impact on disease risk and progression. This video provides eating and physical activity recommendations based on your personal health goals, such as reducing LDL cholesterol, losing weight, preventing or controlling type 2 diabetes, and reducing high blood pressure.  Vitamins and Minerals  Clinical staff conducted group or individual video education with verbal and written material and guidebook.  Patient learns different ways to obtain key vitamins and minerals, including through a recommended healthy diet. It is important to discuss all supplements you take with your doctor.   Healthy Mind-Set    Smoking Cessation  Clinical staff conducted group or individual video education with verbal and written material and guidebook.  Patient learns that cigarette smoking and tobacco addiction pose a serious health risk which affects millions of people. Stopping smoking will significantly reduce the risk of heart disease, lung disease, and many forms of cancer. Recommended strategies for quitting are covered, including working with your doctor to develop a successful plan.  Culinary   Becoming a Set designer conducted group or individual video education with verbal and written material and guidebook.  Patient learns that cooking at home can be healthy, cost-effective, quick, and puts them in control. Keys to cooking healthy recipes will include looking at your recipe, assessing your equipment needs, planning ahead, making it simple, choosing cost-effective seasonal ingredients, and limiting the use of  added fats, salts, and sugars.  Cooking - Breakfast and Snacks  Clinical staff conducted group or individual video education with verbal and written material and guidebook.  Patient learns how important breakfast is to satiety and nutrition through the entire day. Recommendations include key foods to eat during breakfast to help stabilize blood sugar levels and to prevent overeating at meals later in the day. Planning ahead is also a key component.  Cooking - Educational psychologist conducted group or individual video education with verbal and written material and guidebook.  Patient learns eating strategies to improve overall health, including an approach to cook more at home. Recommendations include thinking  of animal protein as a side on your plate rather than center stage and focusing instead on lower calorie dense options like vegetables, fruits, whole grains, and plant-based proteins, such as beans. Making sauces in large quantities to freeze for later and leaving the skin on your vegetables are also recommended to maximize your experience.  Cooking - Healthy Salads and Dressing Clinical staff conducted group or individual video education with verbal and written material and guidebook.  Patient learns that vegetables, fruits, whole grains, and legumes are the foundations of the Pritikin Eating Plan. Recommendations include how to incorporate each of these in flavorful and healthy salads, and how to create homemade salad dressings. Proper handling of ingredients is also covered. Cooking - Soups and [employer] - Soups and Desserts Clinical staff conducted group or individual video education with verbal and written material and guidebook.  Patient learns that Pritikin soups and desserts make for easy, nutritious, and delicious snacks and meal components that are low in sodium, fat, sugar, and calorie density, while high in vitamins, minerals, and filling fiber. Recommendations  include simple and healthy ideas for soups and desserts.   Overview     The Pritikin Solution Program Overview Clinical staff conducted group or individual video education with verbal and written material and guidebook.  Patient learns that the results of the Pritikin Program have been documented in more than 100 articles published in peer-reviewed journals, and the benefits include reducing risk factors for (and, in some cases, even reversing) high cholesterol, high blood pressure, type 2 diabetes, obesity, and more! An overview of the three key pillars of the Pritikin Program will be covered: eating well, doing regular exercise, and having a healthy mind-set.  WORKSHOPS  Exercise: Exercise Basics: Building Your Action Plan Clinical staff led group instruction and group discussion with PowerPoint presentation and patient guidebook. To enhance the learning environment the use of posters, models and videos may be added. At the conclusion of this workshop, patients will comprehend the difference between physical activity and exercise, as well as the benefits of incorporating both, into their routine. Patients will understand the FITT (Frequency, Intensity, Time, and Type) principle and how to use it to build an exercise action plan. In addition, safety concerns and other considerations for exercise and cardiac rehab will be addressed by the presenter. The purpose of this lesson is to promote a comprehensive and effective weekly exercise routine in order to improve patients' overall level of fitness.   Managing Heart Disease: Your Path to a Healthier Heart Clinical staff led group instruction and group discussion with PowerPoint presentation and patient guidebook. To enhance the learning environment the use of posters, models and videos may be added.At the conclusion of this workshop, patients will understand the anatomy and physiology of the heart. Additionally, they will understand how  Pritikin's three pillars impact the risk factors, the progression, and the management of heart disease.  The purpose of this lesson is to provide a high-level overview of the heart, heart disease, and how the Pritikin lifestyle positively impacts risk factors.  Exercise Biomechanics Clinical staff led group instruction and group discussion with PowerPoint presentation and patient guidebook. To enhance the learning environment the use of posters, models and videos may be added. Patients will learn how the structural parts of their bodies function and how these functions impact their daily activities, movement, and exercise. Patients will learn how to promote a neutral spine, learn how to manage pain, and identify ways to improve their physical  movement in order to promote healthy living. The purpose of this lesson is to expose patients to common physical limitations that impact physical activity. Participants will learn practical ways to adapt and manage aches and pains, and to minimize their effect on regular exercise. Patients will learn how to maintain good posture while sitting, walking, and lifting.  Balance Training and Fall Prevention  Clinical staff led group instruction and group discussion with PowerPoint presentation and patient guidebook. To enhance the learning environment the use of posters, models and videos may be added. At the conclusion of this workshop, patients will understand the importance of their sensorimotor skills (vision, proprioception, and the vestibular system) in maintaining their ability to balance as they age. Patients will apply a variety of balancing exercises that are appropriate for their current level of function. Patients will understand the common causes for poor balance, possible solutions to these problems, and ways to modify their physical environment in order to minimize their fall risk. The purpose of this lesson is to teach patients about the  importance of maintaining balance as they age and ways to minimize their risk of falling.  WORKSHOPS   Nutrition:  Fueling a Ship broker led group instruction and group discussion with PowerPoint presentation and patient guidebook. To enhance the learning environment the use of posters, models and videos may be added. Patients will review the foundational principles of the Pritikin Eating Plan and understand what constitutes a serving size in each of the food groups. Patients will also learn Pritikin-friendly foods that are better choices when away from home and review make-ahead meal and snack options. Calorie density will be reviewed and applied to three nutrition priorities: weight maintenance, weight loss, and weight gain. The purpose of this lesson is to reinforce (in a group setting) the key concepts around what patients are recommended to eat and how to apply these guidelines when away from home by planning and selecting Pritikin-friendly options. Patients will understand how calorie density may be adjusted for different weight management goals.  Mindful Eating  Clinical staff led group instruction and group discussion with PowerPoint presentation and patient guidebook. To enhance the learning environment the use of posters, models and videos may be added. Patients will briefly review the concepts of the Pritikin Eating Plan and the importance of low-calorie dense foods. The concept of mindful eating will be introduced as well as the importance of paying attention to internal hunger signals. Triggers for non-hunger eating and techniques for dealing with triggers will be explored. The purpose of this lesson is to provide patients with the opportunity to review the basic principles of the Pritikin Eating Plan, discuss the value of eating mindfully and how to measure internal cues of hunger and fullness using the Hunger Scale. Patients will also discuss reasons for non-hunger eating and  learn strategies to use for controlling emotional eating.  Targeting Your Nutrition Priorities Clinical staff led group instruction and group discussion with PowerPoint presentation and patient guidebook. To enhance the learning environment the use of posters, models and videos may be added. Patients will learn how to determine their genetic susceptibility to disease by reviewing their family history. Patients will gain insight into the importance of diet as part of an overall healthy lifestyle in mitigating the impact of genetics and other environmental insults. The purpose of this lesson is to provide patients with the opportunity to assess their personal nutrition priorities by looking at their family history, their own health history and current risk  factors. Patients will also be able to discuss ways of prioritizing and modifying the Pritikin Eating Plan for their highest risk areas  Menu  Clinical staff led group instruction and group discussion with PowerPoint presentation and patient guidebook. To enhance the learning environment the use of posters, models and videos may be added. Using menus brought in from [employer], or printed from [employer], patients will apply the Pritikin dining out guidelines that were presented in the [employer] video. Patients will also be able to practice these guidelines in a variety of provided scenarios. The purpose of this lesson is to provide patients with the opportunity to practice hands-on learning of the Pritikin Dining Out guidelines with actual menus and practice scenarios.  Label Reading Clinical staff led group instruction and group discussion with PowerPoint presentation and patient guidebook. To enhance the learning environment the use of posters, models and videos may be added. Patients will review and discuss the Pritikin label reading guidelines presented in Pritikin's Label Reading Educational series video. Using fool  labels brought in from local grocery stores and markets, patients will apply the label reading guidelines and determine if the packaged food meet the Pritikin guidelines. The purpose of this lesson is to provide patients with the opportunity to review, discuss, and practice hands-on learning of the Pritikin Label Reading guidelines with actual packaged food labels. Cooking School  Pritikin's [employer] are designed to teach patients ways to prepare quick, simple, and affordable recipes at home. The importance of nutrition's role in chronic disease risk reduction is reflected in its emphasis in the overall Pritikin program. By learning how to prepare essential core Pritikin Eating Plan recipes, patients will increase control over what they eat; be able to customize the flavor of foods without the use of added salt, sugar, or fat; and improve the quality of the food they consume. By learning a set of core recipes which are easily assembled, quickly prepared, and affordable, patients are more likely to prepare more healthy foods at home. These workshops focus on convenient breakfasts, simple entres, side dishes, and desserts which can be prepared with minimal effort and are consistent with nutrition recommendations for cardiovascular risk reduction. Cooking [employer] are taught by a Armed forces logistics/support/administrative officer (RD) who has been trained by the [employer]. The chef or RD has a clear understanding of the importance of minimizing - if not completely eliminating - added fat, sugar, and sodium in recipes. Throughout the series of Cooking School Workshop sessions, patients will learn about healthy ingredients and efficient methods of cooking to build confidence in their capability to prepare    Cooking School weekly topics:  Adding Flavor- Sodium-Free  Fast and Healthy Breakfasts  Powerhouse Plant-Based Proteins  Satisfying Salads and Dressings  Simple Sides and  Sauces  International Cuisine-Spotlight on the [employer] Zones  Delicious Desserts  Savory Soups  [employer] - Meals in a Astronomer Appetizers and Snacks  Comforting Weekend Breakfasts  One-Pot Wonders   Fast Evening Meals  Landscape architect Your Pritikin Plate  WORKSHOPS   Healthy Mindset (Psychosocial):  Focused Goals, Sustainable Changes Clinical staff led group instruction and group discussion with PowerPoint presentation and patient guidebook. To enhance the learning environment the use of posters, models and videos may be added. Patients will be able to apply effective goal setting strategies to establish at least one personal goal, and then take consistent, meaningful action toward that goal. They will learn  to identify common barriers to achieving personal goals and develop strategies to overcome them. Patients will also gain an understanding of how our mind-set can impact our ability to achieve goals and the importance of cultivating a positive and growth-oriented mind-set. The purpose of this lesson is to provide patients with a deeper understanding of how to set and achieve personal goals, as well as the tools and strategies needed to overcome common obstacles which may arise along the way.  From Head to Heart: The Power of a Healthy Outlook  Clinical staff led group instruction and group discussion with PowerPoint presentation and patient guidebook. To enhance the learning environment the use of posters, models and videos may be added. Patients will be able to recognize and describe the impact of emotions and mood on physical health. They will discover the importance of self-care and explore self-care practices which may work for them. Patients will also learn how to utilize the 4 C's to cultivate a healthier outlook and better manage stress and challenges. The purpose of this lesson is to demonstrate to patients how a healthy outlook is an essential part of  maintaining good health, especially as they continue their cardiac rehab journey.  Healthy Sleep for a Healthy Heart Clinical staff led group instruction and group discussion with PowerPoint presentation and patient guidebook. To enhance the learning environment the use of posters, models and videos may be added. At the conclusion of this workshop, patients will be able to demonstrate knowledge of the importance of sleep to overall health, well-being, and quality of life. They will understand the symptoms of, and treatments for, common sleep disorders. Patients will also be able to identify daytime and nighttime behaviors which impact sleep, and they will be able to apply these tools to help manage sleep-related challenges. The purpose of this lesson is to provide patients with a general overview of sleep and outline the importance of quality sleep. Patients will learn about a few of the most common sleep disorders. Patients will also be introduced to the concept of "sleep hygiene," and discover ways to self-manage certain sleeping problems through simple daily behavior changes. Finally, the workshop will motivate patients by clarifying the links between quality sleep and their goals of heart-healthy living.   Recognizing and Reducing Stress Clinical staff led group instruction and group discussion with PowerPoint presentation and patient guidebook. To enhance the learning environment the use of posters, models and videos may be added. At the conclusion of this workshop, patients will be able to understand the types of stress reactions, differentiate between acute and chronic stress, and recognize the impact that chronic stress has on their health. They will also be able to apply different coping mechanisms, such as reframing negative self-talk. Patients will have the opportunity to practice a variety of stress management techniques, such as deep abdominal breathing, progressive muscle relaxation, and/or  guided imagery.  The purpose of this lesson is to educate patients on the role of stress in their lives and to provide healthy techniques for coping with it.  Learning Barriers/Preferences:  Learning Barriers/Preferences - 01/28/24 1542       Learning Barriers/Preferences   Learning Barriers Sight    Learning Preferences Group Instruction;Individual Instruction;Pictoral;Skilled Demonstration;Verbal Instruction             Education Topics:  Knowledge Questionnaire Score:  Knowledge Questionnaire Score - 01/28/24 1538       Knowledge Questionnaire Score   Pre Score 23/24  Core Components/Risk Factors/Patient Goals at Admission:  Personal Goals and Risk Factors at Admission - 01/28/24 1354       Core Components/Risk Factors/Patient Goals on Admission    Weight Management Weight Loss    Tobacco Cessation --   Quit Smoking 40 years ago   Diabetes Yes    Intervention Provide education about signs/symptoms and action to take for hypo/hyperglycemia.;Provide education about proper nutrition, including hydration, and aerobic/resistive exercise prescription along with prescribed medications to achieve blood glucose in normal ranges: Fasting glucose 65-99 mg/dL    Expected Outcomes Short Term: Participant verbalizes understanding of the signs/symptoms and immediate care of hyper/hypoglycemia, proper foot care and importance of medication, aerobic/resistive exercise and nutrition plan for blood glucose control.;Long Term: Attainment of HbA1C < 7%.    Hypertension Yes    Intervention Provide education on lifestyle modifcations including regular physical activity/exercise, weight management, moderate sodium restriction and increased consumption of fresh fruit, vegetables, and low fat dairy, alcohol moderation, and smoking cessation.;Monitor prescription use compliance.    Expected Outcomes Short Term: Continued assessment and intervention until BP is < 140/63mm HG in  hypertensive participants. < 130/108mm HG in hypertensive participants with diabetes, heart failure or chronic kidney disease.;Long Term: Maintenance of blood pressure at goal levels.    Lipids Yes    Intervention Provide education and support for participant on nutrition & aerobic/resistive exercise along with prescribed medications to achieve LDL 70mg , HDL >40mg .    Expected Outcomes Short Term: Participant states understanding of desired cholesterol values and is compliant with medications prescribed. Participant is following exercise prescription and nutrition guidelines.;Long Term: Cholesterol controlled with medications as prescribed, with individualized exercise RX and with personalized nutrition plan. Value goals: LDL < 70mg , HDL > 40 mg.    Stress Yes   self-imposed   Intervention Offer individual and/or small group education and counseling on adjustment to heart disease, stress management and health-related lifestyle change. Teach and support self-help strategies.;Refer participants experiencing significant psychosocial distress to appropriate mental health specialists for further evaluation and treatment. When possible, include family members and significant others in education/counseling sessions.    Expected Outcomes Short Term: Participant demonstrates changes in health-related behavior, relaxation and other stress management skills, ability to obtain effective social support, and compliance with psychotropic medications if prescribed.;Long Term: Emotional wellbeing is indicated by absence of clinically significant psychosocial distress or social isolation.             Core Components/Risk Factors/Patient Goals Review:   Goals and Risk Factor Review     Row Name 02/03/24 1351 02/23/24 0956 03/23/24 0804         Core Components/Risk Factors/Patient Goals Review   Personal Goals Review Weight Management/Obesity;Lipids;Hypertension;Diabetes Weight  Management/Obesity;Lipids;Hypertension;Diabetes Weight Management/Obesity;Lipids;Hypertension;Diabetes     Review Deray started cardiac rehab on 02/03/24. Yuriel did well with exercise. Vital signs were stable. CBG's were elevated as Trenton did not take his insulin as he was running late. Dyshaun says he will be back on track on Wed. Shamere is doing  well exercise at cardiac rehab.  Vital signs have been stable. CBG's have been variable. Alejandra is doing  well exercise at cardiac rehab since his return due to cold symptoms.  Vital signs and CBG's have been stable.     Expected Outcomes Sarkis will continue to participate in cardiac rehab for exercise, nutrition and lifestyle modifications Cordarious will continue to participate in cardiac rehab for exercise, nutrition and lifestyle modifications Dearion will continue to participate in cardiac rehab for  exercise, nutrition and lifestyle modifications              Core Components/Risk Factors/Patient Goals at Discharge (Final Review):   Goals and Risk Factor Review - 03/23/24 0804       Core Components/Risk Factors/Patient Goals Review   Personal Goals Review Weight Management/Obesity;Lipids;Hypertension;Diabetes    Review Rayshad is doing  well exercise at cardiac rehab since his return due to cold symptoms.  Vital signs and CBG's have been stable.    Expected Outcomes Filmore will continue to participate in cardiac rehab for exercise, nutrition and lifestyle modifications             ITP Comments:  ITP Comments     Row Name 01/28/24 1347 02/23/24 0954 03/23/24 0801       ITP Comments Dr. Armanda Magic medical director. Introduction to pritikin education/intensive cardiac rehab. Initial orientation packet reviewed with patient. 30 Day ITP Review. Keyandre has good attendance and participation in cardiac rehab. 30 Day ITP Review. Paydon continues to have  good attendance and participation with exercise at cardiac rehab.              Comments: See ITP Comments

## 2024-03-24 ENCOUNTER — Other Ambulatory Visit: Payer: Self-pay

## 2024-03-24 ENCOUNTER — Encounter (HOSPITAL_COMMUNITY)
Admission: RE | Admit: 2024-03-24 | Discharge: 2024-03-24 | Disposition: A | Discharge status: Home / Self Care | Payer: Medicare Other | Source: Ambulatory Visit | Attending: Cardiology | Admitting: Cardiology

## 2024-03-24 DIAGNOSIS — Z955 Presence of coronary angioplasty implant and graft: Secondary | ICD-10-CM | POA: Diagnosis not present

## 2024-03-24 NOTE — Progress Notes (Unsigned)
  Cardiology Office Note    Patient Name: Nathan Duncan Date of Encounter: 03/24/2024  Primary Care Provider:  Koren Shiver, DO Primary Cardiologist:  Donato Schultz, MD Primary Electrophysiologist: None   Past Medical History    Past Medical History:  Diagnosis Date   Diabetes [facility])    Dyslipidemia     History of Present Illness  Nathan Duncan is a 73 y.o. male with a PMH of CAD s/p LHC PCI/DES x 1 to P LAD proximal RI, HLD, HTN, OSA, DM type II, Cheyne-Stokes breathing, aortic atherosclerosis who presents today for 19-month follow-up.  Nathan Duncan was last seen on 12/26/2023 for post PCI follow-up.  He reported some increased fatigue and tiredness but denied any chest pain.  He was cleared for cardiac rehab and had no changes made to his medications.  He was advised to try natural sleep aids for his insomnia.   Patient denies chest pain, palpitations, dyspnea, PND, orthopnea, nausea, vomiting, dizziness, syncope, edema, weight gain, or early satiety.   Discussed the use of AI scribe software for clinical note transcription with the patient, who gave verbal consent to proceed.  History of Present Illness    ***Notes: -Last ischemic evaluation:  Review of Systems  Please see the history of present illness.    All other systems reviewed and are otherwise negative except as noted above.  Physical Exam    Wt Readings from Last 3 Encounters:  01/28/24 194 lb 0.1 oz (88 kg)  01/05/24 189 lb (85.7 kg)  12/26/23 193 lb 9.6 oz (87.8 kg)   ZO:[id number] were no vitals filed for this visit.,There is no height or weight on file to calculate BMI. GEN: Well nourished, well developed in no acute distress Neck: No JVD; No carotid bruits Pulmonary: Clear to auscultation without rales, wheezing or rhonchi  Cardiovascular: Normal rate. Regular rhythm. Normal S1. Normal S2.   Murmurs: There is no murmur.  ABDOMEN: Soft, non-tender, non-distended EXTREMITIES:  No edema; No  deformity   EKG/LABS/ Recent Cardiac Studies   ECG personally reviewed by me today - ***  Risk Assessment/Calculations:   {Does this patient have ATRIAL FIBRILLATION?:[phone]}      Lab Results  Component Value Date   WBC 6.7 12/02/2023   HGB 13.8 12/02/2023   HCT 40.5 12/02/2023   MCV 86.0 12/02/2023   PLT 217 12/02/2023   Lab Results  Component Value Date   CREATININE 1.29 (H) 12/02/2023   BUN 36 (H) 12/02/2023   NA 135 12/02/2023   K 4.3 12/02/2023   CL 107 12/02/2023   CO2 19 (L) 12/02/2023   Lab Results  Component Value Date   CHOL 144 12/02/2023   HDL 31 (L) 12/02/2023   LDLCALC 65 12/02/2023   TRIG 238 (H) 12/02/2023   CHOLHDL 4.6 12/02/2023    Lab Results  Component Value Date   HGBA1C 7.9 (H) 12/25/2023   Assessment & Plan    1.CAD: -s/p LHC revealing three-vessel disease and PCI/DES x 1 to P LAD proximal RI -Today patient reports  2.Hypertriglyceridemia:  3.  DM type II:  4.  Essential hypertension:  5.  History of OSA:  Disposition: Follow-up with Donato Schultz, MD or APP in *** months    Signed, Napoleon Form, Leodis Rains, NP 03/24/2024, 6:39 PM [facility]

## 2024-03-25 ENCOUNTER — Encounter: Payer: Self-pay | Admitting: Nurse Practitioner

## 2024-03-25 ENCOUNTER — Ambulatory Visit: Payer: Medicare Other | Attending: Nurse Practitioner | Admitting: Nurse Practitioner

## 2024-03-25 VITALS — BP 130/62 | HR 87 | Ht 71.0 in | Wt 193.4 lb

## 2024-03-25 DIAGNOSIS — G4733 Obstructive sleep apnea (adult) (pediatric): Secondary | ICD-10-CM | POA: Insufficient documentation

## 2024-03-25 DIAGNOSIS — I2510 Atherosclerotic heart disease of native coronary artery without angina pectoris: Secondary | ICD-10-CM | POA: Diagnosis not present

## 2024-03-25 DIAGNOSIS — E119 Type 2 diabetes mellitus without complications: Secondary | ICD-10-CM | POA: Diagnosis not present

## 2024-03-25 DIAGNOSIS — E781 Pure hyperglyceridemia: Secondary | ICD-10-CM | POA: Diagnosis not present

## 2024-03-25 DIAGNOSIS — I10 Essential (primary) hypertension: Secondary | ICD-10-CM | POA: Diagnosis not present

## 2024-03-25 NOTE — Patient Instructions (Signed)
 Medication Instructions:  Your physician recommends that you continue on your current medications as directed. Please refer to the Current Medication list given to you today. *If you need a refill on your cardiac medications before your next appointment, please call your pharmacy*  Lab Work: None ordered If you have labs (blood work) drawn today and your tests are completely normal, you will receive your results only by: MyChart Message (if you have MyChart) OR A paper copy in the mail If you have any lab test that is abnormal or we need to change your treatment, we will call you to review the results.  Testing/Procedures: None ordered  Follow-Up: At [facility], you and your health needs are our priority.  As part of our continuing mission to provide you with exceptional heart care, our providers are all part of one team.  This team includes your primary Cardiologist (physician) and Advanced Practice Providers or APPs (Physician Assistants and Nurse Practitioners) who all work together to provide you with the care you need, when you need it.  Your next appointment:   6 month(s)  Provider:   Neila Gear, NP     We recommend signing up for the patient portal called "MyChart".  Sign up information is provided on this After Visit Summary.  MyChart is used to connect with patients for Virtual Visits (Telemedicine).  Patients are able to view lab/test results, encounter notes, upcoming appointments, etc.  Non-urgent messages can be sent to your provider as well.   To learn more about what you can do with MyChart, go to [email or web address].   Other Instructions       1st Floor: - Lobby - Registration  - Pharmacy  - Lab - Cafe  2nd Floor: - PV Lab - Diagnostic Testing (echo, CT, nuclear med)  3rd Floor: - Vacant  4th Floor: - TCTS (cardiothoracic surgery) - AFib Clinic - Structural Heart Clinic - Vascular Surgery  - Vascular Ultrasound  5th Floor: -  HeartCare Cardiology (general and EP) - Clinical Pharmacy for coumadin, hypertension, lipid, weight-loss medications, and med management appointments    Valet parking services will be available as well.

## 2024-03-26 ENCOUNTER — Encounter (HOSPITAL_COMMUNITY)
Admission: RE | Admit: 2024-03-26 | Discharge: 2024-03-26 | Disposition: A | Discharge status: Home / Self Care | Payer: Medicare Other | Source: Ambulatory Visit | Attending: Cardiology | Admitting: Cardiology

## 2024-03-26 ENCOUNTER — Other Ambulatory Visit (HOSPITAL_COMMUNITY): Payer: Self-pay

## 2024-03-26 DIAGNOSIS — Z955 Presence of coronary angioplasty implant and graft: Secondary | ICD-10-CM

## 2024-03-29 ENCOUNTER — Encounter (HOSPITAL_COMMUNITY)
Admission: RE | Admit: 2024-03-29 | Discharge: 2024-03-29 | Disposition: A | Discharge status: Home / Self Care | Payer: Medicare Other | Source: Ambulatory Visit | Attending: Cardiology

## 2024-03-29 ENCOUNTER — Other Ambulatory Visit: Payer: Medicare Other

## 2024-03-29 DIAGNOSIS — Z955 Presence of coronary angioplasty implant and graft: Secondary | ICD-10-CM

## 2024-03-29 DIAGNOSIS — Z794 Long term (current) use of insulin: Secondary | ICD-10-CM | POA: Diagnosis not present

## 2024-03-29 DIAGNOSIS — E1169 Type 2 diabetes mellitus with other specified complication: Secondary | ICD-10-CM | POA: Diagnosis not present

## 2024-03-30 LAB — BASIC METABOLIC PANEL WITH GFR
BUN/Creatinine Ratio: 18 (calc) (ref 6–22)
BUN: 24 mg/dL (ref 7–25)
CO2: 25 mmol/L (ref 20–32)
Calcium: 9.9 mg/dL (ref 8.6–10.3)
Chloride: 107 mmol/L (ref 98–110)
Creat: 1.3 mg/dL — ABNORMAL HIGH (ref 0.70–1.28)
Glucose, Bld: 184 mg/dL — ABNORMAL HIGH (ref 65–99)
Potassium: 5.1 mmol/L (ref 3.5–5.3)
Sodium: 141 mmol/L (ref 135–146)
eGFR: 57 mL/min/{1.73_m2} — ABNORMAL LOW (ref >=60)

## 2024-03-30 LAB — HEMOGLOBIN A1C
Hgb A1c MFr Bld: 8 %{Hb} — ABNORMAL HIGH (ref <5.7)
Mean Plasma Glucose: 183 mg/dL
eAG (mmol/L): 10.1 mmol/L

## 2024-03-31 ENCOUNTER — Encounter (HOSPITAL_COMMUNITY)
Admission: RE | Admit: 2024-03-31 | Discharge: 2024-03-31 | Disposition: A | Discharge status: Home / Self Care | Payer: Medicare Other | Source: Ambulatory Visit | Attending: Cardiology | Admitting: Cardiology

## 2024-03-31 ENCOUNTER — Encounter: Payer: Self-pay | Admitting: Endocrinology

## 2024-03-31 DIAGNOSIS — Z955 Presence of coronary angioplasty implant and graft: Secondary | ICD-10-CM

## 2024-04-01 DIAGNOSIS — E1142 Type 2 diabetes mellitus with diabetic polyneuropathy: Secondary | ICD-10-CM | POA: Diagnosis not present

## 2024-04-01 DIAGNOSIS — Z0000 Encounter for general adult medical examination without abnormal findings: Secondary | ICD-10-CM | POA: Diagnosis not present

## 2024-04-01 DIAGNOSIS — Z1211 Encounter for screening for malignant neoplasm of colon: Secondary | ICD-10-CM | POA: Diagnosis not present

## 2024-04-01 DIAGNOSIS — Z125 Encounter for screening for malignant neoplasm of prostate: Secondary | ICD-10-CM | POA: Diagnosis not present

## 2024-04-01 DIAGNOSIS — I10 Essential (primary) hypertension: Secondary | ICD-10-CM | POA: Diagnosis not present

## 2024-04-01 DIAGNOSIS — E782 Mixed hyperlipidemia: Secondary | ICD-10-CM | POA: Diagnosis not present

## 2024-04-01 DIAGNOSIS — I2510 Atherosclerotic heart disease of native coronary artery without angina pectoris: Secondary | ICD-10-CM | POA: Diagnosis not present

## 2024-04-01 DIAGNOSIS — I700 Atherosclerosis of aorta: Secondary | ICD-10-CM | POA: Diagnosis not present

## 2024-04-02 ENCOUNTER — Encounter (HOSPITAL_COMMUNITY)
Admission: RE | Admit: 2024-04-02 | Discharge: 2024-04-02 | Disposition: A | Discharge status: Home / Self Care | Payer: Medicare Other | Source: Ambulatory Visit | Attending: Cardiology | Admitting: Cardiology

## 2024-04-02 DIAGNOSIS — Z955 Presence of coronary angioplasty implant and graft: Secondary | ICD-10-CM

## 2024-04-05 ENCOUNTER — Encounter (HOSPITAL_COMMUNITY)
Admission: RE | Admit: 2024-04-05 | Discharge: 2024-04-05 | Disposition: A | Discharge status: Home / Self Care | Payer: Medicare Other | Source: Ambulatory Visit | Attending: Cardiology

## 2024-04-05 DIAGNOSIS — Z955 Presence of coronary angioplasty implant and graft: Secondary | ICD-10-CM | POA: Diagnosis not present

## 2024-04-07 ENCOUNTER — Encounter (HOSPITAL_COMMUNITY)
Admission: RE | Admit: 2024-04-07 | Discharge: 2024-04-07 | Disposition: A | Discharge status: Home / Self Care | Payer: Medicare Other | Source: Ambulatory Visit | Attending: Cardiology | Admitting: Cardiology

## 2024-04-07 DIAGNOSIS — Z955 Presence of coronary angioplasty implant and graft: Secondary | ICD-10-CM | POA: Diagnosis not present

## 2024-04-07 DIAGNOSIS — Z1211 Encounter for screening for malignant neoplasm of colon: Secondary | ICD-10-CM | POA: Diagnosis not present

## 2024-04-09 ENCOUNTER — Encounter (HOSPITAL_COMMUNITY)
Admission: RE | Admit: 2024-04-09 | Discharge: 2024-04-09 | Disposition: A | Discharge status: Home / Self Care | Payer: Medicare Other | Source: Ambulatory Visit | Attending: Cardiology | Admitting: Cardiology

## 2024-04-09 DIAGNOSIS — Z955 Presence of coronary angioplasty implant and graft: Secondary | ICD-10-CM | POA: Diagnosis not present

## 2024-04-12 ENCOUNTER — Encounter: Payer: Self-pay | Admitting: Endocrinology

## 2024-04-12 ENCOUNTER — Ambulatory Visit (INDEPENDENT_AMBULATORY_CARE_PROVIDER_SITE_OTHER): Payer: Medicare Other | Admitting: Endocrinology

## 2024-04-12 ENCOUNTER — Encounter (HOSPITAL_COMMUNITY)
Admission: RE | Admit: 2024-04-12 | Discharge: 2024-04-12 | Disposition: A | Discharge status: Home / Self Care | Payer: Medicare Other | Source: Ambulatory Visit | Attending: Cardiology | Admitting: Cardiology

## 2024-04-12 VITALS — BP 114/80 | HR 80 | Resp 20 | Ht 71.0 in | Wt 188.6 lb

## 2024-04-12 DIAGNOSIS — Z794 Long term (current) use of insulin: Secondary | ICD-10-CM | POA: Diagnosis not present

## 2024-04-12 DIAGNOSIS — Z955 Presence of coronary angioplasty implant and graft: Secondary | ICD-10-CM

## 2024-04-12 DIAGNOSIS — E1165 Type 2 diabetes mellitus with hyperglycemia: Secondary | ICD-10-CM

## 2024-04-12 NOTE — Progress Notes (Signed)
 Cardiac Individual Treatment Plan  Patient Details  Name: Nathan Duncan MRN: [id number] Date of Birth: Jan 31, 1951 Referring Provider:   Flowsheet Row INTENSIVE CARDIAC REHAB ORIENT from 01/28/2024 in [facility]  Referring Provider Dorothye Gathers       Initial Encounter Date:  Flowsheet Row INTENSIVE CARDIAC REHAB ORIENT from 01/28/2024 in [facility] for [facility], [facility], & [facility]  Date 01/28/24       Visit Diagnosis: 12/02/23 S/P coronary artery stent placement  Patient's Home Medications on Admission:  Current Outpatient Medications:    aspirin  EC 81 MG tablet, Take 81 mg by mouth at bedtime., Disp: , Rfl:    atorvastatin (LIPITOR) 40 MG tablet, Take 40 mg by mouth daily. , Disp: , Rfl:    fluticasone (FLONASE) 50 MCG/ACT nasal spray, Place 1 spray into both nostrils daily., Disp: , Rfl:    gabapentin  (NEURONTIN ) 100 MG capsule, Take 1 capsule (100 mg total) by mouth at bedtime., Disp: 90 capsule, Rfl: 3   glimepiride  (AMARYL ) 2 MG tablet, Take 1 tablet (2 mg total) by mouth daily with breakfast., Disp: 90 tablet, Rfl: 4   icosapent  Ethyl (VASCEPA ) 1 g capsule, Take 2 capsules (2 g total) by mouth 2 (two) times daily. (Patient not taking: Reported on 04/12/2024), Disp: 120 capsule, Rfl: 11   insulin  aspart (NOVOLOG  FLEXPEN) 100 UNIT/ML FlexPen, Inject 4-5 units into skin 3 times daily before meals., Disp: 15 mL, Rfl: 4   insulin  degludec (TRESIBA  FLEXTOUCH) 100 UNIT/ML FlexTouch Pen, Inject once daily, adjust as directed with max dose 25 units, Disp: 6 mL, Rfl: 0   Insulin  Pen Needle (B-D UF III MINI PEN NEEDLES) 31G X 5 MM MISC, USE WITH INSULIN  PEN/uses 3 to 4 a day, Disp: 100 each, Rfl: 5   JARDIANCE  25 MG TABS tablet, Take 1 tablet by mouth once daily, Disp: 90 tablet, Rfl: 0   lisinopril (PRINIVIL,ZESTRIL) 5 MG tablet, Take 5 mg by mouth daily., Disp: , Rfl:    metFORMIN  (GLUCOPHAGE -XR) 500  MG 24 hr tablet, TAKE 2 TABLETS BY MOUTH TWICE DAILY WITH A MEAL, Disp: 360 tablet, Rfl: 3   nitroGLYCERIN  (NITROSTAT ) 0.4 MG SL tablet, Place 1 tablet (0.4 mg total) under the tongue every 5 (five) minutes as needed., Disp: 25 tablet, Rfl: 2   ticagrelor  (BRILINTA ) 90 MG TABS tablet, Take 1 tablet (90 mg total) by mouth 2 (two) times daily., Disp: 180 tablet, Rfl: 3  Past Medical History: Past Medical History:  Diagnosis Date   Diabetes (HCC)    Dyslipidemia     Tobacco Use: Social History   Tobacco Use  Smoking Status Former  Smokeless Tobacco Never    Labs: Review Flowsheet  More data exists      Latest Ref Rng & Units 07/15/2023 09/29/2023 12/02/2023 12/25/2023 03/29/2024  Labs for ITP Cardiac and Pulmonary Rehab  Cholestrol 0 - 200 mg/dL - - 086  - -  LDL (calc) 0 - 99 mg/dL - - 65  - -  HDL-C >57 mg/dL - - 31  - -  Trlycerides <150 mg/dL - - 846  - -  Hemoglobin A1c <5.7 % of total Hgb 7.9  7.5  - 7.9  8.0     Capillary Blood Glucose: Lab Results  Component Value Date   GLUCAP 256 (H) 02/02/2024   GLUCAP 287 (H) 02/02/2024   GLUCAP 124 (H) 12/02/2023   GLUCAP 140 (H) 12/02/2023  GLUCAP 136 (H) 11/25/2023     Exercise Target Goals: Exercise Program Goal: Individual exercise prescription set using results from initial 6 min walk test and THRR while considering  patient's activity barriers and safety.   Exercise Prescription Goal: Initial exercise prescription builds to 30-45 minutes a day of aerobic activity, 2-3 days per week.  Home exercise guidelines will be given to patient during program as part of exercise prescription that the participant will acknowledge.  Activity Barriers & Risk Stratification:  Activity Barriers & Cardiac Risk Stratification - 01/28/24 1553       Activity Barriers & Cardiac Risk Stratification   Activity Barriers None    Cardiac Risk Stratification High             6 Minute Walk:  6 Minute Walk     Row Name 01/28/24 1543  04/12/24 1042       6 Minute Walk   Phase Initial Discharge    Distance 1080 feet 1772 feet    Distance % Change -- 64.07 %    Distance Feet Change -- 692 ft    Walk Time 6 minutes 6 minutes    # of Rest Breaks 0 0    MPH 2.04 3.36    METS 2.4 3.67    RPE 11 9    Perceived Dyspnea  0 0    VO2 Peak 8.42 12.85    Symptoms No No    Resting HR 65 bpm 77 bpm    Resting BP 138/54 112/58    Resting Oxygen Saturation  96 % --    Max Ex. HR 102 bpm 100 bpm    Max Ex. BP 154/50 142/52    2 Minute Post BP 130/50 102/50             Oxygen Initial Assessment:   Oxygen Re-Evaluation:   Oxygen Discharge (Final Oxygen Re-Evaluation):   Initial Exercise Prescription:  Initial Exercise Prescription - 01/28/24 1500       Date of Initial Exercise RX and Referring Provider   Date 01/28/24    Referring Provider Dorothye Gathers    Expected Discharge Date 05/21/24      Treadmill   MPH 2    Grade 0    Minutes 15    METs 2.5      Bike   Level 1.5    Watts 15    Minutes 15    METs 2.6      Prescription Details   Frequency (times per week) 3days/week    Duration Progress to 30 minutes of continuous aerobic without signs/symptoms of physical distress      Intensity   THRR 40-80% of Max Heartrate 58-117    Ratings of Perceived Exertion 11-13    Perceived Dyspnea 0-4      Progression   Progression Continue progressive overload as per policy without signs/symptoms or physical distress.      Resistance Training   Training Prescription Yes    Weight 4lbs    Reps 10-15             Perform Capillary Blood Glucose checks as needed.  Exercise Prescription Changes:   Exercise Prescription Changes     Row Name 02/02/24 1027 02/09/24 1031 02/20/24 1027 03/15/24 1200 03/17/24 1024     Response to Exercise   Blood Pressure (Admit) 118/64 122/60 130/56 120/56 110/58   Blood Pressure (Exercise) 152/68 148/62 148/62 -- 130/80   Blood Pressure (Exit) 122/60 104/60 126/71  102/58 94/60  Heart Rate (Admit) 82 bpm 73 bpm 58 bpm 81 bpm 78 bpm   Heart Rate (Exercise) 118 bpm 126 bpm 116 bpm 131 bpm 123 bpm   Heart Rate (Exit) 91 bpm 96 bpm 85 bpm 96 bpm 87 bpm   Rating of Perceived Exertion (Exercise) 11 11 10 9 10    Symptoms None None None None None   Comments Off to a good start with exercise. Increased workload on bike. -- Reviewed Goals Reviewed home exercise guidelines with Marylou Sobers.   Duration Continue with 30 min of aerobic exercise without signs/symptoms of physical distress. Continue with 30 min of aerobic exercise without signs/symptoms of physical distress. Continue with 30 min of aerobic exercise without signs/symptoms of physical distress. Continue with 30 min of aerobic exercise without signs/symptoms of physical distress. Continue with 30 min of aerobic exercise without signs/symptoms of physical distress.   Intensity THRR unchanged THRR unchanged THRR unchanged THRR unchanged THRR unchanged     Progression   Progression Continue to progress workloads to maintain intensity without signs/symptoms of physical distress. Continue to progress workloads to maintain intensity without signs/symptoms of physical distress. Continue to progress workloads to maintain intensity without signs/symptoms of physical distress. Continue to progress workloads to maintain intensity without signs/symptoms of physical distress. Continue to progress workloads to maintain intensity without signs/symptoms of physical distress.   Average METs 2.7 4 3.9 4.4 4.2     Resistance Training   Training Prescription Yes Yes Yes Yes No   Weight 4lbs 4lbs 4lbs 4lbs Relaxation day, no weights.   Reps 10-15 10-15 10-15 10-15 --   Time 10 Minutes 10 Minutes 10 Minutes 10 Minutes --     Interval Training   Interval Training No No No -- --     Treadmill   MPH 2.5 2.9 3.3 3.3 3.3   Grade 0 1 1.5 1.5 1.5   Minutes 15 15 15 15 15    METs 2.91 3.62 4.21 4.21 4.21     Bike   Level 1.5 2.5 3.5  3.5 3.5   Watts 18 43 26 36 28   Minutes 15 15 15 15 15    METs 2.5 4.4 3.7 4.5 4.2     Home Exercise Plan   Plans to continue exercise at -- -- -- -- Home (comment)  Walking   Frequency -- -- -- -- Add 2 additional days to program exercise sessions.   Initial Home Exercises Provided -- -- -- -- 03/17/24    Row Name 03/22/24 1016 04/05/24 1027           Response to Exercise   Blood Pressure (Admit) 120/52 126/62      Blood Pressure (Exit) 110/58 100/52      Heart Rate (Admit) 70 bpm 79 bpm      Heart Rate (Exercise) 112 bpm 139 bpm      Heart Rate (Exit) 79 bpm 88 bpm      Rating of Perceived Exertion (Exercise) 11 8      Symptoms None None      Duration Continue with 30 min of aerobic exercise without signs/symptoms of physical distress. Continue with 30 min of aerobic exercise without signs/symptoms of physical distress.      Intensity THRR unchanged THRR unchanged        Progression   Progression Continue to progress workloads to maintain intensity without signs/symptoms of physical distress. Continue to progress workloads to maintain intensity without signs/symptoms of physical distress.      Average METs  3.9 3.5        Resistance Training   Training Prescription Yes Yes      Weight 6 lb wts 6 lb wts      Reps 10-15 10-15      Time 10 Minutes 10 Minutes        Interval Training   Interval Training No No        Treadmill   MPH 3.1 3      Grade 1.5 2      Minutes 15 15      METs 4.01 4.12        Recumbant Bike   Level 4 4      RPM 54 57      Watts 52 55      Minutes 15 15      METs 3.8 2.91        Home Exercise Plan   Plans to continue exercise at Home (comment)  Walking Home (comment)  Walking      Frequency Add 2 additional days to program exercise sessions. Add 2 additional days to program exercise sessions.      Initial Home Exercises Provided 03/17/24 03/17/24               Exercise Comments:   Exercise Comments     Row Name 01/28/24 1547  02/02/24 1132 03/15/24 1211 03/17/24 1052 04/07/24 1007   Exercise Comments Orientation, Pt performed w/o unusual s/s Marylou Sobers tolerated first session of exercise well without symptoms. Oriented him to the exercise and stretching routine. Reviewed goals with patient. Prior to missing three weeks of CR d/t being out sick Moyses reported that his self-confidence and fitness was improving. Gavan also started walking on Tuesday's and Thursday to continue his exercise. Unfortuantely Jameis became sick missed some time but he is looking forward to slowly progressing and getting back into routine. Will continue to watch. Reviewed home exercise guidelines with Tavarius. Reviewed METs with Marylou Sobers.            Exercise Goals and Review:   Exercise Goals     Row Name 01/28/24 1548             Exercise Goals   Increase Physical Activity Yes       Intervention Provide advice, education, support and counseling about physical activity/exercise needs.;Develop an individualized exercise prescription for aerobic and resistive training based on initial evaluation findings, risk stratification, comorbidities and participant's personal goals.       Expected Outcomes Short Term: Attend rehab on a regular basis to increase amount of physical activity.;Long Term: Exercising regularly at least 3-5 days a week.;Long Term: Add in home exercise to make exercise part of routine and to increase amount of physical activity.       Increase Strength and Stamina Yes       Intervention Provide advice, education, support and counseling about physical activity/exercise needs.;Develop an individualized exercise prescription for aerobic and resistive training based on initial evaluation findings, risk stratification, comorbidities and participant's personal goals.       Expected Outcomes Short Term: Increase workloads from initial exercise prescription for resistance, speed, and METs.;Long Term: Improve cardiorespiratory fitness, muscular  endurance and strength as measured by increased METs and functional capacity ( );Short Term: Perform resistance training exercises routinely during rehab and add in resistance training at home       Able to understand and use rate of perceived exertion (RPE) scale Yes       Intervention  Provide education and explanation on how to use RPE scale       Expected Outcomes Short Term: Able to use RPE daily in rehab to express subjective intensity level;Long Term:  Able to use RPE to guide intensity level when exercising independently       Knowledge and understanding of Target Heart Rate Range (THRR) Yes       Intervention Provide education and explanation of THRR including how the numbers were predicted and where they are located for reference       Expected Outcomes Short Term: Able to state/look up THRR;Long Term: Able to use THRR to govern intensity when exercising independently;Short Term: Able to use daily as guideline for intensity in rehab       Understanding of Exercise Prescription Yes       Intervention Provide education, explanation, and written materials on patient's individual exercise prescription       Expected Outcomes Short Term: Able to explain program exercise prescription;Long Term: Able to explain home exercise prescription to exercise independently                Exercise Goals Re-Evaluation :  Exercise Goals Re-Evaluation     Row Name 02/02/24 1132 03/15/24 1221 03/17/24 1052 04/12/24 1052       Exercise Goal Re-Evaluation   Exercise Goals Review Increase Physical Activity;Increase Strength and Stamina;Able to understand and use rate of perceived exertion (RPE) scale Increase Physical Activity;Increase Strength and Stamina;Able to understand and use rate of perceived exertion (RPE) scale;Knowledge and understanding of Target Heart Rate Range (THRR);Understanding of Exercise Prescription Increase Physical Activity;Increase Strength and Stamina;Able to understand and use  rate of perceived exertion (RPE) scale;Knowledge and understanding of Target Heart Rate Range (THRR);Understanding of Exercise Prescription Increase Physical Activity;Increase Strength and Stamina;Able to understand and use rate of perceived exertion (RPE) scale;Knowledge and understanding of Target Heart Rate Range (THRR);Understanding of Exercise Prescription    Comments Ashutosh was able to understand and use RPE scale appropriately. Reviewed goals with patient. Prior to missing three weeks of CR d/t being out sick Harkirat reported that his self-confidence and fitness was improving. Edy also started walking on Tuesday's and Thursday to continue his exercise. Unfortuantely Daksh became sick missed some time but he is looking forward to slowly progressing and getting back into routine. Will continue to watch. Reviewed exercise prescription with Marylou Sobers. Mrk will complete the cardiac rehab program next week. His functional capacity increased 64% as meausred by the .    Expected Outcomes Progress workloads as tolerated to help increase strength and stamina. Ferd will get back into his exercise routine. Sagan will walk at least 12-15 minutes 2 days/week in addition to exercise at cardiac rehab. Meldrick will continue walking at home upon completion of the cardiac rehab program.             Discharge Exercise Prescription (Final Exercise Prescription Changes):  Exercise Prescription Changes - 04/05/24 1027       Response to Exercise   Blood Pressure (Admit) 126/62    Blood Pressure (Exit) 100/52    Heart Rate (Admit) 79 bpm    Heart Rate (Exercise) 139 bpm    Heart Rate (Exit) 88 bpm    Rating of Perceived Exertion (Exercise) 8    Symptoms None    Duration Continue with 30 min of aerobic exercise without signs/symptoms of physical distress.    Intensity THRR unchanged      Progression   Progression Continue to progress workloads to  maintain intensity without signs/symptoms of physical distress.     Average METs 3.5      Resistance Training   Training Prescription Yes    Weight 6 lb wts    Reps 10-15    Time 10 Minutes      Interval Training   Interval Training No      Treadmill   MPH 3    Grade 2    Minutes 15    METs 4.12      Recumbant Bike   Level 4    RPM 57    Watts 55    Minutes 15    METs 2.91      Home Exercise Plan   Plans to continue exercise at Home (comment)   Walking   Frequency Add 2 additional days to program exercise sessions.    Initial Home Exercises Provided 03/17/24             Nutrition:  Target Goals: Understanding of nutrition guidelines, daily intake of sodium 1500mg , cholesterol 200mg , calories 30% from fat and 7% or less from saturated fats, daily to have 5 or more servings of fruits and vegetables.  Biometrics:  Pre Biometrics - 01/28/24 1547       Pre Biometrics   Height 5\' 11"  (1.803 m)    Weight 88 kg    Waist Circumference 44 inches    Hip Circumference 42 inches    Waist to Hip Ratio 1.05 %    BMI (Calculated) 27.07    Triceps Skinfold 14 mm    % Body Fat 28.9 %    Grip Strength 42 kg    Flexibility 14 in    Single Leg Stand 7.97 seconds             Post Biometrics - 04/12/24 1052        Post  Biometrics   Waist Circumference 42.5 inches    Hip Circumference 40 inches    Waist to Hip Ratio 1.06 %    Triceps Skinfold 18 mm    Grip Strength 36 kg    Flexibility 14.75 in    Single Leg Stand 4.31 seconds             Nutrition Therapy Plan and Nutrition Goals:  Nutrition Therapy & Goals - 03/19/24 1022       Nutrition Therapy   Diet Heart healthy/carbohydrate consistent diet    Drug/Food Interactions Statins/Certain Fruits      Personal Nutrition Goals   Nutrition Goal Patient to identify strategies for reducing cardiovascular risk by attending the [facility] education and nutrition series weekly.   goal not met.   Personal Goal #2 Patient to improve diet quality by using the plate method as a  guide for meal planning to include lean protein/plant protein, fruits, vegetables, whole grains, nonfat dairy as part of a well-balanced diet.   goal in progress.   Comments Goals not met. Sasuke has medical history of s/p coronary artery stent placement, HTN, hyperlipidemia, DM2, OSA. Triglycerides remain elevated (vascepa ). LDL is at goal. A1c remains elevated above goal. Johathon has not regularly attended the [facility] education and nutrition series over the last 30 days. He is up 2.2# since starting with our program. Patient will continue to benefit from participation in intensive cardiac rehab for nutrition, exercise, and lifestyle modification.      Intervention Plan   Intervention Prescribe, educate and counsel regarding individualized specific dietary modifications aiming towards targeted core components such as weight, hypertension,  lipid management, diabetes, heart failure and other comorbidities.;Nutrition handout(s) given to patient.    Expected Outcomes Long Term Goal: Adherence to prescribed nutrition plan.;Short Term Goal: Understand basic principles of dietary content, such as calories, fat, sodium, cholesterol and nutrients.             Nutrition Assessments:  MEDIFICTS Score Key: >=70 Need to make dietary changes  40-70 Heart Healthy Diet <= 40 Therapeutic Level Cholesterol Diet    Picture Your Plate Scores: <78 Unhealthy dietary pattern with much room for improvement. 41-50 Dietary pattern unlikely to meet recommendations for good health and room for improvement. 51-60 More healthful dietary pattern, with some room for improvement.  >60 Healthy dietary pattern, although there may be some specific behaviors that could be improved.    Nutrition Goals Re-Evaluation:  Nutrition Goals Re-Evaluation     Row Name 02/19/24 1028 03/19/24 1022           Goals   Current Weight 194 lb 14.2 oz (88.4 kg) 191 lb 2.2 oz (86.7 kg)      Comment A1c 7.9, triglycerides 238, HDL 38, LDL  65 no new labs; most recent labs A1c 7.9, triglycerides 238, HDL 38, LDL 65      Expected Outcome Goals in progress. Aayden has medical history of s/p coronary artery stent placement, HTN, hyperlipidemia, DM2, OSA. Triglycerides remain elevated (vascepa ). LDL is at goal. A1c is not at goal. Deone does continue to attend the [facility] education and nutrition series regularly. He is up 6# since starting with our program. Patient will benefit from participation in intensive cardiac rehab for nutrition, exercise, and lifestyle modification. Goals not met. Ian has medical history of s/p coronary artery stent placement, HTN, hyperlipidemia, DM2, OSA. Triglycerides remain elevated (vascepa ). LDL is at goal. A1c remains elevated above goal. Dwayne has not regularly attended the [facility] education and nutrition series over the last 30 days. He is up 2.2# since starting with our program. Patient will continue to benefit from participation in intensive cardiac rehab for nutrition, exercise, and lifestyle modification.               Nutrition Goals Re-Evaluation:  Nutrition Goals Re-Evaluation     Row Name 02/19/24 1028 03/19/24 1022           Goals   Current Weight 194 lb 14.2 oz (88.4 kg) 191 lb 2.2 oz (86.7 kg)      Comment A1c 7.9, triglycerides 238, HDL 38, LDL 65 no new labs; most recent labs A1c 7.9, triglycerides 238, HDL 38, LDL 65      Expected Outcome Goals in progress. Marcus has medical history of s/p coronary artery stent placement, HTN, hyperlipidemia, DM2, OSA. Triglycerides remain elevated (vascepa ). LDL is at goal. A1c is not at goal. Roddie does continue to attend the [facility] education and nutrition series regularly. He is up 6# since starting with our program. Patient will benefit from participation in intensive cardiac rehab for nutrition, exercise, and lifestyle modification. Goals not met. Soloman has medical history of s/p coronary artery stent placement, HTN, hyperlipidemia, DM2, OSA.  Triglycerides remain elevated (vascepa ). LDL is at goal. A1c remains elevated above goal. Candon has not regularly attended the [facility] education and nutrition series over the last 30 days. He is up 2.2# since starting with our program. Patient will continue to benefit from participation in intensive cardiac rehab for nutrition, exercise, and lifestyle modification.               Nutrition  Goals Discharge (Final Nutrition Goals Re-Evaluation):  Nutrition Goals Re-Evaluation - 03/19/24 1022       Goals   Current Weight 191 lb 2.2 oz (86.7 kg)    Comment no new labs; most recent labs A1c 7.9, triglycerides 238, HDL 38, LDL 65    Expected Outcome Goals not met. Jadarion has medical history of s/p coronary artery stent placement, HTN, hyperlipidemia, DM2, OSA. Triglycerides remain elevated (vascepa ). LDL is at goal. A1c remains elevated above goal. Makoto has not regularly attended the [facility] education and nutrition series over the last 30 days. He is up 2.2# since starting with our program. Patient will continue to benefit from participation in intensive cardiac rehab for nutrition, exercise, and lifestyle modification.             Psychosocial: Target Goals: Acknowledge presence or absence of significant depression and/or stress, maximize coping skills, provide positive support system. Participant is able to verbalize types and ability to use techniques and skills needed for reducing stress and depression.  Initial Review & Psychosocial Screening:   Quality of Life Scores:  Quality of Life - 04/12/24 1637       Quality of Life   Select Quality of Life      Quality of Life Scores   Health/Function Pre 24.3 %    Health/Function Post 23.5 %    Health/Function % Change -3.29 %    Socioeconomic Pre 25.25 %    Socioeconomic Post 23.57 %    Socioeconomic % Change  -6.65 %    Psych/Spiritual Pre 23.71 %    Psych/Spiritual Post 24 %    Psych/Spiritual % Change 1.22 %    Family Pre 30 %     Family Post 26.4 %    Family % Change -12 %    GLOBAL Pre 25.21 %    GLOBAL Post 24.04 %    GLOBAL % Change -4.64 %            Scores of 19 and below usually indicate a poorer quality of life in these areas.  A difference of  2-3 points is a clinically meaningful difference.  A difference of 2-3 points in the total score of the Quality of Life Index has been associated with significant improvement in overall quality of life, self-image, physical symptoms, and general health in studies assessing change in quality of life.  PHQ-9: Review Flowsheet       02/02/2024 01/28/2024 05/13/2022 11/07/2016  Depression screen PHQ 2/9  Decreased Interest 0 3 0 0 0  Down, Depressed, Hopeless 0 1 0 0 1  PHQ - 2 Score 0 4 0 0 1  Altered sleeping 0 1 - -  Tired, decreased energy 1 1 - -  Change in appetite 0 0 - -  Feeling bad or failure about yourself  0 0 - -  Trouble concentrating 0 0 - -  Moving slowly or fidgety/restless 0 1 - -  Suicidal thoughts 0 0 - -  PHQ-9 Score 1 7 - -  Difficult doing work/chores Not difficult at all Not difficult at all - -    Details       Multiple values from one day are sorted in reverse-chronological order        Interpretation of Total Score  Total Score Depression Severity:  1-4 = Minimal depression, 5-9 = Mild depression, 10-14 = Moderate depression, 15-19 = Moderately severe depression, 20-27 = Severe depression   Psychosocial Evaluation and Intervention:   Psychosocial  Re-Evaluation:  Psychosocial Re-Evaluation     Row Name 02/03/24 1347 02/23/24 0956 03/23/24 0802 04/09/24 0944       Psychosocial Re-Evaluation   Current issues with None Identified None Identified None Identified None Identified    Comments -- -- Keithen was absent due to cold symptoms and has returned without concerns --    Interventions Encouraged to attend Cardiac Rehabilitation for the exercise Encouraged to attend Cardiac Rehabilitation for the exercise Encouraged to  attend Cardiac Rehabilitation for the exercise Encouraged to attend Cardiac Rehabilitation for the exercise    Continue Psychosocial Services  No Follow up required No Follow up required No Follow up required No Follow up required             Psychosocial Discharge (Final Psychosocial Re-Evaluation):  Psychosocial Re-Evaluation - 04/09/24 0944       Psychosocial Re-Evaluation   Current issues with None Identified    Interventions Encouraged to attend Cardiac Rehabilitation for the exercise    Continue Psychosocial Services  No Follow up required             Vocational Rehabilitation: Provide vocational rehab assistance to qualifying candidates.   Vocational Rehab Evaluation & Intervention:  Vocational Rehab - 01/28/24 1542       Initial Vocational Rehab Evaluation & Intervention   Assessment shows need for Vocational Rehabilitation No             Education: Education Goals: Education classes will be provided on a weekly basis, covering required topics. Participant will state understanding/return demonstration of topics presented.    Education     Row Name 02/02/24 1100     Education   Cardiac Education Topics Pritikin   Select Workshops     Workshops   Educator Exercise Physiologist   Select Exercise   Exercise Workshop Location manager and Fall Prevention   Instruction Review Code 1- Verbalizes Understanding   Class Start Time 1142   Class Stop Time 1224   Class Time Calculation (min) 42 min    Row Name 02/04/24 1000     Education   Cardiac Education Topics Pritikin   Set designer Exercise Physiologist   Weekly Topic Adding Flavor - Sodium-Free   Instruction Review Code 1- Verbalizes Understanding   Class Start Time 1145   Class Stop Time 1225   Class Time Calculation (min) 40 min    Row Name 02/06/24 1500     Education   Cardiac Education Topics Pritikin   Geologist, engineering Nutrition   Nutrition Overview of the Pritikin Eating Plan   Instruction Review Code 1- Verbalizes Understanding   Class Start Time 1145   Class Stop Time 1238   Class Time Calculation (min) 53 min    Row Name 02/09/24 1100     Education   Cardiac Education Topics Pritikin   Nurse, children's Exercise Physiologist   Select Psychosocial   Psychosocial Healthy Minds, Bodies, Hearts   Instruction Review Code 1- Verbalizes Understanding   Class Start Time 1155   Class Stop Time 1240   Class Time Calculation (min) 45 min    Row Name 02/11/24 1200     Education   Cardiac Education Topics Pritikin   Dentist  Weekly Topic Fast and Healthy Breakfasts   Instruction Review Code 1- Verbalizes Understanding   Class Start Time 1143   Class Stop Time 1215   Class Time Calculation (min) 32 min    Row Name 03/15/24 1100     Education   Cardiac Education Topics Pritikin   [employer] Workshops     Workshops   Educator --   Physiological scientist --   Psychosocial Workshop --   Instruction Review Code --   Class Start Time --   Class Stop Time --   Class Time Calculation (min) --    Row Name 03/19/24 1100     Education   Cardiac Education Topics Pritikin   Nurse, children's Dietitian   Nutrition Dining Out - Part 1   Instruction Review Code 1- Verbalizes Understanding   Class Start Time 1145   Class Stop Time 1222   Class Time Calculation (min) 37 min    Row Name 03/26/24 1200     Education   Cardiac Education Topics Pritikin   Psychologist, sport and exercise     Core Videos   Educator Dietitian   Nutrition Vitamins and Minerals   Instruction Review Code 1- Verbalizes Understanding   Class Start Time 1145            Core Videos: Exercise    Move It!  Clinical staff conducted group or individual video education with verbal and written material and  guidebook.  Patient learns the recommended Pritikin exercise program. Exercise with the goal of living a long, healthy life. Some of the health benefits of exercise include controlled diabetes, healthier blood pressure levels, improved cholesterol levels, improved heart and lung capacity, improved sleep, and better body composition. Everyone should speak with their doctor before starting or changing an exercise routine.  Biomechanical Limitations Clinical staff conducted group or individual video education with verbal and written material and guidebook.  Patient learns how biomechanical limitations can impact exercise and how we can mitigate and possibly overcome limitations to have an impactful and balanced exercise routine.  Body Composition Clinical staff conducted group or individual video education with verbal and written material and guidebook.  Patient learns that body composition (ratio of muscle mass to fat mass) is a key component to assessing overall fitness, rather than body weight alone. Increased fat mass, especially visceral belly fat, can put us  at increased risk for metabolic syndrome, type 2 diabetes, heart disease, and even death. It is recommended to combine diet and exercise (cardiovascular and resistance training) to improve your body composition. Seek guidance from your physician and exercise physiologist before implementing an exercise routine.  Exercise Action Plan Clinical staff conducted group or individual video education with verbal and written material and guidebook.  Patient learns the recommended strategies to achieve and enjoy long-term exercise adherence, including variety, self-motivation, self-efficacy, and positive decision making. Benefits of exercise include fitness, good health, weight management, more energy, better sleep, less stress, and overall well-being.  Medical   Heart Disease Risk Reduction Clinical staff conducted group or individual video education  with verbal and written material and guidebook.  Patient learns our heart is our most vital organ as it circulates oxygen, nutrients, white blood cells, and hormones throughout the entire body, and carries waste away. Data supports a plant-based eating plan like the Pritikin Program for its effectiveness in slowing progression of and reversing heart disease. The video provides a number of recommendations to address heart disease.  Metabolic Syndrome and Belly Fat  Clinical staff conducted group or individual video education with verbal and written material and guidebook.  Patient learns what metabolic syndrome is, how it leads to heart disease, and how one can reverse it and keep it from coming back. You have metabolic syndrome if you have 3 of the following 5 criteria: abdominal obesity, high blood pressure, high triglycerides, low HDL cholesterol, and high blood sugar.  Hypertension and Heart Disease Clinical staff conducted group or individual video education with verbal and written material and guidebook.  Patient learns that high blood pressure, or hypertension, is very common in the [place] . Hypertension is largely due to excessive salt intake, but other important risk factors include being overweight, physical inactivity, drinking too much alcohol, smoking, and not eating enough potassium from fruits and vegetables. High blood pressure is a leading risk factor for heart attack, stroke, congestive heart failure, dementia, kidney failure, and premature death. Long-term effects of excessive salt intake include stiffening of the arteries and thickening of heart muscle and organ damage. Recommendations include ways to reduce hypertension and the risk of heart disease.  Diseases of Our Time - Focusing on Diabetes Clinical staff conducted group or individual video education with verbal and written material and guidebook.  Patient learns why the best way to stop diseases of our time is prevention,  through food and other lifestyle changes. Medicine (such as prescription pills and surgeries) is often only a Band-Aid on the problem, not a long-term solution. Most common diseases of our time include obesity, type 2 diabetes, hypertension, heart disease, and cancer. The Pritikin Program is recommended and has been proven to help reduce, reverse, and/or prevent the damaging effects of metabolic syndrome.  Nutrition   Overview of the Pritikin Eating Plan  Clinical staff conducted group or individual video education with verbal and written material and guidebook.  Patient learns about the Pritikin Eating Plan for disease risk reduction. The Pritikin Eating Plan emphasizes a wide variety of unrefined, minimally-processed carbohydrates, like fruits, vegetables, whole grains, and legumes. Go, Caution, and Stop food choices are explained. Plant-based and lean animal proteins are emphasized. Rationale provided for low sodium intake for blood pressure control, low added sugars for blood sugar stabilization, and low added fats and oils for coronary artery disease risk reduction and weight management.  Calorie Density  Clinical staff conducted group or individual video education with verbal and written material and guidebook.  Patient learns about calorie density and how it impacts the Pritikin Eating Plan. Knowing the characteristics of the food you choose will help you decide whether those foods will lead to weight gain or weight loss, and whether you want to consume more or less of them. Weight loss is usually a side effect of the Pritikin Eating Plan because of its focus on low calorie-dense foods.  Label Reading  Clinical staff conducted group or individual video education with verbal and written material and guidebook.  Patient learns about the Pritikin recommended label reading guidelines and corresponding recommendations regarding calorie density, added sugars, sodium content, and whole grains.  Dining  Out - Part 1  Clinical staff conducted group or individual video education with verbal and written material and guidebook.  Patient learns that restaurant meals can be sabotaging because they can be so high in calories, fat, sodium, and/or sugar. Patient learns recommended strategies on how to positively address this and avoid unhealthy pitfalls.  Facts on Fats  Clinical staff conducted group or individual video education with  verbal and written material and guidebook.  Patient learns that lifestyle modifications can be just as effective, if not more so, as many medications for lowering your risk of heart disease. A Pritikin lifestyle can help to reduce your risk of inflammation and atherosclerosis (cholesterol build-up, or plaque, in the artery walls). Lifestyle interventions such as dietary choices and physical activity address the cause of atherosclerosis. A review of the types of fats and their impact on blood cholesterol levels, along with dietary recommendations to reduce fat intake is also included.  Nutrition Action Plan  Clinical staff conducted group or individual video education with verbal and written material and guidebook.  Patient learns how to incorporate Pritikin recommendations into their lifestyle. Recommendations include planning and keeping personal health goals in mind as an important part of their success.  Healthy Mind-Set    Healthy Minds, Bodies, Hearts  Clinical staff conducted group or individual video education with verbal and written material and guidebook.  Patient learns how to identify when they are stressed. Video will discuss the impact of that stress, as well as the many benefits of stress management. Patient will also be introduced to stress management techniques. The way we think, act, and feel has an impact on our hearts.  How Our Thoughts Can Heal Our Hearts  Clinical staff conducted group or individual video education with verbal and written material and  guidebook.  Patient learns that negative thoughts can cause depression and anxiety. This can result in negative lifestyle behavior and serious health problems. Cognitive behavioral therapy is an effective method to help control our thoughts in order to change and improve our emotional outlook.  Additional Videos:  Exercise    Improving Performance  Clinical staff conducted group or individual video education with verbal and written material and guidebook.  Patient learns to use a non-linear approach by alternating intensity levels and lengths of time spent exercising to help burn more calories and lose more body fat. Cardiovascular exercise helps improve heart health, metabolism, hormonal balance, blood sugar control, and recovery from fatigue. Resistance training improves strength, endurance, balance, coordination, reaction time, metabolism, and muscle mass. Flexibility exercise improves circulation, posture, and balance. Seek guidance from your physician and exercise physiologist before implementing an exercise routine and learn your capabilities and proper form for all exercise.  Introduction to Yoga  Clinical staff conducted group or individual video education with verbal and written material and guidebook.  Patient learns about yoga, a discipline of the coming together of mind, breath, and body. The benefits of yoga include improved flexibility, improved range of motion, better posture and core strength, increased lung function, weight loss, and positive self-image. Yoga's heart health benefits include lowered blood pressure, healthier heart rate, decreased cholesterol and triglyceride levels, improved immune function, and reduced stress. Seek guidance from your physician and exercise physiologist before implementing an exercise routine and learn your capabilities and proper form for all exercise.  Medical   Aging: Enhancing Your Quality of Life  Clinical staff conducted group or individual  video education with verbal and written material and guidebook.  Patient learns key strategies and recommendations to stay in good physical health and enhance quality of life, such as prevention strategies, having an advocate, securing a Health Care Proxy and Power of Attorney, and keeping a list of medications and system for tracking them. It also discusses how to avoid risk for bone loss.  Biology of Weight Control  Clinical staff conducted group or individual video education with verbal and written  material and guidebook.  Patient learns that weight gain occurs because we consume more calories than we burn (eating more, moving less). Even if your body weight is normal, you may have higher ratios of fat compared to muscle mass. Too much body fat puts you at increased risk for cardiovascular disease, heart attack, stroke, type 2 diabetes, and obesity-related cancers. In addition to exercise, following the Pritikin Eating Plan can help reduce your risk.  Decoding Lab Results  Clinical staff conducted group or individual video education with verbal and written material and guidebook.  Patient learns that lab test reflects one measurement whose values change over time and are influenced by many factors, including medication, stress, sleep, exercise, food, hydration, pre-existing medical conditions, and more. It is recommended to use the knowledge from this video to become more involved with your lab results and evaluate your numbers to speak with your doctor.   Diseases of Our Time - Overview  Clinical staff conducted group or individual video education with verbal and written material and guidebook.  Patient learns that according to the CDC, 50% to 70% of chronic diseases (such as obesity, type 2 diabetes, elevated lipids, hypertension, and heart disease) are avoidable through lifestyle improvements including healthier food choices, listening to satiety cues, and increased physical activity.  Sleep  Disorders Clinical staff conducted group or individual video education with verbal and written material and guidebook.  Patient learns how good quality and duration of sleep are important to overall health and well-being. Patient also learns about sleep disorders and how they impact health along with recommendations to address them, including discussing with a physician.  Nutrition  Dining Out - Part 2 Clinical staff conducted group or individual video education with verbal and written material and guidebook.  Patient learns how to plan ahead and communicate in order to maximize their dining experience in a healthy and nutritious manner. Included are recommended food choices based on the type of restaurant the patient is visiting.   Fueling a Banker conducted group or individual video education with verbal and written material and guidebook.  There is a strong connection between our food choices and our health. Diseases like obesity and type 2 diabetes are very prevalent and are in large-part due to lifestyle choices. The Pritikin Eating Plan provides plenty of food and hunger-curbing satisfaction. It is easy to follow, affordable, and helps reduce health risks.  Menu Workshop  Clinical staff conducted group or individual video education with verbal and written material and guidebook.  Patient learns that restaurant meals can sabotage health goals because they are often packed with calories, fat, sodium, and sugar. Recommendations include strategies to plan ahead and to communicate with the manager, chef, or server to help order a healthier meal.  Planning Your Eating Strategy  Clinical staff conducted group or individual video education with verbal and written material and guidebook.  Patient learns about the Pritikin Eating Plan and its benefit of reducing the risk of disease. The Pritikin Eating Plan does not focus on calories. Instead, it emphasizes high-quality,  nutrient-rich foods. By knowing the characteristics of the foods, we choose, we can determine their calorie density and make informed decisions.  Targeting Your Nutrition Priorities  Clinical staff conducted group or individual video education with verbal and written material and guidebook.  Patient learns that lifestyle habits have a tremendous impact on disease risk and progression. This video provides eating and physical activity recommendations based on your personal health goals, such as  reducing LDL cholesterol, losing weight, preventing or controlling type 2 diabetes, and reducing high blood pressure.  Vitamins and Minerals  Clinical staff conducted group or individual video education with verbal and written material and guidebook.  Patient learns different ways to obtain key vitamins and minerals, including through a recommended healthy diet. It is important to discuss all supplements you take with your doctor.   Healthy Mind-Set    Smoking Cessation  Clinical staff conducted group or individual video education with verbal and written material and guidebook.  Patient learns that cigarette smoking and tobacco addiction pose a serious health risk which affects millions of people. Stopping smoking will significantly reduce the risk of heart disease, lung disease, and many forms of cancer. Recommended strategies for quitting are covered, including working with your doctor to develop a successful plan.  Culinary   Becoming a Set designer conducted group or individual video education with verbal and written material and guidebook.  Patient learns that cooking at home can be healthy, cost-effective, quick, and puts them in control. Keys to cooking healthy recipes will include looking at your recipe, assessing your equipment needs, planning ahead, making it simple, choosing cost-effective seasonal ingredients, and limiting the use of added fats, salts, and sugars.  Cooking -  Breakfast and Snacks  Clinical staff conducted group or individual video education with verbal and written material and guidebook.  Patient learns how important breakfast is to satiety and nutrition through the entire day. Recommendations include key foods to eat during breakfast to help stabilize blood sugar levels and to prevent overeating at meals later in the day. Planning ahead is also a key component.  Cooking - Educational psychologist conducted group or individual video education with verbal and written material and guidebook.  Patient learns eating strategies to improve overall health, including an approach to cook more at home. Recommendations include thinking of animal protein as a side on your plate rather than center stage and focusing instead on lower calorie dense options like vegetables, fruits, whole grains, and plant-based proteins, such as beans. Making sauces in large quantities to freeze for later and leaving the skin on your vegetables are also recommended to maximize your experience.  Cooking - Healthy Salads and Dressing Clinical staff conducted group or individual video education with verbal and written material and guidebook.  Patient learns that vegetables, fruits, whole grains, and legumes are the foundations of the Pritikin Eating Plan. Recommendations include how to incorporate each of these in flavorful and healthy salads, and how to create homemade salad dressings. Proper handling of ingredients is also covered. Cooking - Soups and [employer] - Soups and Desserts Clinical staff conducted group or individual video education with verbal and written material and guidebook.  Patient learns that Pritikin soups and desserts make for easy, nutritious, and delicious snacks and meal components that are low in sodium, fat, sugar, and calorie density, while high in vitamins, minerals, and filling fiber. Recommendations include simple and healthy ideas for soups and  desserts.   Overview     The Pritikin Solution Program Overview Clinical staff conducted group or individual video education with verbal and written material and guidebook.  Patient learns that the results of the Pritikin Program have been documented in more than 100 articles published in peer-reviewed journals, and the benefits include reducing risk factors for (and, in some cases, even reversing) high cholesterol, high blood pressure, type 2 diabetes, obesity, and more! An overview of  the three key pillars of the Pritikin Program will be covered: eating well, doing regular exercise, and having a healthy mind-set.  WORKSHOPS  Exercise: Exercise Basics: Building Your Action Plan Clinical staff led group instruction and group discussion with PowerPoint presentation and patient guidebook. To enhance the learning environment the use of posters, models and videos may be added. At the conclusion of this workshop, patients will comprehend the difference between physical activity and exercise, as well as the benefits of incorporating both, into their routine. Patients will understand the FITT (Frequency, Intensity, Time, and Type) principle and how to use it to build an exercise action plan. In addition, safety concerns and other considerations for exercise and cardiac rehab will be addressed by the presenter. The purpose of this lesson is to promote a comprehensive and effective weekly exercise routine in order to improve patients' overall level of fitness.   Managing Heart Disease: Your Path to a Healthier Heart Clinical staff led group instruction and group discussion with PowerPoint presentation and patient guidebook. To enhance the learning environment the use of posters, models and videos may be added.At the conclusion of this workshop, patients will understand the anatomy and physiology of the heart. Additionally, they will understand how Pritikin's three pillars impact the risk factors, the  progression, and the management of heart disease.  The purpose of this lesson is to provide a high-level overview of the heart, heart disease, and how the Pritikin lifestyle positively impacts risk factors.  Exercise Biomechanics Clinical staff led group instruction and group discussion with PowerPoint presentation and patient guidebook. To enhance the learning environment the use of posters, models and videos may be added. Patients will learn how the structural parts of their bodies function and how these functions impact their daily activities, movement, and exercise. Patients will learn how to promote a neutral spine, learn how to manage pain, and identify ways to improve their physical movement in order to promote healthy living. The purpose of this lesson is to expose patients to common physical limitations that impact physical activity. Participants will learn practical ways to adapt and manage aches and pains, and to minimize their effect on regular exercise. Patients will learn how to maintain good posture while sitting, walking, and lifting.  Balance Training and Fall Prevention  Clinical staff led group instruction and group discussion with PowerPoint presentation and patient guidebook. To enhance the learning environment the use of posters, models and videos may be added. At the conclusion of this workshop, patients will understand the importance of their sensorimotor skills (vision, proprioception, and the vestibular system) in maintaining their ability to balance as they age. Patients will apply a variety of balancing exercises that are appropriate for their current level of function. Patients will understand the common causes for poor balance, possible solutions to these problems, and ways to modify their physical environment in order to minimize their fall risk. The purpose of this lesson is to teach patients about the importance of maintaining balance as they age and ways to  minimize their risk of falling.  WORKSHOPS   Nutrition:  Fueling a Ship broker led group instruction and group discussion with PowerPoint presentation and patient guidebook. To enhance the learning environment the use of posters, models and videos may be added. Patients will review the foundational principles of the Pritikin Eating Plan and understand what constitutes a serving size in each of the food groups. Patients will also learn Pritikin-friendly foods that are better choices when away from home  and review make-ahead meal and snack options. Calorie density will be reviewed and applied to three nutrition priorities: weight maintenance, weight loss, and weight gain. The purpose of this lesson is to reinforce (in a group setting) the key concepts around what patients are recommended to eat and how to apply these guidelines when away from home by planning and selecting Pritikin-friendly options. Patients will understand how calorie density may be adjusted for different weight management goals.  Mindful Eating  Clinical staff led group instruction and group discussion with PowerPoint presentation and patient guidebook. To enhance the learning environment the use of posters, models and videos may be added. Patients will briefly review the concepts of the Pritikin Eating Plan and the importance of low-calorie dense foods. The concept of mindful eating will be introduced as well as the importance of paying attention to internal hunger signals. Triggers for non-hunger eating and techniques for dealing with triggers will be explored. The purpose of this lesson is to provide patients with the opportunity to review the basic principles of the Pritikin Eating Plan, discuss the value of eating mindfully and how to measure internal cues of hunger and fullness using the Hunger Scale. Patients will also discuss reasons for non-hunger eating and learn strategies to use for controlling emotional  eating.  Targeting Your Nutrition Priorities Clinical staff led group instruction and group discussion with PowerPoint presentation and patient guidebook. To enhance the learning environment the use of posters, models and videos may be added. Patients will learn how to determine their genetic susceptibility to disease by reviewing their family history. Patients will gain insight into the importance of diet as part of an overall healthy lifestyle in mitigating the impact of genetics and other environmental insults. The purpose of this lesson is to provide patients with the opportunity to assess their personal nutrition priorities by looking at their family history, their own health history and current risk factors. Patients will also be able to discuss ways of prioritizing and modifying the Pritikin Eating Plan for their highest risk areas  Menu  Clinical staff led group instruction and group discussion with PowerPoint presentation and patient guidebook. To enhance the learning environment the use of posters, models and videos may be added. Using menus brought in from [employer], or printed from [employer], patients will apply the Pritikin dining out guidelines that were presented in the [employer] video. Patients will also be able to practice these guidelines in a variety of provided scenarios. The purpose of this lesson is to provide patients with the opportunity to practice hands-on learning of the Pritikin Dining Out guidelines with actual menus and practice scenarios.  Label Reading Clinical staff led group instruction and group discussion with PowerPoint presentation and patient guidebook. To enhance the learning environment the use of posters, models and videos may be added. Patients will review and discuss the Pritikin label reading guidelines presented in Pritikin's Label Reading Educational series video. Using fool labels brought in from local grocery stores and  markets, patients will apply the label reading guidelines and determine if the packaged food meet the Pritikin guidelines. The purpose of this lesson is to provide patients with the opportunity to review, discuss, and practice hands-on learning of the Pritikin Label Reading guidelines with actual packaged food labels. Cooking School  Pritikin's [employer] are designed to teach patients ways to prepare quick, simple, and affordable recipes at home. The importance of nutrition's role in chronic disease risk reduction is reflected in its  emphasis in the overall Pritikin program. By learning how to prepare essential core Pritikin Eating Plan recipes, patients will increase control over what they eat; be able to customize the flavor of foods without the use of added salt, sugar, or fat; and improve the quality of the food they consume. By learning a set of core recipes which are easily assembled, quickly prepared, and affordable, patients are more likely to prepare more healthy foods at home. These workshops focus on convenient breakfasts, simple entres, side dishes, and desserts which can be prepared with minimal effort and are consistent with nutrition recommendations for cardiovascular risk reduction. Cooking [employer] are taught by a Armed forces logistics/support/administrative officer (RD) who has been trained by the [employer]. The chef or RD has a clear understanding of the importance of minimizing - if not completely eliminating - added fat, sugar, and sodium in recipes. Throughout the series of Cooking School Workshop sessions, patients will learn about healthy ingredients and efficient methods of cooking to build confidence in their capability to prepare    Cooking School weekly topics:  Adding Flavor- Sodium-Free  Fast and Healthy Breakfasts  Powerhouse Plant-Based Proteins  Satisfying Salads and Dressings  Simple Sides and Sauces  International Cuisine-Spotlight on the [employer]  Zones  Delicious Desserts  Savory Soups  [employer] - Meals in a Astronomer Appetizers and Snacks  Comforting Weekend Breakfasts  One-Pot Wonders   Fast Evening Meals  Landscape architect Your Pritikin Plate  WORKSHOPS   Healthy Mindset (Psychosocial):  Focused Goals, Sustainable Changes Clinical staff led group instruction and group discussion with PowerPoint presentation and patient guidebook. To enhance the learning environment the use of posters, models and videos may be added. Patients will be able to apply effective goal setting strategies to establish at least one personal goal, and then take consistent, meaningful action toward that goal. They will learn to identify common barriers to achieving personal goals and develop strategies to overcome them. Patients will also gain an understanding of how our mind-set can impact our ability to achieve goals and the importance of cultivating a positive and growth-oriented mind-set. The purpose of this lesson is to provide patients with a deeper understanding of how to set and achieve personal goals, as well as the tools and strategies needed to overcome common obstacles which may arise along the way.  From Head to Heart: The Power of a Healthy Outlook  Clinical staff led group instruction and group discussion with PowerPoint presentation and patient guidebook. To enhance the learning environment the use of posters, models and videos may be added. Patients will be able to recognize and describe the impact of emotions and mood on physical health. They will discover the importance of self-care and explore self-care practices which may work for them. Patients will also learn how to utilize the 4 C's to cultivate a healthier outlook and better manage stress and challenges. The purpose of this lesson is to demonstrate to patients how a healthy outlook is an essential part of maintaining good health, especially as they continue their  cardiac rehab journey.  Healthy Sleep for a Healthy Heart Clinical staff led group instruction and group discussion with PowerPoint presentation and patient guidebook. To enhance the learning environment the use of posters, models and videos may be added. At the conclusion of this workshop, patients will be able to demonstrate knowledge of the importance of sleep to overall health, well-being, and quality of life. They will understand the  symptoms of, and treatments for, common sleep disorders. Patients will also be able to identify daytime and nighttime behaviors which impact sleep, and they will be able to apply these tools to help manage sleep-related challenges. The purpose of this lesson is to provide patients with a general overview of sleep and outline the importance of quality sleep. Patients will learn about a few of the most common sleep disorders. Patients will also be introduced to the concept of "sleep hygiene," and discover ways to self-manage certain sleeping problems through simple daily behavior changes. Finally, the workshop will motivate patients by clarifying the links between quality sleep and their goals of heart-healthy living.   Recognizing and Reducing Stress Clinical staff led group instruction and group discussion with PowerPoint presentation and patient guidebook. To enhance the learning environment the use of posters, models and videos may be added. At the conclusion of this workshop, patients will be able to understand the types of stress reactions, differentiate between acute and chronic stress, and recognize the impact that chronic stress has on their health. They will also be able to apply different coping mechanisms, such as reframing negative self-talk. Patients will have the opportunity to practice a variety of stress management techniques, such as deep abdominal breathing, progressive muscle relaxation, and/or guided imagery.  The purpose of this lesson is to educate  patients on the role of stress in their lives and to provide healthy techniques for coping with it.  Learning Barriers/Preferences:  Learning Barriers/Preferences - 01/28/24 1542       Learning Barriers/Preferences   Learning Barriers Sight    Learning Preferences Group Instruction;Individual Instruction;Pictoral;Skilled Demonstration;Verbal Instruction             Education Topics:  Knowledge Questionnaire Score:  Knowledge Questionnaire Score - 04/12/24 1634       Knowledge Questionnaire Score   Pre Score 23/24    Post Score 22/24             Core Components/Risk Factors/Patient Goals at Admission:  Personal Goals and Risk Factors at Admission - 01/28/24 1354       Core Components/Risk Factors/Patient Goals on Admission    Weight Management Weight Loss    Tobacco Cessation --   Quit Smoking 40 years ago   Diabetes Yes    Intervention Provide education about signs/symptoms and action to take for hypo/hyperglycemia.;Provide education about proper nutrition, including hydration, and aerobic/resistive exercise prescription along with prescribed medications to achieve blood glucose in normal ranges: Fasting glucose 65-99 mg/dL    Expected Outcomes Short Term: Participant verbalizes understanding of the signs/symptoms and immediate care of hyper/hypoglycemia, proper foot care and importance of medication, aerobic/resistive exercise and nutrition plan for blood glucose control.;Long Term: Attainment of HbA1C < 7%.    Hypertension Yes    Intervention Provide education on lifestyle modifcations including regular physical activity/exercise, weight management, moderate sodium restriction and increased consumption of fresh fruit, vegetables, and low fat dairy, alcohol moderation, and smoking cessation.;Monitor prescription use compliance.    Expected Outcomes Short Term: Continued assessment and intervention until BP is < 140/39mm HG in hypertensive participants. < 130/14mm HG in  hypertensive participants with diabetes, heart failure or chronic kidney disease.;Long Term: Maintenance of blood pressure at goal levels.    Lipids Yes    Intervention Provide education and support for participant on nutrition & aerobic/resistive exercise along with prescribed medications to achieve LDL 70mg , HDL >40mg .    Expected Outcomes Short Term: Participant states understanding of desired cholesterol values and  is compliant with medications prescribed. Participant is following exercise prescription and nutrition guidelines.;Long Term: Cholesterol controlled with medications as prescribed, with individualized exercise RX and with personalized nutrition plan. Value goals: LDL < 70mg , HDL > 40 mg.    Stress Yes   self-imposed   Intervention Offer individual and/or small group education and counseling on adjustment to heart disease, stress management and health-related lifestyle change. Teach and support self-help strategies.;Refer participants experiencing significant psychosocial distress to appropriate mental health specialists for further evaluation and treatment. When possible, include family members and significant others in education/counseling sessions.    Expected Outcomes Short Term: Participant demonstrates changes in health-related behavior, relaxation and other stress management skills, ability to obtain effective social support, and compliance with psychotropic medications if prescribed.;Long Term: Emotional wellbeing is indicated by absence of clinically significant psychosocial distress or social isolation.             Core Components/Risk Factors/Patient Goals Review:   Goals and Risk Factor Review     Row Name 02/03/24 1351 02/23/24 0956 03/23/24 0804 04/09/24 0945       Core Components/Risk Factors/Patient Goals Review   Personal Goals Review Weight Management/Obesity;Lipids;Hypertension;Diabetes Weight Management/Obesity;Lipids;Hypertension;Diabetes Weight  Management/Obesity;Lipids;Hypertension;Diabetes Weight Management/Obesity;Lipids;Hypertension;Diabetes    Review Camry started cardiac rehab on 02/03/24. Claris did well with exercise. Vital signs were stable. CBG's were elevated as Erhardt did not take his insulin  as he was running late. Aidan says he will be back on track on Wed. Eisen is doing  well exercise at cardiac rehab.  Vital signs have been stable. CBG's have been variable. Mavryk is doing  well exercise at cardiac rehab since his return due to cold symptoms.  Vital signs and CBG's have been stable. Makiah is doing  well exercise at cardiac rehab .  Vital signs have been stable, CBG's have been in the 200's. Will have our RD Sam talk with the patient.    Expected Outcomes Travaughn will continue to participate in cardiac rehab for exercise, nutrition and lifestyle modifications Montie will continue to participate in cardiac rehab for exercise, nutrition and lifestyle modifications Jahmeek will continue to participate in cardiac rehab for exercise, nutrition and lifestyle modifications Anson will continue to participate in cardiac rehab for exercise, nutrition and lifestyle modifications             Core Components/Risk Factors/Patient Goals at Discharge (Final Review):   Goals and Risk Factor Review - 04/09/24 0945       Core Components/Risk Factors/Patient Goals Review   Personal Goals Review Weight Management/Obesity;Lipids;Hypertension;Diabetes    Review Kijuan is doing  well exercise at cardiac rehab .  Vital signs have been stable, CBG's have been in the 200's. Will have our RD Sam talk with the patient.    Expected Outcomes Rainer will continue to participate in cardiac rehab for exercise, nutrition and lifestyle modifications             ITP Comments:  ITP Comments     Row Name 01/28/24 1347 02/23/24 0954 03/23/24 0801 04/09/24 0944     ITP Comments Dr. Gaylyn Keas medical director. Introduction to pritikin education/intensive cardiac rehab.  Initial orientation packet reviewed with patient. 30 Day ITP Review. Fabiano has good attendance and participation in cardiac rehab. 30 Day ITP Review. Hurshel continues to have  good attendance and participation with exercise at cardiac rehab. 30 Day ITP Review. Leroi continues to have  good attendance and participation with exercise at cardiac rehab.  Comments: See ITP Comments

## 2024-04-12 NOTE — Patient Instructions (Signed)
 Diabetes regimen: Tresiba  /  Degludec 5 units daily in the morning / fixed dose.  Novlog 4-6 units with meals three times a day, adjust based on meals size. Continue jardiance  25mg  daily. Metfromin XR 500 mg two times a day.  Continue  glimepiride  2mg  daily.

## 2024-04-12 NOTE — Progress Notes (Signed)
 Outpatient Endocrinology Note [name], MD  04/12/24  Patient's Name: Nathan Duncan    DOB: 10/02/1951    MRN: [id number]                                                    REASON OF VISIT: Follow up for type 2 diabetes mellitus  PCP: Jinger Mount, DO (Inactive)  HISTORY OF PRESENT ILLNESS:   Nathan Duncan is a 73 y.o. old male with past medical history listed below, is here for follow up of type 2 diabetes mellitus.   Pertinent Diabetes History: Patient was diagnosed with type 2 diabetes mellitus in 1985.  Chronic Diabetes Complications : Retinopathy: no. Last ophthalmology exam was done on annually, reportedly. Nephropathy: no, on lisinopril. Peripheral neuropathy: yes numbness and tingling of the feet.  On gabapentin . Coronary artery disease: yes, status post stent. Stroke: no  Relevant comorbidities and cardiovascular risk factors: Obesity: no Body mass index is 26.3 kg/m.  Hypertension: yes Hyperlipidemia. Yes, on statin.   Current / Home Diabetic regimen includes: Tresiba  4 units at supper time.  Novolog  4 units with breakfast and lunch, not with supper. Jardiance  25 mg daily. Metformin  XR 500 mg 2 tabs two times a day with meals.  Glimepiride  2 mg daily.  Prior diabetic medications: Januvia , Rybelsus , Actos , Invokana  in the past.  Glycemic data:    CONTINUOUS GLUCOSE MONITORING SYSTEM (CGMS) INTERPRETATION: At today's visit, we reviewed CGM downloads. The full report is scanned in the media. Reviewing the CGM trends, blood glucose are as follows:  FreeStyle Libre 3 CGM-  Sensor Download (Sensor download was reviewed and summarized below.) Dates: April 8 to April 21 , 2025 for 14 days Glucose Management Indicator: 7.2%  % data captured: 94%    Interpretation: Mostly acceptable blood sugar.  Overnight and early morning blood sugar mostly 130-160 range.  Random hyperglycemia with blood sugar up to 300 range postprandially especially around  late afternoon.  No hypoglycemia.  Hypoglycemia: Patient has minor hypoglycemic episodes. Patient has hypoglycemia awareness.  Factors modifying glucose control: 1.  Diabetic diet assessment: 3 meals a day.  2.  Staying active or exercising: No formal exercise.  Some yard work.  3.  Medication compliance: compliant all of the time.  Interval history  Diabetes regimen as reviewed and noted above.  CGM data as reviewed and noted above.  Recent hemoglobin A1c is 8% and GMI on CGM 7.2%.  He has random hypoglycemia with meals.  Recent laboratory results reviewed.  No other complaints today.  REVIEW OF SYSTEMS As per history of present illness.   PAST MEDICAL HISTORY: Past Medical History:  Diagnosis Date   Diabetes (HCC)    Dyslipidemia     PAST SURGICAL HISTORY: Past Surgical History:  Procedure Laterality Date   CORONARY STENT INTERVENTION N/A 12/02/2023   Procedure: CORONARY STENT INTERVENTION;  Surgeon: Arleen Lacer, MD;  Location[facility] INVASIVE CV LAB;  Service: Cardiovascular;  Laterality: N/A;   LEFT HEART CATH AND CORONARY ANGIOGRAPHY N/A 11/25/2023   Procedure: LEFT HEART CATH AND CORONARY ANGIOGRAPHY;  Surgeon: Arleen Lacer, MD;  Location: [facility] INVASIVE CV LAB;  Service: Cardiovascular;  Laterality: N/A;    ALLERGIES: Allergies  Allergen Reactions   Seasonal Ic [Octacosanol] Other (See Comments)    Congestion   Semaglutide   REFLUX     FAMILY HISTORY:  Family History  Problem Relation Age of Onset   Diabetes Mother     SOCIAL HISTORY: Social History   Socioeconomic History   Marital status: Married    Spouse name: Not on file   Number of children: Not on file   Years of education: Not on file   Highest education level: Not on file  Occupational History   Not on file  Tobacco Use   Smoking status: Former   Smokeless tobacco: Never  Substance and Sexual Activity   Alcohol use: Yes    Comment: occ or 1 per month   Drug use: Never   Sexual  activity: Not on file  Other Topics Concern   Not on file  Social History Narrative   Not on file   Social Drivers of Health   Financial Resource Strain: Not on file  Food Insecurity: Not on file  Transportation Needs: Not on file  Physical Activity: Not on file  Stress: Not on file  Social Connections: Not on file    MEDICATIONS:  Current Outpatient Medications  Medication Sig Dispense Refill   aspirin  EC 81 MG tablet Take 81 mg by mouth at bedtime.     atorvastatin (LIPITOR) 40 MG tablet Take 40 mg by mouth daily.      fluticasone (FLONASE) 50 MCG/ACT nasal spray Place 1 spray into both nostrils daily.     gabapentin  (NEURONTIN ) 100 MG capsule Take 1 capsule (100 mg total) by mouth at bedtime. 90 capsule 3   glimepiride  (AMARYL ) 2 MG tablet Take 1 tablet (2 mg total) by mouth daily with breakfast. 90 tablet 4   insulin  aspart (NOVOLOG  FLEXPEN) 100 UNIT/ML FlexPen Inject 4-5 units into skin 3 times daily before meals. 15 mL 4   insulin  degludec (TRESIBA  FLEXTOUCH) 100 UNIT/ML FlexTouch Pen Inject once daily, adjust as directed with max dose 25 units 6 mL 0   Insulin  Pen Needle (B-D UF III MINI PEN NEEDLES) 31G X 5 MM MISC USE WITH INSULIN  PEN/uses 3 to 4 a day 100 each 5   JARDIANCE  25 MG TABS tablet Take 1 tablet by mouth once daily 90 tablet 0   lisinopril (PRINIVIL,ZESTRIL) 5 MG tablet Take 5 mg by mouth daily.     metFORMIN  (GLUCOPHAGE -XR) 500 MG 24 hr tablet TAKE 2 TABLETS BY MOUTH TWICE DAILY WITH A MEAL 360 tablet 3   nitroGLYCERIN  (NITROSTAT ) 0.4 MG SL tablet Place 1 tablet (0.4 mg total) under the tongue every 5 (five) minutes as needed. 25 tablet 2   ticagrelor  (BRILINTA ) 90 MG TABS tablet Take 1 tablet (90 mg total) by mouth 2 (two) times daily. 180 tablet 3   icosapent  Ethyl (VASCEPA ) 1 g capsule Take 2 capsules (2 g total) by mouth 2 (two) times daily. (Patient not taking: Reported on 04/12/2024) 120 capsule 11   No current facility-administered medications for this  visit.    PHYSICAL EXAM: Vitals:   04/12/24 0832  BP: 114/80  Pulse: 80  Resp: 20  SpO2: 97%  Weight: 188 lb 9.6 oz (85.5 kg)  Height: 5\' 11"  (1.803 m)    Body mass index is 26.3 kg/m.  Wt Readings from Last 3 Encounters:  04/12/24 188 lb 9.6 oz (85.5 kg)  03/25/24 193 lb 6.4 oz (87.7 kg)  01/28/24 194 lb 0.1 oz (88 kg)    General: Well developed, well nourished male in no apparent distress.  HEENT: AT/[place], no external lesions.  Eyes: Conjunctiva  clear and no icterus. Neck: Neck supple  Lungs: Respirations not labored Neurologic: Alert, oriented, normal speech Extremities / Skin: Dry.  Psychiatric: Does not appear depressed or anxious  Diabetic Foot Exam - Simple   No data filed    LABS Reviewed Lab Results  Component Value Date   HGBA1C 8.0 (H) 03/29/2024   HGBA1C 7.9 (H) 12/25/2023   HGBA1C 7.5 (H) 09/29/2023   Lab Results  Component Value Date   FRUCTOSAMINE 311 (H) 07/15/2023   FRUCTOSAMINE 350 (H) 03/04/2023   Lab Results  Component Value Date   CHOL 144 12/02/2023   HDL 31 (L) 12/02/2023   LDLCALC 65 12/02/2023   TRIG 238 (H) 12/02/2023   CHOLHDL 4.6 12/02/2023   Lab Results  Component Value Date   MICRALBCREAT 2.2 07/15/2023   MICRALBCREAT 4.4 06/27/2022   Lab Results  Component Value Date   CREATININE 1.30 (H) 03/29/2024   Lab Results  Component Value Date   GFR 69.04 09/29/2023    ASSESSMENT / PLAN  1. Uncontrolled type 2 diabetes mellitus with hyperglycemia, with long-term current use of insulin  (HCC)     Diabetes Mellitus type 2, complicated by diabetic neuropathy and CAD - Diabetic status / severity: Uncontrolled, improving.  Lab Results  Component Value Date   HGBA1C 8.0 (H) 03/29/2024    - Hemoglobin A1c goal : <7%  GMI on CGM 7.2% mostly acceptable.  He still has random hyperglycemia related with meals.  Discussed about avoiding high carbohydrate meal or take additional 1 to 2 units NovoLog  with meals.  Adjusted diabetes  regimen as follows.  - Medications: See below. Diabetes regimen: No change. Tresiba  /  Degludec 4 units to 5 units daily in the morning / fixed dose.  Adjust Novlog 4-6 units with meals three times a day, adjust based on meals size. Continue jardiance  25mg  daily. Metfromin XR 500 mg 2 tablets two times a day.  Continue glimepiride  2 mg daily.   - Home glucose testing: CGM and check as needed. - Discussed/ Gave Hypoglycemia treatment plan.  # Consult : not required at this time.   # Annual urine for microalbuminuria/ creatinine ratio, no microalbuminuria currently, continue ACE/ARB /lisinopril. Last  Lab Results  Component Value Date   MICRALBCREAT 2.2 07/15/2023    # Foot check nightly / neuropathy, continue gabapentin .  # Annual dilated diabetic eye exams.   - Diet: Make healthy diabetic food choices - Life style / activity / exercise: Discussed.  2. Blood pressure  -  BP Readings from Last 1 Encounters:  04/12/24 114/80    - Control is in target.  - No change in current plans.  3. Lipid status / Hyperlipidemia - Last  Lab Results  Component Value Date   LDLCALC 65 12/02/2023   - Continue atorvastatin 40 mg daily.  Managed by primary care provider.  Diagnoses and all orders for this visit:  Uncontrolled type 2 diabetes mellitus with hyperglycemia, with long-term current use of insulin  (HCC)   DISPOSITION Follow up in clinic in 3  months suggested.  Labs on the same day of the visit.   All questions answered and patient verbalized understanding of the plan.  [place] Moniqua Engebretsen, MD [facility] Endocrinology [facility] [address] [place], [address] [place], [place] [address] Phone # [phone]  At least part of this note was generated using voice recognition software. Inadvertent word errors may have occurred, which were not recognized during the proofreading process.

## 2024-04-14 ENCOUNTER — Encounter (HOSPITAL_COMMUNITY)
Admission: RE | Admit: 2024-04-14 | Discharge: 2024-04-14 | Disposition: A | Discharge status: Home / Self Care | Payer: Medicare Other | Source: Ambulatory Visit | Attending: Cardiology

## 2024-04-14 DIAGNOSIS — Z955 Presence of coronary angioplasty implant and graft: Secondary | ICD-10-CM

## 2024-04-16 ENCOUNTER — Encounter (HOSPITAL_COMMUNITY)
Admission: RE | Admit: 2024-04-16 | Discharge: 2024-04-16 | Disposition: A | Discharge status: Home / Self Care | Payer: Medicare Other | Source: Ambulatory Visit | Attending: Cardiology | Admitting: Cardiology

## 2024-04-16 DIAGNOSIS — Z955 Presence of coronary angioplasty implant and graft: Secondary | ICD-10-CM | POA: Diagnosis not present

## 2024-04-19 ENCOUNTER — Encounter (HOSPITAL_COMMUNITY)
Admission: RE | Admit: 2024-04-19 | Discharge: 2024-04-19 | Disposition: A | Discharge status: Home / Self Care | Payer: Medicare Other | Source: Ambulatory Visit | Attending: Cardiology | Admitting: Cardiology

## 2024-04-19 DIAGNOSIS — Z955 Presence of coronary angioplasty implant and graft: Secondary | ICD-10-CM | POA: Diagnosis not present

## 2024-04-21 ENCOUNTER — Encounter (HOSPITAL_COMMUNITY)
Admission: RE | Admit: 2024-04-21 | Discharge: 2024-04-21 | Disposition: A | Discharge status: Home / Self Care | Payer: Medicare Other | Source: Ambulatory Visit | Attending: Cardiology

## 2024-04-21 VITALS — BP 130/64 | HR 72 | Ht 71.0 in | Wt 191.6 lb

## 2024-04-21 DIAGNOSIS — Z955 Presence of coronary angioplasty implant and graft: Secondary | ICD-10-CM

## 2024-04-21 NOTE — Progress Notes (Signed)
 Discharge Progress Report  Patient Details  Name: Nathan Duncan MRN: [id number] Date of Birth: 1951/09/26 Referring Provider:   Flowsheet Row INTENSIVE CARDIAC REHAB ORIENT from 01/28/2024 in [facility]  Referring Provider Dorothye Gathers        Number of Visits: 33  Reason for Discharge:  Patient reached a stable level of exercise. Patient independent in their exercise. Patient has met program and personal goals.  Smoking History:  Social History   Tobacco Use  Smoking Status Former  Smokeless Tobacco Never    Diagnosis:  12/02/23 S/P coronary artery stent placement  ADL [facility]:   Initial Exercise Prescription:  Initial Exercise Prescription - 01/28/24 1500       Date of Initial Exercise RX and Referring Provider   Date 01/28/24    Referring Provider Dorothye Gathers    Expected Discharge Date 05/21/24      Treadmill   MPH 2    Grade 0    Minutes 15    METs 2.5      Bike   Level 1.5    Watts 15    Minutes 15    METs 2.6      Prescription Details   Frequency (times per week) 3days/week    Duration Progress to 30 minutes of continuous aerobic without signs/symptoms of physical distress      Intensity   THRR 40-80% of Max Heartrate 58-117    Ratings of Perceived Exertion 11-13    Perceived Dyspnea 0-4      Progression   Progression Continue progressive overload as per policy without signs/symptoms or physical distress.      Resistance Training   Training Prescription Yes    Weight 4lbs    Reps 10-15             Discharge Exercise Prescription (Final Exercise Prescription Changes):  Exercise Prescription Changes - 04/21/24 1022       Response to Exercise   Blood Pressure (Admit) 130/64    Blood Pressure (Exit) 110/68    Heart Rate (Admit) 72 bpm    Heart Rate (Exercise) 115 bpm    Heart Rate (Exit) 81 bpm    Rating of Perceived Exertion (Exercise) 8    Symptoms None    Comments  Leonitus completed the cardiac rehab program today.    Duration Continue with 30 min of aerobic exercise without signs/symptoms of physical distress.    Intensity THRR unchanged      Progression   Progression Continue to progress workloads to maintain intensity without signs/symptoms of physical distress.    Average METs 4.3      Resistance Training   Training Prescription No    Weight Relaxation day, no weights      Interval Training   Interval Training No      Treadmill   MPH 3    Grade 2    Minutes 15    METs 4.12      Recumbant Bike   Level 4    RPM 69    Watts 60    Minutes 15    METs 4.4      Home Exercise Plan   Plans to continue exercise at Home (comment)   Walking   Frequency Add 2 additional days to program exercise sessions.    Initial Home Exercises Provided 03/17/24             Functional Capacity:  6 Minute Walk  Row Name 01/28/24 1543 04/12/24 1042       6 Minute Walk   Phase Initial Discharge    Distance 1080 feet 1772 feet    Distance % Change -- 64.07 %    Distance Feet Change -- 692 ft    Walk Time 6 minutes 6 minutes    # of Rest Breaks 0 0    MPH 2.04 3.36    METS 2.4 3.67    RPE 11 9    Perceived Dyspnea  0 0    VO2 Peak 8.42 12.85    Symptoms No No    Resting HR 65 bpm 77 bpm    Resting BP 138/54 112/58    Resting Oxygen Saturation  96 % --    Max Ex. HR 102 bpm 100 bpm    Max Ex. BP 154/50 142/52    2 Minute Post BP 130/50 102/50             Psychological, QOL, Others - Outcomes: PHQ 2/9:    04/21/2024   10:35 AM 04/12/2024    4:46 PM 02/02/2024   10:53 AM 01/28/2024    1:52 PM 05/13/2022   10:46 AM  Depression screen PHQ 2/9  Decreased Interest 0 0 0 3 0  Down, Depressed, Hopeless 0 0 0 1 0  PHQ - 2 Score 0 0 0 4 0  Altered sleeping 0 1 0 1   Tired, decreased energy 0 1 1 1    Change in appetite 0 0 0 0   Feeling bad or failure about yourself  0 0 0 0   Trouble concentrating 0 0 0 0   Moving slowly or  fidgety/restless 1 1 0 1   Suicidal thoughts 0 0 0 0   PHQ-9 Score 1 3 1 7    Difficult doing work/chores  Not difficult at all Not difficult at all Not difficult at all     Quality of Life:  Quality of Life - 04/12/24 1637       Quality of Life   Select Quality of Life      Quality of Life Scores   Health/Function Pre 24.3 %    Health/Function Post 23.5 %    Health/Function % Change -3.29 %    Socioeconomic Pre 25.25 %    Socioeconomic Post 23.57 %    Socioeconomic % Change  -6.65 %    Psych/Spiritual Pre 23.71 %    Psych/Spiritual Post 24 %    Psych/Spiritual % Change 1.22 %    Family Pre 30 %    Family Post 26.4 %    Family % Change -12 %    GLOBAL Pre 25.21 %    GLOBAL Post 24.04 %    GLOBAL % Change -4.64 %             Personal Goals: Goals established at orientation with interventions provided to work toward goal.  Personal Goals and Risk Factors at Admission - 01/28/24 1354       Core Components/Risk Factors/Patient Goals on Admission    Weight Management Weight Loss    Tobacco Cessation --   Quit Smoking 40 years ago   Diabetes Yes    Intervention Provide education about signs/symptoms and action to take for hypo/hyperglycemia.;Provide education about proper nutrition, including hydration, and aerobic/resistive exercise prescription along with prescribed medications to achieve blood glucose in normal ranges: Fasting glucose 65-99 mg/dL    Expected Outcomes Short Term: Participant verbalizes understanding of the signs/symptoms  and immediate care of hyper/hypoglycemia, proper foot care and importance of medication, aerobic/resistive exercise and nutrition plan for blood glucose control.;Long Term: Attainment of HbA1C < 7%.    Hypertension Yes    Intervention Provide education on lifestyle modifcations including regular physical activity/exercise, weight management, moderate sodium restriction and increased consumption of fresh fruit, vegetables, and low fat dairy,  alcohol moderation, and smoking cessation.;Monitor prescription use compliance.    Expected Outcomes Short Term: Continued assessment and intervention until BP is < 140/54mm HG in hypertensive participants. < 130/90mm HG in hypertensive participants with diabetes, heart failure or chronic kidney disease.;Long Term: Maintenance of blood pressure at goal levels.    Lipids Yes    Intervention Provide education and support for participant on nutrition & aerobic/resistive exercise along with prescribed medications to achieve LDL 70mg , HDL >40mg .    Expected Outcomes Short Term: Participant states understanding of desired cholesterol values and is compliant with medications prescribed. Participant is following exercise prescription and nutrition guidelines.;Long Term: Cholesterol controlled with medications as prescribed, with individualized exercise RX and with personalized nutrition plan. Value goals: LDL < 70mg , HDL > 40 mg.    Stress Yes   self-imposed   Intervention Offer individual and/or small group education and counseling on adjustment to heart disease, stress management and health-related lifestyle change. Teach and support self-help strategies.;Refer participants experiencing significant psychosocial distress to appropriate mental health specialists for further evaluation and treatment. When possible, include family members and significant others in education/counseling sessions.    Expected Outcomes Short Term: Participant demonstrates changes in health-related behavior, relaxation and other stress management skills, ability to obtain effective social support, and compliance with psychotropic medications if prescribed.;Long Term: Emotional wellbeing is indicated by absence of clinically significant psychosocial distress or social isolation.              Personal Goals Discharge:  Goals and Risk Factor Review     Row Name 02/03/24 1351 02/23/24 0956 03/23/24 0804 04/09/24 0945 04/29/24 1138      Core Components/Risk Factors/Patient Goals Review   Personal Goals Review Weight Management/Obesity;Lipids;Hypertension;Diabetes Weight Management/Obesity;Lipids;Hypertension;Diabetes Weight Management/Obesity;Lipids;Hypertension;Diabetes Weight Management/Obesity;Lipids;Hypertension;Diabetes Weight Management/Obesity;Lipids;Hypertension;Diabetes   Review Arron started cardiac rehab on 02/03/24. Calen did well with exercise. Vital signs were stable. CBG's were elevated as Halden did not take his insulin  as he was running late. Sneh says he will be back on track on Wed. Colm is doing  well exercise at cardiac rehab.  Vital signs have been stable. CBG's have been variable. Orvid is doing  well exercise at cardiac rehab since his return due to cold symptoms.  Vital signs and CBG's have been stable. Uzay is doing  well exercise at cardiac rehab .  Vital signs have been stable, CBG's have been in the 200's. Will have our RD Sam talk with the patient. Abdon did  well exercise at cardiac rehab .  Vital signs were and CBG's were  stable. Matthe completed cardiac rehab on 04/21/24   Expected Outcomes Locklan will continue to participate in cardiac rehab for exercise, nutrition and lifestyle modifications Krista will continue to participate in cardiac rehab for exercise, nutrition and lifestyle modifications Joel will continue to participate in cardiac rehab for exercise, nutrition and lifestyle modifications Jaxs will continue to participate in cardiac rehab for exercise, nutrition and lifestyle modifications Adilson will continue to exercise, follow nutrition and lifestyle modifications upon completion of cardiac rehab            Exercise Goals and Review:  Exercise Goals  Row Name 01/28/24 1548             Exercise Goals   Increase Physical Activity Yes       Intervention Provide advice, education, support and counseling about physical activity/exercise needs.;Develop an individualized exercise prescription  for aerobic and resistive training based on initial evaluation findings, risk stratification, comorbidities and participant's personal goals.       Expected Outcomes Short Term: Attend rehab on a regular basis to increase amount of physical activity.;Long Term: Exercising regularly at least 3-5 days a week.;Long Term: Add in home exercise to make exercise part of routine and to increase amount of physical activity.       Increase Strength and Stamina Yes       Intervention Provide advice, education, support and counseling about physical activity/exercise needs.;Develop an individualized exercise prescription for aerobic and resistive training based on initial evaluation findings, risk stratification, comorbidities and participant's personal goals.       Expected Outcomes Short Term: Increase workloads from initial exercise prescription for resistance, speed, and METs.;Long Term: Improve cardiorespiratory fitness, muscular endurance and strength as measured by increased METs and functional capacity ( );Short Term: Perform resistance training exercises routinely during rehab and add in resistance training at home       Able to understand and use rate of perceived exertion (RPE) scale Yes       Intervention Provide education and explanation on how to use RPE scale       Expected Outcomes Short Term: Able to use RPE daily in rehab to express subjective intensity level;Long Term:  Able to use RPE to guide intensity level when exercising independently       Knowledge and understanding of Target Heart Rate Range (THRR) Yes       Intervention Provide education and explanation of THRR including how the numbers were predicted and where they are located for reference       Expected Outcomes Short Term: Able to state/look up THRR;Long Term: Able to use THRR to govern intensity when exercising independently;Short Term: Able to use daily as guideline for intensity in rehab       Understanding of Exercise  Prescription Yes       Intervention Provide education, explanation, and written materials on patient's individual exercise prescription       Expected Outcomes Short Term: Able to explain program exercise prescription;Long Term: Able to explain home exercise prescription to exercise independently                Exercise Goals Re-Evaluation:  Exercise Goals Re-Evaluation     Row Name 02/02/24 1132 03/15/24 1221 03/17/24 1052 04/12/24 1052 04/21/24 1022     Exercise Goal Re-Evaluation   Exercise Goals Review Increase Physical Activity;Increase Strength and Stamina;Able to understand and use rate of perceived exertion (RPE) scale Increase Physical Activity;Increase Strength and Stamina;Able to understand and use rate of perceived exertion (RPE) scale;Knowledge and understanding of Target Heart Rate Range (THRR);Understanding of Exercise Prescription Increase Physical Activity;Increase Strength and Stamina;Able to understand and use rate of perceived exertion (RPE) scale;Knowledge and understanding of Target Heart Rate Range (THRR);Understanding of Exercise Prescription Increase Physical Activity;Increase Strength and Stamina;Able to understand and use rate of perceived exertion (RPE) scale;Knowledge and understanding of Target Heart Rate Range (THRR);Understanding of Exercise Prescription Increase Physical Activity;Increase Strength and Stamina;Able to understand and use rate of perceived exertion (RPE) scale;Knowledge and understanding of Target Heart Rate Range (THRR);Understanding of Exercise Prescription   Comments Karmine was able to  understand and use RPE scale appropriately. Reviewed goals with patient. Prior to missing three weeks of CR d/t being out sick Davine reported that his self-confidence and fitness was improving. Martize also started walking on Tuesday's and Thursday to continue his exercise. Unfortuantely Bob became sick missed some time but he is looking forward to slowly progressing and  getting back into routine. Will continue to watch. Reviewed exercise prescription with Marylou Sobers. Sumedh will complete the cardiac rehab program next week. His functional capacity increased 64% as meausred by the . Andrej completed the cardiac rehab program today and plans to continue walking as his mode of exercise.   Expected Outcomes Progress workloads as tolerated to help increase strength and stamina. Nicola will get back into his exercise routine. Barkley will walk at least 12-15 minutes 2 days/week in addition to exercise at cardiac rehab. Doua will continue walking at home upon completion of the cardiac rehab program. Vicky will continue walking at home to maintain health and fitness gains.            Nutrition & Weight - Outcomes:  Pre Biometrics - 01/28/24 1547       Pre Biometrics   Height 5\' 11"  (1.803 m)    Weight 88 kg    Waist Circumference 44 inches    Hip Circumference 42 inches    Waist to Hip Ratio 1.05 %    BMI (Calculated) 27.07    Triceps Skinfold 14 mm    % Body Fat 28.9 %    Grip Strength 42 kg    Flexibility 14 in    Single Leg Stand 7.97 seconds             Post Biometrics - 04/21/24 1010        Post  Biometrics   Height 5\' 11"  (1.803 m)    Waist Circumference 42.5 inches    Hip Circumference 40 inches    Waist to Hip Ratio 1.06 %    Triceps Skinfold 18 mm    % Body Fat 29.1 %    Grip Strength 36 kg    Flexibility 14.75 in    Single Leg Stand 4.31 seconds             Nutrition:  Nutrition Therapy & Goals - 04/19/24 1129       Nutrition Therapy   Diet Heart healthy/carbohydrate consistent diet    Drug/Food Interactions Statins/Certain Fruits      Personal Nutrition Goals   Nutrition Goal Patient to identify strategies for reducing cardiovascular risk by attending the [facility] education and nutrition series weekly.   goal not met.   Personal Goal #2 Patient to improve diet quality by using the plate method as a guide for meal planning to  include lean protein/plant protein, fruits, vegetables, whole grains, nonfat dairy as part of a well-balanced diet.   goal in progress.   Comments Goals not met. Refujio has medical history of s/p coronary artery stent placement, HTN, hyperlipidemia, DM2, OSA. Triglycerides remain elevated (vascepa ). LDL is at goal. A1c remains elevated above goal and has worsened since starting with our program. Malcomb has not regularly attended the [facility] education and nutrition series and declines nutrition education/intervention. He has maintained his weight since starting with our program. Patient will continue to benefit from participation in intensive cardiac rehab for nutrition, exercise, and lifestyle modification.      Intervention Plan   Intervention Prescribe, educate and counsel regarding individualized specific dietary modifications aiming towards targeted core components  such as weight, hypertension, lipid management, diabetes, heart failure and other comorbidities.;Nutrition handout(s) given to patient.    Expected Outcomes Long Term Goal: Adherence to prescribed nutrition plan.;Short Term Goal: Understand basic principles of dietary content, such as calories, fat, sodium, cholesterol and nutrients.             Nutrition Discharge:  Nutrition Assessments - 04/16/24 1129       Rate Your Plate Scores   Pre Score --    Post Score 52             Education Questionnaire Score:  Knowledge Questionnaire Score - 04/12/24 1634       Knowledge Questionnaire Score   Pre Score 23/24    Post Score 22/24             Goals reviewed with patient; copy given to patient.Pt graduates from  Intensive/Traditional cardiac rehab program on 04/21/24 with completion of  27 exercise and 6  education sessions. Pt maintained good attendance and progressed nicely during his  participation in rehab as evidenced by increased MET level.  Cleave increased his distance on his post exercise walk test by 692 feet.   Medication list reconciled. Repeat  PHQ score- 1 .  Pt has made significant lifestyle changes and should be commended for their success. Antron  achieved their goals during cardiac rehab.   Pt plans to continue exercise at home walking. We are proud of Derec's progress!Monte Antonio RN BSN

## 2024-05-29 ENCOUNTER — Other Ambulatory Visit: Payer: Self-pay | Admitting: Endocrinology

## 2024-05-29 DIAGNOSIS — E1169 Type 2 diabetes mellitus with other specified complication: Secondary | ICD-10-CM

## 2024-06-10 DIAGNOSIS — Z86007 Personal history of in-situ neoplasm of skin: Secondary | ICD-10-CM | POA: Diagnosis not present

## 2024-06-10 DIAGNOSIS — L814 Other melanin hyperpigmentation: Secondary | ICD-10-CM | POA: Diagnosis not present

## 2024-06-10 DIAGNOSIS — L821 Other seborrheic keratosis: Secondary | ICD-10-CM | POA: Diagnosis not present

## 2024-06-10 DIAGNOSIS — D225 Melanocytic nevi of trunk: Secondary | ICD-10-CM | POA: Diagnosis not present

## 2024-06-10 DIAGNOSIS — Z08 Encounter for follow-up examination after completed treatment for malignant neoplasm: Secondary | ICD-10-CM | POA: Diagnosis not present

## 2024-06-27 ENCOUNTER — Other Ambulatory Visit: Payer: Self-pay | Admitting: Endocrinology

## 2024-06-28 NOTE — Telephone Encounter (Signed)
 Refill request complete

## 2024-08-03 ENCOUNTER — Encounter: Payer: Self-pay | Admitting: Endocrinology

## 2024-08-03 ENCOUNTER — Ambulatory Visit: Payer: Self-pay | Admitting: Endocrinology

## 2024-08-03 ENCOUNTER — Ambulatory Visit (INDEPENDENT_AMBULATORY_CARE_PROVIDER_SITE_OTHER): Admitting: Endocrinology

## 2024-08-03 VITALS — BP 128/60 | HR 60 | Resp 20 | Ht 71.0 in | Wt 190.0 lb

## 2024-08-03 DIAGNOSIS — Z794 Long term (current) use of insulin: Secondary | ICD-10-CM | POA: Diagnosis not present

## 2024-08-03 DIAGNOSIS — E1165 Type 2 diabetes mellitus with hyperglycemia: Secondary | ICD-10-CM

## 2024-08-03 DIAGNOSIS — E1169 Type 2 diabetes mellitus with other specified complication: Secondary | ICD-10-CM | POA: Diagnosis not present

## 2024-08-03 LAB — POCT GLYCOSYLATED HEMOGLOBIN (HGB A1C): Hemoglobin A1C: 7.8 % — AB (ref 4.0–5.6)

## 2024-08-03 MED ORDER — NOVOLOG FLEXPEN 100 UNIT/ML ~~LOC~~ SOPN: PEN_INJECTOR | SUBCUTANEOUS | 4 refills | Status: DC

## 2024-08-03 MED ORDER — INSULIN DEGLUDEC FLEXTOUCH 100 UNIT/ML ~~LOC~~ SOPN: 6.0000 [IU] | PEN_INJECTOR | Freq: Every day | SUBCUTANEOUS | 4 refills | Status: AC

## 2024-08-03 NOTE — Progress Notes (Signed)
 Outpatient Endocrinology Note [place] Nathan Isley, MD  08/03/24  Patient's Name: Nathan Duncan    DOB: 1951/04/04    MRN: [id number]                                                    REASON OF VISIT: Follow up for type 2 diabetes mellitus  PCP: Cindy Clotilda HERO, DO (Inactive)  HISTORY OF PRESENT ILLNESS:   Nathan Duncan is a 73 y.o. old male with past medical history listed below, is here for follow up of type 2 diabetes mellitus.   Pertinent Diabetes History: Patient was diagnosed with type 2 diabetes mellitus in 1985.  Chronic Diabetes Complications : Retinopathy: no. Last ophthalmology exam was done on annually, reportedly. Nephropathy: no, on lisinopril. Peripheral neuropathy: yes numbness and tingling of the feet.  On gabapentin. Coronary artery disease: yes, status post stent. Stroke: no  Relevant comorbidities and cardiovascular risk factors: Obesity: no Body mass index is 26.5 kg/m.  Hypertension: yes Hyperlipidemia. Yes, on statin.   Current / Home Diabetic regimen includes: Tresiba 6 units at supper time.  Novolog 4-6 units with breakfast and lunch, not with supper. Jardiance 25 mg daily. Metformin XR 500 mg 2 tabs two times a day with meals.  Glimepiride 2 mg daily.  Prior diabetic medications: Januvia, Rybelsus, Actos, Invokana in the past.  Glycemic data:    CONTINUOUS GLUCOSE MONITORING SYSTEM (CGMS) INTERPRETATION: At today's visit, we reviewed CGM downloads. The full report is scanned in the media. Reviewing the CGM trends, blood glucose are as follows:  FreeStyle Libre 3 CGM-  Sensor Download (Sensor download was reviewed and summarized below.) Dates: July 30 to August 03, 2024 for 14 days Glucose Management Indicator: 6.9%  % data captured: 93%     Interpretation: Mostly acceptable blood sugar.  Has occasional mild hyperglycemia with blood sugar 180s and sometimes up to 200 postprandially, otherwise no concerning hyperglycemia.  No  hypoglycemia.   Hypoglycemia: Patient has no hypoglycemic episodes. Patient has hypoglycemia awareness.  Factors modifying glucose control: 1.  Diabetic diet assessment: 3 meals a day.  2.  Staying active or exercising: No formal exercise.  Some yard work.  3.  Medication compliance: compliant all of the time.  Interval history  CGM data as reviewed above.  GMI on CGM 6.9%.  Hemoglobin A1c slightly improved to 7.8%.  Diabetes stage manage reviewed and noted above.  No other complaints today.  REVIEW OF SYSTEMS As per history of present illness.   PAST MEDICAL HISTORY: Past Medical History:  Diagnosis Date   Diabetes (HCC)    Dyslipidemia     PAST SURGICAL HISTORY: Past Surgical History:  Procedure Laterality Date   CORONARY STENT INTERVENTION N/A 12/02/2023   Procedure: CORONARY STENT INTERVENTION;  Surgeon: Anner Alm ORN, MD;  Location: [facility] INVASIVE CV LAB;  Service: Cardiovascular;  Laterality: N/A;   LEFT HEART CATH AND CORONARY ANGIOGRAPHY N/A 11/25/2023   Procedure: LEFT HEART CATH AND CORONARY ANGIOGRAPHY;  Surgeon: Anner Alm ORN, MD;  Location: [facility] INVASIVE CV LAB;  Service: Cardiovascular;  Laterality: N/A;    ALLERGIES: Allergies  Allergen Reactions   Seasonal Ic [Octacosanol] Other (See Comments)    Congestion   Semaglutide     REFLUX     FAMILY HISTORY:  Family History  Problem Relation Age of Onset  Diabetes Mother     SOCIAL HISTORY: Social History   Socioeconomic History   Marital status: Married    Spouse name: Not on file   Number of children: Not on file   Years of education: Not on file   Highest education level: Not on file  Occupational History   Not on file  Tobacco Use   Smoking status: Former   Smokeless tobacco: Never  Substance and Sexual Activity   Alcohol use: Yes    Comment: occ or 1 per month   Drug use: Never   Sexual activity: Not on file  Other Topics Concern   Not on file  Social History Narrative   Not on  file   Social Drivers of Health   Financial Resource Strain: Not on file  Food Insecurity: Not on file  Transportation Needs: Not on file  Physical Activity: Not on file  Stress: Not on file  Social Connections: Not on file    MEDICATIONS:  Current Outpatient Medications  Medication Sig Dispense Refill   aspirin  EC 81 MG tablet Take 81 mg by mouth at bedtime.     atorvastatin (LIPITOR) 40 MG tablet Take 40 mg by mouth daily.      fluticasone (FLONASE) 50 MCG/ACT nasal spray Place 1 spray into both nostrils daily.     gabapentin  (NEURONTIN ) 100 MG capsule Take 1 capsule (100 mg total) by mouth at bedtime. 90 capsule 3   glimepiride  (AMARYL ) 2 MG tablet Take 1 tablet (2 mg total) by mouth daily with breakfast. 90 tablet 4   icosapent  Ethyl (VASCEPA ) 1 g capsule Take 2 capsules (2 g total) by mouth 2 (two) times daily. 120 capsule 11   Insulin  Pen Needle (B-D UF III MINI PEN NEEDLES) 31G X 5 MM MISC USE WITH INSULIN  PEN/uses 3 to 4 a day 100 each 5   JARDIANCE  25 MG TABS tablet Take 1 tablet by mouth once daily 90 tablet 0   lisinopril (PRINIVIL,ZESTRIL) 5 MG tablet Take 5 mg by mouth daily.     metFORMIN  (GLUCOPHAGE -XR) 500 MG 24 hr tablet TAKE 2 TABLETS BY MOUTH TWICE DAILY WITH A MEAL 360 tablet 3   nitroGLYCERIN  (NITROSTAT ) 0.4 MG SL tablet Place 1 tablet (0.4 mg total) under the tongue every 5 (five) minutes as needed. 25 tablet 2   ticagrelor  (BRILINTA ) 90 MG TABS tablet Take 1 tablet (90 mg total) by mouth 2 (two) times daily. 180 tablet 3   insulin  aspart (NOVOLOG  FLEXPEN) 100 UNIT/ML FlexPen Inject 4-7 units into skin 3 times daily before meals. 15 mL 4   Insulin  Degludec FlexTouch 100 UNIT/ML SOPN Inject 6 Units into the skin daily. 15 mL 4   No current facility-administered medications for this visit.    PHYSICAL EXAM: Vitals:   08/03/24 0847  BP: 128/60  Pulse: 60  Resp: 20  SpO2: 98%  Weight: 190 lb (86.2 kg)  Height: 5' 11 (1.803 m)     Body mass index is 26.5  kg/m.  Wt Readings from Last 3 Encounters:  08/03/24 190 lb (86.2 kg)  04/21/24 191 lb 9.3 oz (86.9 kg)  04/12/24 188 lb 9.6 oz (85.5 kg)    General: Well developed, well nourished male in no apparent distress.  HEENT: AT/[place], no external lesions.  Eyes: Conjunctiva clear and no icterus. Neck: Neck supple  Lungs: Respirations not labored Neurologic: Alert, oriented, normal speech Extremities / Skin: Dry.  Psychiatric: Does not appear depressed or anxious  Diabetic Foot Exam -  Simple   No data filed    LABS Reviewed Lab Results  Component Value Date   HGBA1C 7.8 (A) 08/03/2024   HGBA1C 8.0 (H) 03/29/2024   HGBA1C 7.9 (H) 12/25/2023   Lab Results  Component Value Date   FRUCTOSAMINE 311 (H) 07/15/2023   FRUCTOSAMINE 350 (H) 03/04/2023   Lab Results  Component Value Date   CHOL 144 12/02/2023   HDL 31 (L) 12/02/2023   LDLCALC 65 12/02/2023   TRIG 238 (H) 12/02/2023   CHOLHDL 4.6 12/02/2023   No results found for: [facility]  Lab Results  Component Value Date   CREATININE 1.30 (H) 03/29/2024   Lab Results  Component Value Date   GFR 69.04 09/29/2023    ASSESSMENT / PLAN  1. Uncontrolled type 2 diabetes mellitus with hyperglycemia, with long-term current use of insulin  (HCC)   2. Type 2 diabetes mellitus with other specified complication, with long-term current use of insulin  (HCC)      Diabetes Mellitus type 2, complicated by diabetic neuropathy and CAD - Diabetic status / severity: Uncontrolled, improving.  Lab Results  Component Value Date   HGBA1C 7.8 (A) 08/03/2024    - Hemoglobin A1c goal : <6.5%  Overall acceptable blood sugar on CGM, GMI on CGM 6.9%.  - Medications: See below. Diabetes regimen: No change. Continue Tresiba  60 units daily.  Adjust Novlog 4-7 units with meals three times a day, adjust based on meals size. Continue jardiance  25mg  daily. Metfromin XR 500 mg 2 tablets two times a day.  Continue glimepiride  2 mg daily.   -  Home glucose testing: CGM and check as needed. - Discussed/ Gave Hypoglycemia treatment plan.  # Consult : not required at this time.   # Annual urine for microalbuminuria/ creatinine ratio, no microalbuminuria currently, continue ACE/ARB /lisinopril.  Will check today. Last  No results found for: MICRALBCREAT   # Foot check nightly / neuropathy, continue gabapentin .  # Annual dilated diabetic eye exams.   - Diet: Make healthy diabetic food choices - Life style / activity / exercise: Discussed.  2. Blood pressure  -  BP Readings from Last 1 Encounters:  08/03/24 128/60    - Control is in target.  - No change in current plans.  3. Lipid status / Hyperlipidemia - Last  Lab Results  Component Value Date   LDLCALC 65 12/02/2023   - Continue atorvastatin 40 mg daily.  Managed by primary care provider.  Diagnoses and all orders for this visit:  Uncontrolled type 2 diabetes mellitus with hyperglycemia, with long-term current use of insulin  (HCC) -     POCT glycosylated hemoglobin (Hb A1C) -     Microalbumin / creatinine urine ratio  Type 2 diabetes mellitus with other specified complication, with long-term current use of insulin  (HCC) -     Insulin  Degludec FlexTouch 100 UNIT/ML SOPN; Inject 6 Units into the skin daily. -     insulin  aspart (NOVOLOG  FLEXPEN) 100 UNIT/ML FlexPen; Inject 4-7 units into skin 3 times daily before meals.    DISPOSITION Follow up in clinic in 3  months suggested.    All questions answered and patient verbalized understanding of the plan.  [place] Anatole Apollo, MD [facility] Endocrinology [facility] [address] [place], [address] [place], [place] [address] Phone # [phone]  At least part of this note was generated using voice recognition software. Inadvertent word errors may have occurred, which were not recognized during the proofreading process.

## 2024-08-04 LAB — MICROALBUMIN / CREATININE URINE RATIO
Creatinine, Urine: 55 mg/dL (ref 20–320)
Microalb Creat Ratio: 20 mg/g{creat} (ref <30)
Microalb, Ur: 1.1 mg/dL

## 2024-08-06 ENCOUNTER — Other Ambulatory Visit: Payer: Self-pay

## 2024-08-06 DIAGNOSIS — E1142 Type 2 diabetes mellitus with diabetic polyneuropathy: Secondary | ICD-10-CM

## 2024-08-06 MED ORDER — BD PEN NEEDLE MINI U/F 31G X 5 MM MISC: 5 refills | Status: AC

## 2024-08-19 ENCOUNTER — Encounter: Payer: Self-pay | Admitting: Endocrinology

## 2024-08-31 DIAGNOSIS — Z23 Encounter for immunization: Secondary | ICD-10-CM | POA: Diagnosis not present

## 2024-10-05 ENCOUNTER — Other Ambulatory Visit: Payer: Self-pay | Admitting: Endocrinology

## 2024-10-18 ENCOUNTER — Other Ambulatory Visit: Payer: Self-pay

## 2024-10-18 MED ORDER — GLIMEPIRIDE 2 MG PO TABS: 2.0000 mg | ORAL_TABLET | Freq: Every day | ORAL | 1 refills | Status: AC

## 2024-11-03 ENCOUNTER — Ambulatory Visit (INDEPENDENT_AMBULATORY_CARE_PROVIDER_SITE_OTHER): Admitting: Endocrinology

## 2024-11-03 ENCOUNTER — Ambulatory Visit: Payer: Self-pay | Admitting: Endocrinology

## 2024-11-03 ENCOUNTER — Encounter: Payer: Self-pay | Admitting: Endocrinology

## 2024-11-03 VITALS — BP 102/60 | HR 63 | Resp 16 | Ht 71.0 in | Wt 191.6 lb

## 2024-11-03 DIAGNOSIS — Z7984 Long term (current) use of oral hypoglycemic drugs: Secondary | ICD-10-CM | POA: Diagnosis not present

## 2024-11-03 DIAGNOSIS — E1142 Type 2 diabetes mellitus with diabetic polyneuropathy: Secondary | ICD-10-CM | POA: Diagnosis not present

## 2024-11-03 DIAGNOSIS — Z794 Long term (current) use of insulin: Secondary | ICD-10-CM | POA: Diagnosis not present

## 2024-11-03 LAB — POCT GLYCOSYLATED HEMOGLOBIN (HGB A1C): Hemoglobin A1C: 7.3 % — AB (ref 4.0–5.6)

## 2024-11-03 NOTE — Patient Instructions (Signed)
 Diabetes regimen: Tresiba  /  Degludec 5 -6  units daily in the morning / fixed dose.  Novlog 4-6 units with meals three times a day, adjust based on meals size. Continue jardiance  25mg  daily. Metfromin XR 500 mg two times a day.   Continue  glimepiride  2mg  daily. Check what dose are you taking 1/2 of full tablet?

## 2024-11-03 NOTE — Telephone Encounter (Signed)
 If you are taking glimepiride  2 mg 1 tablet daily, I recommend to take half tablet daily.  Thanks.

## 2024-11-03 NOTE — Progress Notes (Signed)
 Outpatient Endocrinology Note [place] Dalporto, MD  11/03/24  Patient's Name: Nathan Duncan    DOB: 12-28-1950    MRN: [id number]                                                    REASON OF VISIT: Follow up for type 2 diabetes mellitus  PCP: Cindy Clotilda HERO, DO  HISTORY OF PRESENT ILLNESS:   ZINEDINE ELLNER is a 73 y.o. old male with past medical history listed below, is here for follow up of type 2 diabetes mellitus.   Pertinent Diabetes History: Patient was diagnosed with type 2 diabetes mellitus in 1985.  Chronic Diabetes Complications : Retinopathy: no. Last ophthalmology exam was done on annually, reportedly. Nephropathy: no, on lisinopril. Peripheral neuropathy: yes numbness and tingling of the feet.  On gabapentin . Coronary artery disease: yes, status post stent. Stroke: no  Relevant comorbidities and cardiovascular risk factors: Obesity: no Body mass index is 26.72 kg/m.  Hypertension: yes Hyperlipidemia. Yes, on statin.   Current / Home Diabetic regimen includes: Tresiba  5- 6 units at supper time.  Novolog  4-6 units with breakfast and lunch, not with supper. Jardiance  25 mg daily. Metformin  XR 500 mg 2 tabs two times a day with meals.  Glimepiride  2 mg daily. ? 1/2 tablet.   Prior diabetic medications: Januvia , Rybelsus , Actos , Invokana  in the past.  Glycemic data:    CONTINUOUS GLUCOSE MONITORING SYSTEM (CGMS) INTERPRETATION: At today's visit, we reviewed CGM downloads. The full report is scanned in the media. Reviewing the CGM trends, blood glucose are as follows:  FreeStyle Libre 3 CGM-  Sensor Download (Sensor download was reviewed and summarized below.) Dates: October 30 to November 12 , 2025 for 14 days Glucose Management Indicator: 6.9%  % data captured: 95%    Previous :    Interpretation: Mostly acceptable blood sugar with rare mild hypoglycemia in the morning and overnight with blood sugar in upper 60s.  1 episode of hyperglycemia  related to meals with blood sugar up to 300 range otherwise no concerning hyperglycemia.  Mostly acceptable blood sugar with meals and in between the meals and overnight.  Hypoglycemia: Patient has no hypoglycemic episodes. Patient has hypoglycemia awareness.  Factors modifying glucose control: 1.  Diabetic diet assessment: 3 meals a day.  2.  Staying active or exercising: No formal exercise.  Some yard work.  3.  Medication compliance: compliant all of the time.  Interval history   CGM data as reviewed above.  Diabetes has been as reviewed and noted above.  Hemoglobin A1c today improved to 7.3%.  He is not sure about if he is taking glimepiride  1 tablet or half tablet.  Asked patient to check at home and inform us .  No other complaints today.   REVIEW OF SYSTEMS As per history of present illness.   PAST MEDICAL HISTORY: Past Medical History:  Diagnosis Date   Diabetes (HCC)    Dyslipidemia     PAST SURGICAL HISTORY: Past Surgical History:  Procedure Laterality Date   CORONARY STENT INTERVENTION N/A 12/02/2023   Procedure: CORONARY STENT INTERVENTION;  Surgeon: Anner Alm ORN, MD;  Location: [facility] INVASIVE CV LAB;  Service: Cardiovascular;  Laterality: N/A;   LEFT HEART CATH AND CORONARY ANGIOGRAPHY N/A 11/25/2023   Procedure: LEFT HEART CATH AND CORONARY ANGIOGRAPHY;  Surgeon: Anner Alm  W, MD;  Location: [facility] INVASIVE CV LAB;  Service: Cardiovascular;  Laterality: N/A;    ALLERGIES: Allergies  Allergen Reactions   Seasonal Ic [Octacosanol] Other (See Comments)    Congestion   Semaglutide      REFLUX     FAMILY HISTORY:  Family History  Problem Relation Age of Onset   Diabetes Mother     SOCIAL HISTORY: Social History   Socioeconomic History   Marital status: Married    Spouse name: Not on file   Number of children: Not on file   Years of education: Not on file   Highest education level: Not on file  Occupational History   Not on file  Tobacco Use    Smoking status: Former   Smokeless tobacco: Never  Substance and Sexual Activity   Alcohol use: Yes    Comment: occ or 1 per month   Drug use: Never   Sexual activity: Not on file  Other Topics Concern   Not on file  Social History Narrative   Not on file   Social Drivers of Health   Financial Resource Strain: Not on file  Food Insecurity: Not on file  Transportation Needs: Not on file  Physical Activity: Not on file  Stress: Not on file  Social Connections: Not on file    MEDICATIONS:  Current Outpatient Medications  Medication Sig Dispense Refill   aspirin  EC 81 MG tablet Take 81 mg by mouth at bedtime.     atorvastatin (LIPITOR) 40 MG tablet Take 40 mg by mouth daily.      empagliflozin  (JARDIANCE ) 25 MG TABS tablet Take 1 tablet by mouth daily 90 tablet 3   fluticasone (FLONASE) 50 MCG/ACT nasal spray Place 1 spray into both nostrils daily.     gabapentin  (NEURONTIN ) 100 MG capsule Take 1 capsule (100 mg total) by mouth at bedtime. 90 capsule 3   glimepiride  (AMARYL ) 2 MG tablet Take 1 tablet (2 mg total) by mouth daily with breakfast. 90 tablet 1   icosapent  Ethyl (VASCEPA ) 1 g capsule Take 2 capsules (2 g total) by mouth 2 (two) times daily. 120 capsule 11   insulin  aspart (NOVOLOG  FLEXPEN) 100 UNIT/ML FlexPen Inject 4-7 units into skin 3 times daily before meals. 15 mL 4   Insulin  Degludec FlexTouch 100 UNIT/ML SOPN Inject 6 Units into the skin daily. 15 mL 4   Insulin  Pen Needle (B-D UF III MINI PEN NEEDLES) 31G X 5 MM MISC USE WITH INSULIN  PEN/uses 3 to 4 a day 100 each 5   lisinopril (PRINIVIL,ZESTRIL) 5 MG tablet Take 5 mg by mouth daily.     metFORMIN  (GLUCOPHAGE -XR) 500 MG 24 hr tablet TAKE 2 TABLETS BY MOUTH TWICE DAILY WITH A MEAL 360 tablet 3   nitroGLYCERIN  (NITROSTAT ) 0.4 MG SL tablet Place 1 tablet (0.4 mg total) under the tongue every 5 (five) minutes as needed. 25 tablet 2   ticagrelor  (BRILINTA ) 90 MG TABS tablet Take 1 tablet (90 mg total) by mouth 2 (two)  times daily. 180 tablet 3   No current facility-administered medications for this visit.    PHYSICAL EXAM: Vitals:   11/03/24 0845  BP: 102/60  Pulse: 63  Resp: 16  SpO2: 97%  Weight: 191 lb 9.6 oz (86.9 kg)  Height: 5' 11 (1.803 m)     Body mass index is 26.72 kg/m.  Wt Readings from Last 3 Encounters:  11/03/24 191 lb 9.6 oz (86.9 kg)  08/03/24 190 lb (86.2 kg)  04/21/24  191 lb 9.3 oz (86.9 kg)    General: Well developed, well nourished male in no apparent distress.  HEENT: AT/[place], no external lesions.  Eyes: Conjunctiva clear and no icterus. Neck: Neck supple  Lungs: Respirations not labored Neurologic: Alert, oriented, normal speech Extremities / Skin: Dry.  Psychiatric: Does not appear depressed or anxious  Diabetic Foot Exam - Simple   No data filed    LABS Reviewed Lab Results  Component Value Date   HGBA1C 7.3 (A) 11/03/2024   HGBA1C 7.8 (A) 08/03/2024   HGBA1C 8.0 (H) 03/29/2024   Lab Results  Component Value Date   FRUCTOSAMINE 311 (H) 07/15/2023   FRUCTOSAMINE 350 (H) 03/04/2023   Lab Results  Component Value Date   CHOL 144 12/02/2023   HDL 31 (L) 12/02/2023   LDLCALC 65 12/02/2023   TRIG 238 (H) 12/02/2023   CHOLHDL 4.6 12/02/2023   Lab Results  Component Value Date   MICRALBCREAT 20 08/03/2024    Lab Results  Component Value Date   CREATININE 1.30 (H) 03/29/2024   Lab Results  Component Value Date   GFR 69.04 09/29/2023    ASSESSMENT / PLAN  1. Type 2 diabetes mellitus with diabetic polyneuropathy, without long-term current use of insulin  (HCC)     Diabetes Mellitus type 2, complicated by diabetic neuropathy and CAD - Diabetic status / severity: Uncontrolled, improving.  Lab Results  Component Value Date   HGBA1C 7.3 (A) 11/03/2024    - Hemoglobin A1c goal : <6.5%  Overall acceptable blood sugar on CGM, GMI on CGM 6.9%.  Most acceptable blood sugar  - Medications: See below. Diabetes regimen:   Continue Tresiba   5-6 units daily.  Adjust Novlog 4-7 units with meals three times a day, adjust based on meals size. Continue jardiance  25mg  daily. Metfromin XR 500 mg 2 tablets two times a day.  Currently on glimepiride  2 mg not sure about 1 or half tablet.  Asked patient to check at home and let us  know.  If he is taking half tablet, stop it and if it is still taking full tablet asked to take half tablet daily.  - Home glucose testing: CGM and check as needed. - Discussed/ Gave Hypoglycemia treatment plan.  # Consult : not required at this time.   # Annual urine for microalbuminuria/ creatinine ratio, no microalbuminuria currently, continue ACE/ARB /lisinopril and Jardiance .   Last  Lab Results  Component Value Date   MICRALBCREAT 20 08/03/2024     # Foot check nightly / neuropathy, continue gabapentin .  # Annual dilated diabetic eye exams.   - Diet: Make healthy diabetic food choices - Life style / activity / exercise: Discussed.  2. Blood pressure  -  BP Readings from Last 1 Encounters:  11/03/24 102/60    - Control is in target.  - No change in current plans.  3. Lipid status / Hyperlipidemia - Last  Lab Results  Component Value Date   LDLCALC 65 12/02/2023   - Continue atorvastatin 40 mg daily.  Managed by primary care provider.  Diagnoses and all orders for this visit:  Type 2 diabetes mellitus with diabetic polyneuropathy, without long-term current use of insulin  (HCC) -     POCT glycosylated hemoglobin (Hb A1C)    DISPOSITION Follow up in clinic in 3  months suggested.    All questions answered and patient verbalized understanding of the plan.  Elanor Cale, MD [facility] Endocrinology [place] [facility] [address] [place], [address] [place],  [place] [address] Phone # [phone]  At least part of this note was generated using voice recognition software. Inadvertent word errors may have occurred, which were not recognized during the proofreading process.

## 2024-11-11 DIAGNOSIS — E113312 Type 2 diabetes mellitus with moderate nonproliferative diabetic retinopathy with macular edema, left eye: Secondary | ICD-10-CM | POA: Diagnosis not present

## 2024-11-11 DIAGNOSIS — H52223 Regular astigmatism, bilateral: Secondary | ICD-10-CM | POA: Diagnosis not present

## 2024-11-11 DIAGNOSIS — H524 Presbyopia: Secondary | ICD-10-CM | POA: Diagnosis not present

## 2024-11-11 DIAGNOSIS — H04123 Dry eye syndrome of bilateral lacrimal glands: Secondary | ICD-10-CM | POA: Diagnosis not present

## 2024-11-11 DIAGNOSIS — H5213 Myopia, bilateral: Secondary | ICD-10-CM | POA: Diagnosis not present

## 2024-11-11 DIAGNOSIS — H25813 Combined forms of age-related cataract, bilateral: Secondary | ICD-10-CM | POA: Diagnosis not present

## 2024-12-06 DIAGNOSIS — E113413 Type 2 diabetes mellitus with severe nonproliferative diabetic retinopathy with macular edema, bilateral: Secondary | ICD-10-CM | POA: Diagnosis not present

## 2024-12-06 DIAGNOSIS — H43823 Vitreomacular adhesion, bilateral: Secondary | ICD-10-CM | POA: Diagnosis not present

## 2024-12-06 DIAGNOSIS — H35372 Puckering of macula, left eye: Secondary | ICD-10-CM | POA: Diagnosis not present

## 2024-12-06 DIAGNOSIS — H2513 Age-related nuclear cataract, bilateral: Secondary | ICD-10-CM | POA: Diagnosis not present

## 2024-12-13 ENCOUNTER — Other Ambulatory Visit: Payer: Self-pay | Admitting: Nurse Practitioner

## 2025-01-03 ENCOUNTER — Ambulatory Visit: Admitting: Cardiology

## 2025-01-03 ENCOUNTER — Encounter: Payer: Self-pay | Admitting: Cardiology

## 2025-01-03 VITALS — BP 112/52 | HR 60 | Ht 71.0 in | Wt 194.0 lb

## 2025-01-03 DIAGNOSIS — E781 Pure hyperglyceridemia: Secondary | ICD-10-CM | POA: Diagnosis not present

## 2025-01-03 DIAGNOSIS — E119 Type 2 diabetes mellitus without complications: Secondary | ICD-10-CM | POA: Insufficient documentation

## 2025-01-03 DIAGNOSIS — I2510 Atherosclerotic heart disease of native coronary artery without angina pectoris: Secondary | ICD-10-CM | POA: Diagnosis not present

## 2025-01-03 DIAGNOSIS — I10 Essential (primary) hypertension: Secondary | ICD-10-CM | POA: Insufficient documentation

## 2025-01-03 MED ORDER — TICAGRELOR 60 MG PO TABS: 60.0000 mg | ORAL_TABLET | Freq: Two times a day (BID) | ORAL | 3 refills | Status: AC

## 2025-01-03 NOTE — Progress Notes (Signed)
 " Cardiology Office Note:  .   Date:  01/03/2025  ID:  Nathan Duncan, DOB 1951/04/04, MRN [id number] PCP: Patient, No Pcp Per  [facility] Providers Cardiologist:  Oneil Parchment, MD     History of Present Illness: .   Nathan Duncan is a 74 y.o. male Discussed the use of AI scribe  History of Present Illness Nathan Duncan is a 74 year old male with coronary artery disease and type 2 diabetes who presents for follow-up after stent placement.  He underwent a coronary CT scan on November 10, 2023, which revealed a coronary calcium score of 370, a subtotally occluded mid LAD at the origin of a first diagonal, and obstructive coronary disease in obtuse marginal one. FFR was 0.73 in obtuse marginal and 0.75 in the large diagonal. The right coronary artery was normal. Due to these findings, he underwent cardiac catheterizations on December 3 and December 02, 2023, resulting in stent placement to the first diagonal branch, LAD, osteoproximal ramus branch, and osteoproximal LAD into D1.  He currently takes aspirin  81 mg, atorvastatin 40 mg, and Vascepa  2 grams twice a day. His triglycerides were 238 in 2024, and LDL was 65. He also takes Brilinta . Aortic atherosclerosis was noted on CT.  He experiences no chest pain unless he eats certain foods and reports dizziness when moving too fast, which he attributes to an inner ear issue. He takes a small dose of lisinopril.  He has a history of type 2 diabetes and manages it with lifestyle changes, including stopping alcohol consumption. He uses a glucose monitoring patch, which he finds very helpful, checking it multiple times a day. He is tired of needles but manages his condition diligently.  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  [place]  [employer], met wife of 50 years  No CP  Studies  Reviewed: SABRA   EKG Interpretation Date/Time:  Monday January 03 2025 10:39:42 EST Ventricular Rate:  60 PR Interval:  196 QRS Duration:  76 QT Interval:  380 QTC Calculation: 380 R Axis:   18  Text Interpretation: Sinus rhythm with Premature atrial complexes Low voltage QRS When compared with ECG of 02-Dec-2023 09:13, Premature atrial complexes are now Present Confirmed by Parchment Oneil ([id number]) on 01/03/2025 10:48:49 AM    Results Labs Triglycerides (2024): 238 LDL (2024): 65 CRP (2024): 17  Radiology Coronary CT angiography (2023-11-10): Coronary calcium score 370, subtotal occlusion of mid left anterior descending artery at origin of first diagonal, obstructive coronary artery disease in obtuse marginal 1, normal right coronary artery, aortic atherosclerosis  Diagnostic Fractional flow reserve (FFR) (2023-11-10): FFR 0.73 in obtuse marginal, FFR 0.75 in large diagonal EKG: Sinus rhythm with premature atrial contractions, heart rate 60 (Independently interpreted)  Cath 12/02/23: Diagnostic Dominance: Right  Intervention      Risk Assessment/Calculations:            Physical Exam:   VS:  BP (!) 112/52 (BP Location: Left Arm, Patient Position: Sitting, Cuff Size: Normal)   Pulse 60   Ht 5' 11 (1.803 m)   Wt 194 lb (88 kg)   SpO2 98%   BMI 27.06 kg/m    Wt Readings from Last 3 Encounters:  01/03/25 194 lb (88 kg)  11/03/24 191 lb 9.6 oz (86.9 kg)  08/03/24 190 lb (86.2 kg)    GEN: Well  nourished, well developed in no acute distress NECK: No JVD; No carotid bruits CARDIAC: RRR, no murmurs, no rubs, no gallops RESPIRATORY:  Clear to auscultation without rales, wheezing or rhonchi  ABDOMEN: Soft, non-tender, non-distended EXTREMITIES:  No edema; No deformity   ASSESSMENT AND PLAN: .    Assessment and Plan Assessment & Plan Coronary artery disease with coronary stents Coronary artery disease with stents placed in the LAD, first diagonal branch, and ramus branch.  Over a year post-stenting, maintenance dosing of Brilinta  is appropriate. Aspirin  is to be discontinued to minimize bleeding risks. - Discontinued aspirin  81 mg daily. - Decreased Brilinta  to 60 mg twice daily for maintenance dosing.  Aortic atherosclerosis Noted on CT scan.  Type 2 diabetes mellitus Managed with current medications. Blood pressure is well-controlled with lisinopril for renal protection.  Hypertriglyceridemia Triglycerides were 238 mg/dL in 7975. Currently managed with atorvastatin and Vascepa .  Primary hypertension Blood pressure is well-controlled with lisinopril. No additional antihypertensive medications are needed. - Continue lisinopril for renal protection.         Dispo: 1 yr  Signed, Oneil Parchment, MD  "

## 2025-01-03 NOTE — Patient Instructions (Signed)
 Medication Instructions:  Please discontinue your Aspirin . Decrease Brilinta  to 60 mg one tablet twice a day. Continue all other medications as listed.  *If you need a refill on your cardiac medications before your next appointment, please call your pharmacy*  Follow-Up: At [facility], you and your health needs are our priority.  As part of our continuing mission to provide you with exceptional heart care, our providers are all part of one team.  This team includes your primary Cardiologist (physician) and Advanced Practice Providers or APPs (Physician Assistants and Nurse Practitioners) who all work together to provide you with the care you need, when you need it.  Your next appointment:   1 year(s)  Provider:   Oneil Parchment, MD    We recommend signing up for the patient portal called MyChart.  Sign up information is provided on this After Visit Summary.  MyChart is used to connect with patients for Virtual Visits (Telemedicine).  Patients are able to view lab/test results, encounter notes, upcoming appointments, etc.  Non-urgent messages can be sent to your provider as well.   To learn more about what you can do with MyChart, go to [email or web address].

## 2025-01-10 ENCOUNTER — Other Ambulatory Visit: Payer: Self-pay | Admitting: Endocrinology

## 2025-01-20 ENCOUNTER — Telehealth: Payer: Self-pay | Admitting: Cardiology

## 2025-01-20 ENCOUNTER — Encounter: Payer: Self-pay | Admitting: Cardiology

## 2025-01-20 NOTE — Telephone Encounter (Signed)
 Patient spouse called stating patient has had two stents and was told not do any shoveling. Please advise

## 2025-01-24 ENCOUNTER — Other Ambulatory Visit: Payer: Self-pay | Admitting: Endocrinology

## 2025-01-24 DIAGNOSIS — E1169 Type 2 diabetes mellitus with other specified complication: Secondary | ICD-10-CM

## 2025-02-08 ENCOUNTER — Ambulatory Visit: Admitting: Endocrinology
